# Patient Record
Sex: Female | Born: 1970 | Race: White | Hispanic: No | Marital: Single | Smoking: Never smoker
Health system: Southern US, Community
[De-identification: ages and names within clinical notes are randomized; demographics above are authoritative.]

## PROBLEM LIST (undated history)

## (undated) DIAGNOSIS — M722 Plantar fascial fibromatosis: Secondary | ICD-10-CM

## (undated) DIAGNOSIS — R739 Hyperglycemia, unspecified: Secondary | ICD-10-CM

## (undated) DIAGNOSIS — L408 Other psoriasis: Secondary | ICD-10-CM

## (undated) DIAGNOSIS — F329 Major depressive disorder, single episode, unspecified: Secondary | ICD-10-CM

## (undated) DIAGNOSIS — E139 Other specified diabetes mellitus without complications: Secondary | ICD-10-CM

## (undated) DIAGNOSIS — R519 Headache, unspecified: Secondary | ICD-10-CM

## (undated) DIAGNOSIS — F419 Anxiety disorder, unspecified: Secondary | ICD-10-CM

## (undated) DIAGNOSIS — R87613 High grade squamous intraepithelial lesion on cytologic smear of cervix (HGSIL): Secondary | ICD-10-CM

## (undated) DIAGNOSIS — F411 Generalized anxiety disorder: Secondary | ICD-10-CM

## (undated) DIAGNOSIS — K219 Gastro-esophageal reflux disease without esophagitis: Secondary | ICD-10-CM

## (undated) DIAGNOSIS — E785 Hyperlipidemia, unspecified: Secondary | ICD-10-CM

## (undated) DIAGNOSIS — I10 Essential (primary) hypertension: Secondary | ICD-10-CM

## (undated) DIAGNOSIS — G4730 Sleep apnea, unspecified: Secondary | ICD-10-CM

## (undated) DIAGNOSIS — C4359 Malignant melanoma of other part of trunk: Secondary | ICD-10-CM

## (undated) DIAGNOSIS — R51 Headache: Secondary | ICD-10-CM

## (undated) DIAGNOSIS — F32A Depression, unspecified: Secondary | ICD-10-CM

## (undated) DIAGNOSIS — D500 Iron deficiency anemia secondary to blood loss (chronic): Secondary | ICD-10-CM

## (undated) DIAGNOSIS — E669 Obesity, unspecified: Secondary | ICD-10-CM

## (undated) HISTORY — DX: Essential (primary) hypertension: I10

## (undated) HISTORY — DX: Hyperglycemia, unspecified: R73.9

## (undated) HISTORY — DX: Anxiety disorder, unspecified: F41.9

## (undated) HISTORY — DX: Other psoriasis: L40.8

## (undated) HISTORY — DX: Plantar fascial fibromatosis: M72.2

## (undated) HISTORY — DX: High grade squamous intraepithelial lesion on cytologic smear of cervix (HGSIL): R87.613

## (undated) HISTORY — DX: Depression, unspecified: F32.A

## (undated) HISTORY — DX: Gastro-esophageal reflux disease without esophagitis: K21.9

## (undated) HISTORY — DX: Obesity, unspecified: E66.9

## (undated) HISTORY — DX: Major depressive disorder, single episode, unspecified: F32.9

## (undated) HISTORY — DX: Sleep apnea, unspecified: G47.30

## (undated) HISTORY — DX: Headache: R51

## (undated) HISTORY — DX: Malignant melanoma of other part of trunk: C43.59

## (undated) HISTORY — DX: Generalized anxiety disorder: F41.1

## (undated) HISTORY — DX: Other specified diabetes mellitus without complications: E13.9

## (undated) HISTORY — DX: Hyperlipidemia, unspecified: E78.5

## (undated) HISTORY — DX: Headache, unspecified: R51.9

## (undated) HISTORY — DX: Iron deficiency anemia secondary to blood loss (chronic): D50.0

---

## 1998-10-03 ENCOUNTER — Emergency Department (HOSPITAL_COMMUNITY): Admission: EM | Admit: 1998-10-03 | Discharge: 1998-10-03 | Payer: Self-pay | Admitting: Emergency Medicine

## 1999-04-07 ENCOUNTER — Other Ambulatory Visit: Admission: RE | Admit: 1999-04-07 | Discharge: 1999-04-07 | Payer: Self-pay | Admitting: Gynecology

## 2002-02-21 ENCOUNTER — Emergency Department (HOSPITAL_COMMUNITY): Admission: EM | Admit: 2002-02-21 | Discharge: 2002-02-21 | Payer: Self-pay | Admitting: Emergency Medicine

## 2003-08-15 ENCOUNTER — Other Ambulatory Visit: Admission: RE | Admit: 2003-08-15 | Discharge: 2003-08-15 | Payer: Self-pay | Admitting: Gynecology

## 2004-12-17 ENCOUNTER — Emergency Department (HOSPITAL_COMMUNITY): Admission: EM | Admit: 2004-12-17 | Discharge: 2004-12-17 | Payer: Self-pay | Admitting: Family Medicine

## 2005-01-04 ENCOUNTER — Emergency Department (HOSPITAL_COMMUNITY): Admission: EM | Admit: 2005-01-04 | Discharge: 2005-01-04 | Payer: Self-pay | Admitting: Family Medicine

## 2005-05-06 ENCOUNTER — Ambulatory Visit: Payer: Self-pay | Admitting: Family Medicine

## 2005-05-07 ENCOUNTER — Ambulatory Visit: Payer: Self-pay | Admitting: *Deleted

## 2005-12-03 ENCOUNTER — Ambulatory Visit: Payer: Self-pay | Admitting: Family Medicine

## 2006-01-21 ENCOUNTER — Emergency Department (HOSPITAL_COMMUNITY): Admission: EM | Admit: 2006-01-21 | Discharge: 2006-01-21 | Payer: Self-pay | Admitting: Family Medicine

## 2006-07-21 ENCOUNTER — Ambulatory Visit: Payer: Self-pay | Admitting: Family Medicine

## 2006-07-21 ENCOUNTER — Encounter (INDEPENDENT_AMBULATORY_CARE_PROVIDER_SITE_OTHER): Payer: Self-pay | Admitting: *Deleted

## 2006-10-14 ENCOUNTER — Ambulatory Visit: Payer: Self-pay | Admitting: Family Medicine

## 2006-11-01 ENCOUNTER — Ambulatory Visit: Payer: Self-pay | Admitting: Family Medicine

## 2006-11-21 ENCOUNTER — Encounter (INDEPENDENT_AMBULATORY_CARE_PROVIDER_SITE_OTHER): Payer: Self-pay | Admitting: *Deleted

## 2006-11-21 ENCOUNTER — Ambulatory Visit: Payer: Self-pay | Admitting: Family Medicine

## 2006-12-19 ENCOUNTER — Ambulatory Visit: Payer: Self-pay | Admitting: Family Medicine

## 2007-01-05 ENCOUNTER — Ambulatory Visit (HOSPITAL_COMMUNITY): Admission: RE | Admit: 2007-01-05 | Discharge: 2007-01-05 | Payer: Self-pay | Admitting: Family Medicine

## 2007-02-23 ENCOUNTER — Ambulatory Visit: Payer: Self-pay | Admitting: Obstetrics & Gynecology

## 2007-02-23 ENCOUNTER — Other Ambulatory Visit: Admission: RE | Admit: 2007-02-23 | Discharge: 2007-02-23 | Payer: Self-pay | Admitting: Obstetrics and Gynecology

## 2007-03-09 ENCOUNTER — Ambulatory Visit: Payer: Self-pay | Admitting: *Deleted

## 2007-04-27 ENCOUNTER — Emergency Department (HOSPITAL_COMMUNITY): Admission: EM | Admit: 2007-04-27 | Discharge: 2007-04-27 | Payer: Self-pay | Admitting: Family Medicine

## 2007-06-15 ENCOUNTER — Ambulatory Visit: Payer: Self-pay | Admitting: Gynecology

## 2007-06-15 ENCOUNTER — Encounter (INDEPENDENT_AMBULATORY_CARE_PROVIDER_SITE_OTHER): Payer: Self-pay | Admitting: Gynecology

## 2007-06-29 ENCOUNTER — Ambulatory Visit: Payer: Self-pay | Admitting: Gynecology

## 2007-08-18 ENCOUNTER — Telehealth (INDEPENDENT_AMBULATORY_CARE_PROVIDER_SITE_OTHER): Payer: Self-pay | Admitting: *Deleted

## 2007-08-22 ENCOUNTER — Ambulatory Visit: Payer: Self-pay | Admitting: Family Medicine

## 2007-08-22 DIAGNOSIS — I10 Essential (primary) hypertension: Secondary | ICD-10-CM | POA: Insufficient documentation

## 2007-08-22 DIAGNOSIS — F411 Generalized anxiety disorder: Secondary | ICD-10-CM

## 2007-08-22 DIAGNOSIS — K219 Gastro-esophageal reflux disease without esophagitis: Secondary | ICD-10-CM | POA: Insufficient documentation

## 2007-08-22 DIAGNOSIS — L408 Other psoriasis: Secondary | ICD-10-CM

## 2007-08-22 DIAGNOSIS — R87613 High grade squamous intraepithelial lesion on cytologic smear of cervix (HGSIL): Secondary | ICD-10-CM | POA: Insufficient documentation

## 2007-08-22 HISTORY — DX: Other psoriasis: L40.8

## 2007-08-22 HISTORY — DX: Essential (primary) hypertension: I10

## 2007-08-22 HISTORY — DX: Generalized anxiety disorder: F41.1

## 2007-08-22 HISTORY — DX: Gastro-esophageal reflux disease without esophagitis: K21.9

## 2007-08-22 HISTORY — DX: High grade squamous intraepithelial lesion on cytologic smear of cervix (HGSIL): R87.613

## 2007-08-23 ENCOUNTER — Encounter (INDEPENDENT_AMBULATORY_CARE_PROVIDER_SITE_OTHER): Payer: Self-pay | Admitting: *Deleted

## 2007-09-13 ENCOUNTER — Ambulatory Visit: Payer: Self-pay | Admitting: Obstetrics & Gynecology

## 2007-10-05 ENCOUNTER — Ambulatory Visit: Payer: Self-pay | Admitting: Obstetrics & Gynecology

## 2007-10-05 ENCOUNTER — Other Ambulatory Visit: Admission: RE | Admit: 2007-10-05 | Discharge: 2007-10-05 | Payer: Self-pay | Admitting: Obstetrics and Gynecology

## 2007-10-19 ENCOUNTER — Ambulatory Visit: Payer: Self-pay | Admitting: Obstetrics and Gynecology

## 2007-12-07 HISTORY — PX: LEEP: SHX91

## 2008-01-24 ENCOUNTER — Ambulatory Visit: Payer: Self-pay | Admitting: Family Medicine

## 2008-01-24 DIAGNOSIS — C4359 Malignant melanoma of other part of trunk: Secondary | ICD-10-CM

## 2008-01-24 HISTORY — DX: Malignant melanoma of other part of trunk: C43.59

## 2008-01-24 LAB — CONVERTED CEMR LAB
ALT: 27 units/L (ref 0–35)
AST: 20 units/L (ref 0–37)
Albumin: 4.5 g/dL (ref 3.5–5.2)
Alkaline Phosphatase: 56 units/L (ref 39–117)
Basophils Absolute: 0 10*3/uL (ref 0.0–0.1)
Basophils Relative: 0 % (ref 0–1)
CO2: 25 meq/L (ref 19–32)
Calcium: 9.7 mg/dL (ref 8.4–10.5)
Chloride: 104 meq/L (ref 96–112)
Creatinine, Ser: 0.57 mg/dL (ref 0.40–1.20)
Eosinophils Absolute: 0.2 10*3/uL (ref 0.0–0.7)
Eosinophils Relative: 2 % (ref 0–5)
HCT: 42.9 % (ref 36.0–46.0)
HDL: 60 mg/dL (ref >39)
Hemoglobin: 13.8 g/dL (ref 12.0–15.0)
MCHC: 32.2 g/dL (ref 30.0–36.0)
MCV: 97.3 fL (ref 78.0–100.0)
Platelets: 295 10*3/uL (ref 150–400)
RBC: 4.41 M/uL (ref 3.87–5.11)
Sodium: 140 meq/L (ref 135–145)
TSH: 1.623 microintl units/mL (ref 0.350–5.50)
Total CHOL/HDL Ratio: 3.4
Triglycerides: 101 mg/dL (ref <150)
VLDL: 20 mg/dL (ref 0–40)
WBC: 6.4 10*3/uL (ref 4.0–10.5)

## 2008-02-06 ENCOUNTER — Emergency Department (HOSPITAL_COMMUNITY): Admission: EM | Admit: 2008-02-06 | Discharge: 2008-02-06 | Payer: Self-pay | Admitting: Family Medicine

## 2008-02-14 ENCOUNTER — Telehealth (INDEPENDENT_AMBULATORY_CARE_PROVIDER_SITE_OTHER): Payer: Self-pay | Admitting: Family Medicine

## 2008-03-12 ENCOUNTER — Ambulatory Visit: Payer: Self-pay | Admitting: Family Medicine

## 2008-03-18 ENCOUNTER — Telehealth (INDEPENDENT_AMBULATORY_CARE_PROVIDER_SITE_OTHER): Payer: Self-pay | Admitting: Family Medicine

## 2008-03-22 ENCOUNTER — Ambulatory Visit: Payer: Self-pay | Admitting: Nurse Practitioner

## 2008-03-22 DIAGNOSIS — J019 Acute sinusitis, unspecified: Secondary | ICD-10-CM

## 2008-04-04 ENCOUNTER — Ambulatory Visit: Payer: Self-pay | Admitting: Obstetrics and Gynecology

## 2008-04-04 ENCOUNTER — Encounter (INDEPENDENT_AMBULATORY_CARE_PROVIDER_SITE_OTHER): Payer: Self-pay | Admitting: Family Medicine

## 2008-04-17 ENCOUNTER — Telehealth (INDEPENDENT_AMBULATORY_CARE_PROVIDER_SITE_OTHER): Payer: Self-pay | Admitting: Nurse Practitioner

## 2008-05-21 ENCOUNTER — Encounter (INDEPENDENT_AMBULATORY_CARE_PROVIDER_SITE_OTHER): Payer: Self-pay | Admitting: Family Medicine

## 2008-05-30 ENCOUNTER — Telehealth (INDEPENDENT_AMBULATORY_CARE_PROVIDER_SITE_OTHER): Payer: Self-pay | Admitting: Family Medicine

## 2008-06-04 ENCOUNTER — Telehealth (INDEPENDENT_AMBULATORY_CARE_PROVIDER_SITE_OTHER): Payer: Self-pay | Admitting: Family Medicine

## 2008-06-12 ENCOUNTER — Encounter: Payer: Self-pay | Admitting: Obstetrics and Gynecology

## 2008-06-12 ENCOUNTER — Ambulatory Visit: Payer: Self-pay | Admitting: Obstetrics and Gynecology

## 2008-07-01 ENCOUNTER — Ambulatory Visit: Payer: Self-pay | Admitting: Family Medicine

## 2008-07-01 DIAGNOSIS — M722 Plantar fascial fibromatosis: Secondary | ICD-10-CM

## 2008-07-01 HISTORY — DX: Plantar fascial fibromatosis: M72.2

## 2008-07-02 ENCOUNTER — Encounter (INDEPENDENT_AMBULATORY_CARE_PROVIDER_SITE_OTHER): Payer: Self-pay | Admitting: Family Medicine

## 2008-07-09 ENCOUNTER — Ambulatory Visit: Payer: Self-pay | Admitting: Family Medicine

## 2008-07-16 ENCOUNTER — Encounter (INDEPENDENT_AMBULATORY_CARE_PROVIDER_SITE_OTHER): Payer: Self-pay | Admitting: Family Medicine

## 2008-09-02 ENCOUNTER — Ambulatory Visit: Payer: Self-pay | Admitting: Family Medicine

## 2008-10-08 ENCOUNTER — Ambulatory Visit: Payer: Self-pay | Admitting: Family Medicine

## 2008-11-21 ENCOUNTER — Telehealth (INDEPENDENT_AMBULATORY_CARE_PROVIDER_SITE_OTHER): Payer: Self-pay | Admitting: *Deleted

## 2008-12-06 HISTORY — PX: SKIN CANCER EXCISION: SHX779

## 2008-12-26 ENCOUNTER — Encounter: Payer: Self-pay | Admitting: Obstetrics and Gynecology

## 2008-12-26 ENCOUNTER — Ambulatory Visit: Payer: Self-pay | Admitting: Obstetrics and Gynecology

## 2008-12-26 LAB — CONVERTED CEMR LAB
Hemoglobin: 12.4 g/dL (ref 12.0–15.0)
MCV: 86.2 fL (ref 78.0–100.0)
Platelets: 345 10*3/uL (ref 150–400)
RBC: 4.28 M/uL (ref 3.87–5.11)
WBC: 7.6 10*3/uL (ref 4.0–10.5)

## 2008-12-30 ENCOUNTER — Ambulatory Visit: Payer: Self-pay | Admitting: Family Medicine

## 2008-12-30 DIAGNOSIS — K089 Disorder of teeth and supporting structures, unspecified: Secondary | ICD-10-CM | POA: Insufficient documentation

## 2008-12-31 ENCOUNTER — Ambulatory Visit (HOSPITAL_COMMUNITY): Admission: RE | Admit: 2008-12-31 | Discharge: 2008-12-31 | Payer: Self-pay | Admitting: Family Medicine

## 2009-01-09 ENCOUNTER — Ambulatory Visit: Payer: Self-pay | Admitting: Obstetrics and Gynecology

## 2009-06-10 ENCOUNTER — Encounter (INDEPENDENT_AMBULATORY_CARE_PROVIDER_SITE_OTHER): Payer: Self-pay | Admitting: Family Medicine

## 2009-06-10 ENCOUNTER — Ambulatory Visit: Payer: Self-pay | Admitting: Internal Medicine

## 2009-06-12 ENCOUNTER — Ambulatory Visit: Payer: Self-pay | Admitting: Obstetrics and Gynecology

## 2010-01-21 ENCOUNTER — Ambulatory Visit: Payer: Self-pay | Admitting: Obstetrics and Gynecology

## 2010-01-21 LAB — CONVERTED CEMR LAB: Pap Smear: NEGATIVE

## 2010-07-27 ENCOUNTER — Ambulatory Visit: Payer: Self-pay | Admitting: Obstetrics & Gynecology

## 2010-11-01 ENCOUNTER — Emergency Department (HOSPITAL_COMMUNITY)
Admission: EM | Admit: 2010-11-01 | Discharge: 2010-11-01 | Payer: Self-pay | Source: Home / Self Care | Admitting: Family Medicine

## 2010-12-27 ENCOUNTER — Encounter: Payer: Self-pay | Admitting: *Deleted

## 2011-03-11 ENCOUNTER — Ambulatory Visit: Payer: Self-pay | Admitting: Advanced Practice Midwife

## 2011-04-12 ENCOUNTER — Other Ambulatory Visit (HOSPITAL_COMMUNITY)
Admission: RE | Admit: 2011-04-12 | Discharge: 2011-04-12 | Disposition: A | Discharge status: Home / Self Care | Payer: Self-pay | Source: Ambulatory Visit | Attending: Obstetrics & Gynecology | Admitting: Obstetrics & Gynecology

## 2011-04-12 ENCOUNTER — Other Ambulatory Visit: Payer: Self-pay | Admitting: Advanced Practice Midwife

## 2011-04-12 ENCOUNTER — Ambulatory Visit (INDEPENDENT_AMBULATORY_CARE_PROVIDER_SITE_OTHER): Payer: Self-pay | Admitting: Advanced Practice Midwife

## 2011-04-12 DIAGNOSIS — N949 Unspecified condition associated with female genital organs and menstrual cycle: Secondary | ICD-10-CM

## 2011-04-12 DIAGNOSIS — Z01419 Encounter for gynecological examination (general) (routine) without abnormal findings: Secondary | ICD-10-CM | POA: Insufficient documentation

## 2011-04-13 NOTE — Group Therapy Note (Unsigned)
NAME:  Janet [name], Janet [name] NO.:  [id number]  MEDICAL RECORD NO.:  [id number]           PATIENT TYPE:  A  LOCATION:  [facility]                   FACILITY:  [facility]  PHYSICIAN:  Wynelle Bourgeois, CNM    DATE OF BIRTH:  1971-08-15  DATE OF SERVICE:  04/12/2011                                 CLINIC NOTE  HISTORY OF PRESENT ILLNESS:  This is a 40 year old gravida 1, para 1 female who is here today for her annual exam.  She has been seen previously for pelvic pain.  Her last visit for that was in August 2011 where Dr. Claudius Sis saw her and ordered her an ultrasound to evaluate her pelvis.  She had previously also had problems with menorrhagia and was placed on oral contraceptives by Dr. Okey Dupre and has had good results from that.  Her bleeding has lessened but she continues to have chronic pain every day throughout the month.  She did not go for her ultrasound in August, because she felt like she had just had one and did not need another one.  She reports normal bowel movements and no constipation or diarrhea.  She has no fever or other symptoms.  ALLERGIES:  PENICILLIN.  LMP April 05, 2011.  PHYSICAL EXAMINATION:  VITAL SIGNS:  Temperature is 98.4, pulse 96, blood pressure 153/101, and weight 259.6. CARDIOVASCULAR:  Regular rate and rhythm with no ectopy. LUNGS:  Clear to auscultation bilaterally. ABDOMEN:  Soft with mild tenderness in the lower abdomen bilaterally. There are no masses appreciated. GU:  EGBUS within normal limits.  Vagina clear without lesions.  Cervix is closed and located in the upper right area of her vagina.  It is somewhat difficult to access.  Uterus is small and firm with no masses or tenderness.  Bilateral adnexa are not palpable secondary to habitus.  IMPRESSION AND PLAN: 41. A 40 year old female with menorrhagia, improved on OCPs. 2. Chronic pelvic pain.  We will reorder a pelvic ultrasound for     further evaluation.  Discussed patient's request  for possible     surgery either a laparoscopy to evaluate for endometriosis or     hysterectomy.  The patient is not sure if she wants to proceed with     these and we will need to apply for scholarship for finding.  I     discussed with her that she will need to speak with the physician     about whether or not these options would be reasonable for her.  I     asked her to quantify how debilitating her pain is and she could     not really do that. 3. History of abnormal Pap with LEEP surgery in 2008 and normal Pap     since then.  So Pap was sent today. 4. The patient declines STD testing. 5. Return in 2-3 weeks after ultrasound for evaluation of results and     discussion of possible future treatment.         ______________________________ Wynelle Bourgeois, CNM   MW/MEDQ  D:  04/12/2011  T:  04/13/2011  Job:  [id number]

## 2011-04-20 NOTE — Group Therapy Note (Signed)
NAME:  Janet [name], Janet [name] NO.:  [id number]   MEDICAL RECORD NO.:  [id number]          PATIENT TYPE:  WOC   LOCATION[facility]                   FACILITY:  [facility]   PHYSICIAN:  Argentina Donovan, MD        DATE OF BIRTH:  01-Feb-1971   DATE OF SERVICE:  12/26/2008                                  CLINIC NOTE   The patient is a 40 year old Caucasian female gravida 1, para 1-0-0-1  with a daughter age 5.  She is now presently not sexually active and  has not been for some time.  She stated that if she were to meet someone  to become involved she would not want any further children.  She comes  in today because since her LEEP in October of 2008 she has had extremely  heavy periods where she passes clots the size of golf balls.  It is not  every month but it is very frequent.  The patient gained quite a bit of  weight, she weighs 271 pounds at 5 feet 6 inches tall.  Her blood  pressure is 130/79.  Her pulse is 88 per minute.  The patient had a LEEP  for cervical dysplasia and her last Pap smear in July was ASCUS but no  reflex viral search was done so we repeated her Pap smear today.  On  examination, it was nonrevealing as far as her bleeding goes.  We are  going to get an ultrasound, get a CBC, have her come back in 2 weeks,  and then talk to her about hormonal treatment, a Mirena IUD versus  endometrial ablation.  She is not a smoker and has never been a smoker.   IMPRESSION:  1. Previous cervical dysplasia, Pap smear repeated.  2. Long-term menorrhagia, rule out anemia.           ______________________________  Argentina Donovan, MD     PR/MEDQ  D:  12/26/2008  T:  12/26/2008  Job:  [id number]

## 2011-04-20 NOTE — Group Therapy Note (Signed)
NAME:  Janet [name], Janet [name] NO.:  [id number]   MEDICAL RECORD NO.:  [id number]          PATIENT TYPE:  WOC   [facility]                   [facility]ACILITY[facility]   PHYSICIAN:  Argentina Donovan, MD        DATE OF BIRTH:  Oct 18, 1971   DATE OF SERVICE:                                  CLINIC NOTE   HISTORY:  The patient is a 40 year old white female gravida 1, para 1-0-  0-1 who had a LEEP done in  2008 and Pap smears since that time have  been normal.  However for a long period of time, she has had chronic  menometrorrhagia.  She now spots almost every day if she does not bleed.  Ultrasound previously was normal with a thin endometrium.  We talked  about the Mirena IUD versus endometrial ablation.  She applied for the  scholarship for the Mirena IUD.  They told her income was too high.  So  we talked about the possibility of either trying through the health  department or going for an endometrial ablation.  She is going to try  and get the financial help through the hospital for the endometrial  ablation. In the interim, we are going to try her at least.  She is a  nonsmoker on Loestrin FE and see if we can control the bleeding using  that hormone treatment.  We have talked about the Depo-Provera.  I  hesitate to give her that because of her weight which is 274 pounds.  With the exception of her problem with bleeding, she is in pretty good  health.  She does take Nexium daily for reflux, but takes almost no  other medication.  She constantly has to wear a pad.  She sometimes  bleeds heavily.  When she is not bleeding, she almost always is spotting  and that has been going on for several years.   IMPRESSION:  Chronic menometrorrhagia.           ______________________________  Argentina Donovan, MD     PR/MEDQ  D:  06/12/2009  T:  06/12/2009  Job:  [id number]

## 2011-04-20 NOTE — Group Therapy Note (Signed)
NAME:  SAVANNAHA, STONEROCK NO.:  [id number]   MEDICAL RECORD NO.:  [id number]          PATIENT TYPE:  WOC   [facility]                   [facility]ILITY[facility]   PHYSICIAN:  Argentina Donovan, MD        DATE OF BIRTH:  01-08-1971   DATE OF SERVICE:                                  CLINIC NOTE   The patient is a 40 year old gravida 1, para 1-0-0-1, please refer to  previous note.  She wants no more children.  We have evaluated her.  Her  Pap smear and LEEP has been normal.  Her CBC is normal.  Her ultrasound  is normal and has never been a smoker.  We discussed the various methods  of evaluating her and controlling her bleeding and I have discussed  specifically the Mirena IUD versus the endometrial ablation.  We will  give her information on both.  We are going to refill out the papers for  the IUD and then at her home and make a decision and let us know what  she wants to do.   IMPRESSION:  Chronic menorrhagia.           ______________________________  Argentina Donovan, MD     PR/MEDQ  D:  01/09/2009  T:  01/10/2009  Job:  [id number]

## 2011-04-22 ENCOUNTER — Ambulatory Visit (HOSPITAL_COMMUNITY): Admission: RE | Admit: 2011-04-22 | Payer: Self-pay | Source: Ambulatory Visit

## 2011-04-22 ENCOUNTER — Other Ambulatory Visit (HOSPITAL_COMMUNITY): Payer: Self-pay

## 2011-04-29 ENCOUNTER — Ambulatory Visit (HOSPITAL_COMMUNITY)
Admission: RE | Admit: 2011-04-29 | Discharge: 2011-04-29 | Disposition: A | Discharge status: Home / Self Care | Payer: Self-pay | Source: Ambulatory Visit | Attending: Advanced Practice Midwife | Admitting: Advanced Practice Midwife

## 2011-04-29 DIAGNOSIS — N949 Unspecified condition associated with female genital organs and menstrual cycle: Secondary | ICD-10-CM | POA: Insufficient documentation

## 2011-04-29 DIAGNOSIS — N838 Other noninflammatory disorders of ovary, fallopian tube and broad ligament: Secondary | ICD-10-CM | POA: Insufficient documentation

## 2011-05-20 ENCOUNTER — Ambulatory Visit (INDEPENDENT_AMBULATORY_CARE_PROVIDER_SITE_OTHER): Payer: Self-pay | Admitting: Obstetrics & Gynecology

## 2011-05-20 DIAGNOSIS — N949 Unspecified condition associated with female genital organs and menstrual cycle: Secondary | ICD-10-CM

## 2011-05-21 NOTE — Group Therapy Note (Unsigned)
NAME:  Janet [name], MATUSIK NO.:  [id number]  MEDICAL RECORD NO.:  [id number]           PATIENT TYPE:  A  [facility]                   [facility]ILITY[facility]  PHYSICIAN:  Jaynie Collins, MD     DATE OF BIRTH:  [birth year]  DATE OF SERVICE:  05/21/2011                                 CLINIC NOTE  HISTORY OF PRESENT ILLNESS:  The patient is a 40 year old, gravida 1, para 1 female, here today for follow-up of complaint of pelvic pain. The patient was last seen on Apr 12, 2011, and was referred for an ultrasound.  According to her note, she asked about laparoscopy to evaluate for endometriosis based on the ultrasound findings.  The patient returns to clinic today with similar complaints of pain.  She states it is a 4/10 lower abdominal pain, worse in the left lower quadrant and right lower quadrant.  She states she feels this pain constantly, worse in the morning upon awakening, and she has had this constant pain for the last 2 years.  She describes it is like a cramping pain.  The patient was put on combination OCPs 2 years ago for dysmenorrhea.  She states that the pain could be related to these and wanted to try a different kind of OCPs.  She reports normal bowel movements.  No constipation or diarrhea, and does not report any dysuria.  She states she is sexually active with one partner.  Denies dyspareunia.  ALLERGIES:  PENICILLIN.  Last menstrual period, May 2012.  PHYSICAL EXAMINATION:  VITAL SIGNS:  Temperature 98.4, pulse 90, blood pressure 150/94, weight 258.2, height 6 feet 5.5 inches.  IMPRESSION AND PLAN: 72. A 40 year old female with chronic pelvic pain.  Discuss the patient     that laparoscopy surgery for evaluation of endometriosis.  The     patient would like to try searching birth control methods first.     The patient has had multiple elevated blood pressures in clinic,     today was 150/94, in Apr 12, 2011 it was 153/101.  The patient was  explained the risks to have any uncontrolled hypertension and     taking estrogen containing pills such as stroke and thromboembolic     events.  The patient was last given a prescription for Provera and     HCTZ and told to follow up with her primary care provider. 2. The patient told to return after trial of new birth control methods     to see if further testing for endometriosis is needed.    ______________________________ Emeline Gins, PA   ______________________________ Jaynie Collins, MD   NS/MEDQ  D:  05/20/2011  T:  05/21/2011  Job:  [id number]

## 2011-07-03 ENCOUNTER — Other Ambulatory Visit: Payer: Self-pay | Admitting: Obstetrics & Gynecology

## 2011-08-30 LAB — POCT RAPID STREP A: Streptococcus, Group A Screen (Direct): POSITIVE — AB

## 2011-09-15 LAB — POCT PREGNANCY, URINE: Operator id: 148111

## 2011-09-16 LAB — POCT PREGNANCY, URINE
Operator id: 297281
Preg Test, Ur: NEGATIVE

## 2013-09-27 ENCOUNTER — Ambulatory Visit: Payer: Self-pay | Attending: Internal Medicine

## 2013-10-02 ENCOUNTER — Emergency Department (HOSPITAL_COMMUNITY)
Admission: EM | Admit: 2013-10-02 | Discharge: 2013-10-02 | Disposition: A | Discharge status: Home / Self Care | Payer: Self-pay | Attending: Emergency Medicine | Admitting: Emergency Medicine

## 2013-10-02 ENCOUNTER — Encounter (HOSPITAL_COMMUNITY): Payer: Self-pay | Admitting: Emergency Medicine

## 2013-10-02 ENCOUNTER — Emergency Department (HOSPITAL_COMMUNITY): Payer: Self-pay

## 2013-10-02 DIAGNOSIS — Z79899 Other long term (current) drug therapy: Secondary | ICD-10-CM | POA: Insufficient documentation

## 2013-10-02 DIAGNOSIS — Z880 Allergy status to penicillin: Secondary | ICD-10-CM | POA: Insufficient documentation

## 2013-10-02 DIAGNOSIS — R42 Dizziness and giddiness: Secondary | ICD-10-CM | POA: Insufficient documentation

## 2013-10-02 DIAGNOSIS — R51 Headache: Secondary | ICD-10-CM | POA: Insufficient documentation

## 2013-10-02 DIAGNOSIS — Z3202 Encounter for pregnancy test, result negative: Secondary | ICD-10-CM | POA: Insufficient documentation

## 2013-10-02 LAB — URINALYSIS, ROUTINE W REFLEX MICROSCOPIC: Ketones, ur: 40 mg/dL — AB

## 2013-10-02 LAB — URINE MICROSCOPIC-ADD ON

## 2013-10-02 LAB — PREGNANCY, URINE: Preg Test, Ur: NEGATIVE

## 2013-10-02 MED ORDER — DEXAMETHASONE SODIUM PHOSPHATE 4 MG/ML IJ SOLN
4.0000 mg | Freq: Once | INTRAMUSCULAR | Status: AC
  Administered 2013-10-02: 4 mg via INTRAVENOUS
  Filled 2013-10-02: qty 1

## 2013-10-02 MED ORDER — SODIUM CHLORIDE 0.9 % IV BOLUS (SEPSIS)
1000.0000 mL | Freq: Once | INTRAVENOUS | Status: AC
  Administered 2013-10-02: 1000 mL via INTRAVENOUS

## 2013-10-02 MED ORDER — DIPHENHYDRAMINE HCL 50 MG/ML IJ SOLN
25.0000 mg | Freq: Once | INTRAMUSCULAR | Status: AC
  Administered 2013-10-02: 25 mg via INTRAVENOUS
  Filled 2013-10-02: qty 1

## 2013-10-02 MED ORDER — HALOPERIDOL LACTATE 5 MG/ML IJ SOLN
2.5000 mg | Freq: Once | INTRAMUSCULAR | Status: AC
  Administered 2013-10-02: 2.5 mg via INTRAVENOUS
  Filled 2013-10-02: qty 1

## 2013-10-02 MED ORDER — ONDANSETRON HCL 4 MG/2ML IJ SOLN
4.0000 mg | Freq: Once | INTRAMUSCULAR | Status: AC
  Administered 2013-10-02: 4 mg via INTRAVENOUS
  Filled 2013-10-02: qty 2

## 2013-10-02 NOTE — ED Notes (Addendum)
Pt states she woke up at 0400 this morning with a severe HA. Pt states she feels like she could pass out. Pt states she was nauseous earlier but is not right now. Pt is tearful. Pt denies hx of migraines. Pt is A&O X4. Pt states she feels weak all over. Pt c/o diaphoresis all over since she woke up. Pt states she has full sensation in her extremities. Pt denies any vision changes.

## 2013-10-02 NOTE — ED Provider Notes (Signed)
CSN: [id number]     Arrival date & time 10/02/13  0630 History   First MD Initiated Contact with Patient 10/02/13 [phone]     Chief Complaint  Patient presents with  . Headache   (Consider location/radiation/quality/duration/timing/severity/associated sxs/prior Treatment) Patient is a 42 y.o. female presenting with headaches. The history is provided by the patient. No language interpreter was used.  Headache Pain location:  Generalized Quality:  Sharp Radiates to:  Does not radiate Severity currently:  10/10 Onset quality:  Sudden Chronicity:  New Similar to prior headaches: no   Context comment:  Awakened from sleep Associated symptoms: dizziness   Associated symptoms: no blurred vision, no diarrhea, no pain, no facial pain, no fever, no photophobia, no seizures and no vomiting    Pt is a 42 year old female who was awakened this morning with a headache that she describes as the "worst headache of my life". She denies any history of migraines, fever, chills or recent illness. She denies photophobia, visual changes or difficulty walking. She denies any weakness or focal deficits. She describes this as a generalized headache and has had accompanied dizziness when she stands up and initially felt like she might pass out this morning. She reports that her headache continues to be 10/10.    History reviewed. No pertinent past medical history. History reviewed. No pertinent past surgical history. No family history on file. History  Substance Use Topics  . Smoking status: Never Smoker   . Smokeless tobacco: Not on file  . Alcohol Use: No   OB History   Grav Para Term Preterm Abortions TAB SAB Ect Mult Living                 Review of Systems  Constitutional: Negative for fever and chills.  Eyes: Negative for blurred vision, photophobia and pain.  Gastrointestinal: Negative for vomiting and diarrhea.  Musculoskeletal: Negative for gait problem.  Neurological: Positive for dizziness  and headaches. Negative for seizures, facial asymmetry and weakness.  All other systems reviewed and are negative.    Allergies  Penicillins  Home Medications   Current Outpatient Rx  Name  Route  Sig  Dispense  Refill  . hydrochlorothiazide 25 MG tablet      TAKE ONE TABLET BY MOUTH EVERY DAY   30 tablet   3   . medroxyPROGESTERone (PROVERA) 10 MG tablet      TAKE ONE TABLET BY MOUTH EVERY DAY   30 tablet   2    BP 145/87  Pulse 86  Temp(Src) 97.8 F (36.6 C) (Oral)  Resp 20  Ht 5\' 7"  (1.702 m)  Wt 277 lb (125.646 kg)  BMI 43.37 kg/m2  SpO2 100%  LMP 09/25/2013 Physical Exam  Nursing note and vitals reviewed. Constitutional: She appears well-developed and well-nourished.  HENT:  Head: Normocephalic and atraumatic.  Mouth/Throat: Oropharynx is clear and moist.  Eyes: Conjunctivae and EOM are normal. Pupils are equal, round, and reactive to light.  Neck: Normal range of motion. Neck supple. No thyromegaly present.  Cardiovascular: Normal rate, regular rhythm, normal heart sounds and intact distal pulses.   Pulmonary/Chest: Effort normal and breath sounds normal. No respiratory distress. She has no wheezes.  Abdominal: Soft. Bowel sounds are normal. She exhibits no distension. There is no tenderness.  Musculoskeletal: Normal range of motion.  Lymphadenopathy:    She has no cervical adenopathy.  Neurological: She is alert. She has normal strength. No cranial nerve deficit or sensory deficit. Coordination normal. GCS  eye subscore is 4. GCS verbal subscore is 5. GCS motor subscore is 6.  Skin: Skin is warm and dry.  Psychiatric: She has a normal mood and affect. Her behavior is normal. Judgment and thought content normal.    ED Course  Procedures (including critical care time) Labs Review Labs Reviewed - No data to display Imaging Review No results found.  EKG Interpretation   None     7:53 AM Re-evaluated, headache 8/10. Will continue to monitor and give  IV fluid bolus.  9:31 AM Headache 1/10. Sitting up in bed, alert and conversational. VS stable.   MDM   1. Headache    Resolution of headache after meds and fluids. Afebrile, normal ROM of neck, no nuchal rigidity or signs of meningitis. No visual changes, photophobia or focal deficits. Good motor strength and coordination. Ambulatory in room before discharge. Discussed headache care with pt and reasons to return to ER. Pt is feeling much better and agrees to go home, rest and stay well hydrated today.      Irish Elders, NP 10/02/13 [phone]

## 2013-10-02 NOTE — ED Provider Notes (Signed)
Medical screening examination/treatment/procedure(s) were performed by non-physician practitioner and as supervising physician I was immediately available for consultation/collaboration.    Junius Argyle, MD 10/02/13 1531

## 2013-10-02 NOTE — ED Notes (Signed)
MD at bedside. 

## 2013-10-02 NOTE — ED Notes (Signed)
PA at bedside.

## 2013-11-02 ENCOUNTER — Ambulatory Visit: Payer: Self-pay

## 2013-11-19 ENCOUNTER — Ambulatory Visit: Payer: Self-pay | Admitting: Obstetrics & Gynecology

## 2014-05-30 ENCOUNTER — Encounter: Payer: Self-pay | Admitting: Family Medicine

## 2014-05-30 ENCOUNTER — Ambulatory Visit (INDEPENDENT_AMBULATORY_CARE_PROVIDER_SITE_OTHER): Payer: No Typology Code available for payment source | Admitting: Family Medicine

## 2014-05-30 VITALS — BP 144/98 | HR 83 | Temp 98.4°F | Ht 66.5 in | Wt 277.0 lb

## 2014-05-30 DIAGNOSIS — Z85820 Personal history of malignant melanoma of skin: Secondary | ICD-10-CM

## 2014-05-30 DIAGNOSIS — L408 Other psoriasis: Secondary | ICD-10-CM

## 2014-05-30 DIAGNOSIS — R87613 High grade squamous intraepithelial lesion on cytologic smear of cervix (HGSIL): Secondary | ICD-10-CM

## 2014-05-30 DIAGNOSIS — L409 Psoriasis, unspecified: Secondary | ICD-10-CM

## 2014-05-30 DIAGNOSIS — I10 Essential (primary) hypertension: Secondary | ICD-10-CM

## 2014-05-30 DIAGNOSIS — N946 Dysmenorrhea, unspecified: Secondary | ICD-10-CM

## 2014-05-30 DIAGNOSIS — D500 Iron deficiency anemia secondary to blood loss (chronic): Secondary | ICD-10-CM | POA: Insufficient documentation

## 2014-05-30 DIAGNOSIS — E669 Obesity, unspecified: Secondary | ICD-10-CM

## 2014-05-30 HISTORY — DX: Iron deficiency anemia secondary to blood loss (chronic): D50.0

## 2014-05-30 LAB — LIPID PANEL
CHOL/HDL RATIO: 3
CHOLESTEROL: 198 mg/dL (ref 0–200)
HDL: 60.8 mg/dL (ref >39.00)
LDL Cholesterol: 113 mg/dL — ABNORMAL HIGH (ref 0–99)
NonHDL: 137.2
TRIGLYCERIDES: 120 mg/dL (ref 0.0–149.0)
VLDL: 24 mg/dL (ref 0.0–40.0)

## 2014-05-30 LAB — BASIC METABOLIC PANEL
BUN: 12 mg/dL (ref 6–23)
CO2: 28 mEq/L (ref 19–32)
Calcium: 9.5 mg/dL (ref 8.4–10.5)
Chloride: 103 mEq/L (ref 96–112)
Creatinine, Ser: 0.6 mg/dL (ref 0.4–1.2)
GFR: 112.42 mL/min (ref >60.00)
GLUCOSE: 90 mg/dL (ref 70–99)
POTASSIUM: 4.1 meq/L (ref 3.5–5.1)
SODIUM: 139 meq/L (ref 135–145)

## 2014-05-30 LAB — CBC WITH DIFFERENTIAL/PLATELET
BASOS PCT: 0.4 % (ref 0.0–3.0)
Basophils Absolute: 0 10*3/uL (ref 0.0–0.1)
EOS PCT: 1.3 % (ref 0.0–5.0)
Eosinophils Absolute: 0.1 10*3/uL (ref 0.0–0.7)
HCT: 37 % (ref 36.0–46.0)
HEMOGLOBIN: 12.2 g/dL (ref 12.0–15.0)
LYMPHS PCT: 27.8 % (ref 12.0–46.0)
Lymphs Abs: 2.5 10*3/uL (ref 0.7–4.0)
MCHC: 32.9 g/dL (ref 30.0–36.0)
MCV: 86.3 fl (ref 78.0–100.0)
Monocytes Absolute: 0.6 10*3/uL (ref 0.1–1.0)
Monocytes Relative: 6.4 % (ref 3.0–12.0)
NEUTROS ABS: 5.8 10*3/uL (ref 1.4–7.7)
Neutrophils Relative %: 64.1 % (ref 43.0–77.0)
Platelets: 349 10*3/uL (ref 150.0–400.0)
RBC: 4.29 Mil/uL (ref 3.87–5.11)
RDW: 16.2 % — ABNORMAL HIGH (ref 11.5–15.5)
WBC: 9.1 10*3/uL (ref 4.0–10.5)

## 2014-05-30 LAB — HEMOGLOBIN A1C: Hgb A1c MFr Bld: 6 % (ref 4.6–6.5)

## 2014-05-30 LAB — TSH: TSH: 1.55 u[IU]/mL (ref 0.35–4.50)

## 2014-05-30 NOTE — Progress Notes (Signed)
Pre visit review using our clinic review tool, if applicable. No additional management support is needed unless otherwise documented below in the visit note. 

## 2014-05-30 NOTE — Progress Notes (Addendum)
No chief complaint on file.   HPI:  Janet [name] is here to establish care. Never had family doctor. She wants to check for diabetes as multiple family members and she feels tired frequently. Also has heavy menstrual bleeding and dysmenorrhea with pelvic pain for > 10 years with extensive workup per her report. Hx of LEEP. Last PCP and physical: has been doing free female g[facility] exams [facility] at [facility] - have been normal  Has the following chronic problems and concerns today:  Patient Active Problem List   Diagnosis Date Noted  . Anemia due to chronic blood loss - reports history of evaluation and told due to menorrhagia 05/30/2014  . 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 ANXIETY 08/22/2007  . HYPERTENSION, BENIGN ESSENTIAL 08/22/2007  . GERD 08/22/2007  . MICROSCOPIC HEMATURIA 08/22/2007  . PSORIASIS 08/22/2007  . ABFND PAP SMEAR HGSIL 08/22/2007   HTN: -reports prescribed medication in the past -does not takes this anymore -no cv exercise, diet is poor  Dysmenorrhea/hx abnormal pap/pelvic pain: -she wants to see private gyn practice for this -period monthly, heavy bleeding with large clots, fatigue  Anemia: -hx of determined to be heavy menstrual bleeding per her report, takes iron  GERD: -frequent, not taking any medication currently -denies weight loss, trouble swallowing, abd pain  Hx of Melanoma and psoriasis: -did used to go every year for evaluation, but has not been since last summer -she reports no changing or new moles   Health Maintenance:  ROS: See pertinent positives and negatives per HPI.  Past Medical History  Diagnosis Date  . Frequent headaches   . Skin cancer of anterior chest   . History of abnormal cervical Pap smear   . HYPERTENSION, BENIGN ESSENTIAL 08/22/2007    Qualifier: Diagnosis of  By: Radene Ou MD, [place]    . GERD 08/22/2007    Qualifier: Diagnosis of  By: Radene Ou MD, [place]    . MELANOMA, TRUNK 01/24/2008    Qualifier: Diagnosis of  By:  Radene Ou MD, [place]    . PLANTAR FASCIITIS, RIGHT 07/01/2008    Qualifier: Diagnosis of  By: Radene Ou MD, [place]    . PSORIASIS 08/22/2007    Qualifier: Diagnosis of  By: Radene Ou MD, [place]    . ABFND PAP SMEAR HGSIL 08/22/2007    Qualifier: Diagnosis of  By: Radene Ou MD, [place]    . ANXIETY 08/22/2007    Qualifier: Diagnosis of  By: Radene Ou MD, [place]    . Anemia due to chronic blood loss - reports history of evaluation and told due to menorrhagia 05/30/2014    Family History  Problem Relation Age of Onset  . Diabetes Mother   . Hypertension Mother   . Heart disease Father   . Hypertension Father   . Diabetes Brother   . Hypertension Brother     History   Social History  . Marital Status: Single    Spouse Name: N/A    Number of Children: N/A  . Years of Education: N/A   Social History Main Topics  . Smoking status: Never Smoker   . Smokeless tobacco: None  . Alcohol Use: No  . Drug Use: No  . Sexual Activity: No   Other Topics Concern  . None   Social History Narrative   Work or School: daycare in infant room      Home Situation: lives alone      Spiritual Beliefs: none      Lifestyle: no regular exercise; diet is poor  Current outpatient prescriptions:Ferrous Sulfate 27 MG TABS, Take by mouth daily., Disp: , Rfl:   EXAM:  Filed Vitals:   05/30/14 0934  BP: 144/98  Pulse: 83  Temp: 98.4 F (36.9 C)    Body mass index is 44.04 kg/(m^2).  GENERAL: vitals reviewed and listed above, alert, oriented, appears well hydrated and in no acute distress  HEENT: atraumatic, conjunttiva clear, no obvious abnormalities on inspection of external nose and ears  NECK: no obvious masses on inspection  LUNGS: clear to auscultation bilaterally, no wheezes, rales or rhonchi, good air movement  CV: HRRR, no peripheral edema  MS: moves all extremities without noticeable abnormality  PSYCH: pleasant and cooperative, no obvious depression or  anxiety  ASSESSMENT AND PLAN:  Discussed the following assessment and plan:  HYPERTENSION, BENIGN ESSENTIAL - Plan: Basic metabolic panel  ABFND PAP SMEAR HGSIL - Plan: Ambulatory referral to Gynecology  Dysmenorrhea - Plan: Ambulatory referral to Gynecology, TSH  Anemia due to chronic blood loss - reports history of evaluation and told due to menorrhagia - Plan: Ambulatory referral to Gynecology, CBC with Differential  Obesity - Plan: Lipid Panel, Hemoglobin A1c  History of melanoma - Plan: Ambulatory referral to Dermatology  Psoriasis - Plan: Ambulatory referral to Dermatology  -We reviewed the PMH, PSH, FH, SH, Meds and Allergies. -We provided refills for any medications we will prescribe as needed. -We addressed current concerns per orders and patient instructions. -We have asked for records for pertinent exams, studies, vaccines and notes from previous providers. -We have advised patient to follow up per instructions below. -FASTING labs   -Patient advised to return or notify a doctor immediately if symptoms worsen or persist or new concerns arise.  Patient Instructions  -We have ordered labs or studies at this visit. It can take up to 1-2 weeks for results and processing. We will contact you with instructions IF your results are abnormal. Normal results will be released to your [facility] If you have not heard from [place] or can not find your results in [facility] in 2 weeks please contact our office.  -We placed a referral for you as discussed to the dermatologist and the gynecologist. It usually takes about 1-2 weeks to process and schedule this referral. If you have not heard from [place] regarding this appointment in 2 weeks please contact our office.  -We have ordered labs or studies at this visit. It can take up to 1-2 weeks for results and processing. We will contact you with instructions IF your results are abnormal. Normal results will be released to your [facility]. If you have not  heard from [place] or can not find your results in [facility] in 2 weeks please contact our office.  -PLEASE SIGN UP FOR MYCHART TODAY   We recommend the following healthy lifestyle measures: - eat a healthy diet consisting of lots of vegetables, fruits, beans, nuts, seeds, healthy meats such as white chicken and fish and whole grains.  - avoid fried foods, fast food, processed foods, sodas, red meet and other fattening foods.  - get a least 150 minutes of aerobic exercise per week.   Follow up in: 1 month to recheck blood pressure and follow up on labs           [place], [place]

## 2014-05-30 NOTE — Patient Instructions (Signed)
-  We have ordered labs or studies at this visit. It can take up to 1-2 weeks for results and processing. We will contact you with instructions IF your results are abnormal. Normal results will be released to your [facility]. If you have not heard from [place] or can not find your results in [facility] in 2 weeks please contact our office.  -We placed a referral for you as discussed to the dermatologist and the gynecologist. It usually takes about 1-2 weeks to process and schedule this referral. If you have not heard from [place] regarding this appointment in 2 weeks please contact our office.  -We have ordered labs or studies at this visit. It can take up to 1-2 weeks for results and processing. We will contact you with instructions IF your results are abnormal. Normal results will be released to your [facility]. If you have not heard from [place] or can not find your results in [facility] in 2 weeks please contact our office.  -PLEASE SIGN UP FOR MYCHART TODAY   We recommend the following healthy lifestyle measures: - eat a healthy diet consisting of lots of vegetables, fruits, beans, nuts, seeds, healthy meats such as white chicken and fish and whole grains.  - avoid fried foods, fast food, processed foods, sodas, red meet and other fattening foods.  - get a least 150 minutes of aerobic exercise per week.   Follow up in: 1 month to recheck blood pressure and follow up on labs

## 2014-05-31 ENCOUNTER — Telehealth: Payer: Self-pay | Admitting: Family Medicine

## 2014-05-31 ENCOUNTER — Telehealth: Payer: Self-pay | Admitting: *Deleted

## 2014-05-31 MED ORDER — OMEPRAZOLE 20 MG PO CPDR: 20.0000 mg | DELAYED_RELEASE_CAPSULE | Freq: Every day | ORAL | Status: DC

## 2014-05-31 NOTE — Telephone Encounter (Signed)
Rx was sent to the pts pharmacy and I left a detailed message at the pts cell number with this information.

## 2014-05-31 NOTE — Telephone Encounter (Signed)
Patient complains of heartburn every day and states she forgot to ask Dr Maudie Mercury what she could take for this at her appt yesterday.  Please call pt at her cell number and she stated a detailed message can be left at this number.

## 2014-05-31 NOTE — Telephone Encounter (Signed)
prilosec 20mg  daily and follow up in 1 month

## 2014-05-31 NOTE — Telephone Encounter (Signed)
Relevant patient education assigned to patient using Emmi. ° °

## 2014-06-06 ENCOUNTER — Telehealth: Payer: Self-pay | Admitting: Gynecology

## 2014-06-06 NOTE — Telephone Encounter (Signed)
lmtcb to schedule new patient doctor referral.

## 2014-06-11 NOTE — Telephone Encounter (Signed)
Scheduled

## 2014-06-21 ENCOUNTER — Encounter: Payer: Self-pay | Admitting: *Deleted

## 2014-06-25 ENCOUNTER — Telehealth: Payer: Self-pay | Admitting: Gynecology

## 2014-06-25 NOTE — Telephone Encounter (Signed)
Patient called and left a message on the lunch answering machine cancelling her new patient doctor referral appointment for 8:30 AM tomorrow. She did not leave a message as to why she is cancelling. I called her to try and get more information but had to leave a message. Patient was made aware upon scheduling we require 24 notice for cancellations.

## 2014-06-26 ENCOUNTER — Ambulatory Visit: Payer: No Typology Code available for payment source | Admitting: Gynecology

## 2014-06-26 NOTE — Telephone Encounter (Signed)
lmtcb/Jess[place] °

## 2014-06-26 NOTE — Telephone Encounter (Signed)
Spoke with patient. She cancelled due to being sent home from work sick with vomiting.

## 2014-06-28 ENCOUNTER — Ambulatory Visit: Payer: No Typology Code available for payment source | Admitting: Family Medicine

## 2014-07-26 ENCOUNTER — Encounter: Payer: Self-pay | Admitting: Family Medicine

## 2014-07-26 ENCOUNTER — Ambulatory Visit (INDEPENDENT_AMBULATORY_CARE_PROVIDER_SITE_OTHER): Payer: No Typology Code available for payment source | Admitting: Family Medicine

## 2014-07-26 VITALS — BP 140/90 | HR 89 | Temp 98.4°F | Ht 66.5 in | Wt 277.5 lb

## 2014-07-26 DIAGNOSIS — Z85820 Personal history of malignant melanoma of skin: Secondary | ICD-10-CM

## 2014-07-26 DIAGNOSIS — R7303 Prediabetes: Secondary | ICD-10-CM

## 2014-07-26 DIAGNOSIS — I10 Essential (primary) hypertension: Secondary | ICD-10-CM

## 2014-07-26 DIAGNOSIS — E785 Hyperlipidemia, unspecified: Secondary | ICD-10-CM

## 2014-07-26 DIAGNOSIS — R7309 Other abnormal glucose: Secondary | ICD-10-CM

## 2014-07-26 MED ORDER — HYDROCHLOROTHIAZIDE 12.5 MG PO TABS: 12.5000 mg | ORAL_TABLET | Freq: Every day | ORAL | Status: DC

## 2014-07-26 NOTE — Progress Notes (Signed)
No chief complaint on file.   HPI:  Follow up:  HTN: -told to follow up in 1 month last visit -on BP med in the past -reports: under a lot of stress -denies: CP, SOB, swelling, palpitations  HLD/Prediabetes: -advised healthy diet and regular exercise -reports: has cut back from 4 to 1 can of soda per day; no CV exercise, trying to be more active  -denies: polyuria, polydipsia, vision changes  Hx melanoma: -dermatologist requires referral  ROS: See pertinent positives and negatives per HPI.  Past Medical History  Diagnosis Date  . Frequent headaches   . Skin cancer of anterior chest   . History of abnormal cervical Pap smear   . HYPERTENSION, BENIGN ESSENTIAL 08/22/2007    Qualifier: Diagnosis of  By: Radene Ou MD, [place]    . GERD 08/22/2007    Qualifier: Diagnosis of  By: Radene Ou MD, [place]    . MELANOMA, TRUNK 01/24/2008    Qualifier: Diagnosis of  By: Radene Ou MD, [place]    . PLANTAR FASCIITIS, RIGHT 07/01/2008    Qualifier: Diagnosis of  By: Radene Ou MD, [place]    . PSORIASIS 08/22/2007    Qualifier: Diagnosis of  By: Radene Ou MD, [place]    . ABFND PAP SMEAR HGSIL 08/22/2007    Qualifier: Diagnosis of  By: Radene Ou MD, [place]    . ANXIETY 08/22/2007    Qualifier: Diagnosis of  By: Radene Ou MD, [place]    . Anemia due to chronic blood loss - reports history of evaluation and told due to menorrhagia 05/30/2014    Past Surgical History  Procedure Laterality Date  . Leep    . Skin cancer excision      chest area    Family History  Problem Relation Age of Onset  . Diabetes Mother   . Hypertension Mother   . Heart disease Father   . Hypertension Father   . Diabetes Brother   . Hypertension Brother     History   Social History  . Marital Status: Single    Spouse Name: N/A    Number of Children: N/A  . Years of Education: N/A   Social History Main Topics  . Smoking status: Never Smoker   . Smokeless tobacco: None  . Alcohol Use: No  . Drug Use: No   . Sexual Activity: No   Other Topics Concern  . None   Social History Narrative   Work or School: daycare in infant room      Home Situation: lives alone      Spiritual Beliefs: none      Lifestyle: no regular exercise; diet is poor             Current outpatient prescriptions:Ferrous Sulfate 27 MG TABS, Take by mouth daily., Disp: , Rfl: ;  hydrochlorothiazide (HYDRODIURIL) 12.5 MG tablet, Take 1 tablet (12.5 mg total) by mouth daily., Disp: 90 tablet, Rfl: 3  EXAM:  Filed Vitals:   07/26/14 1337  BP: 140/90  Pulse: 89  Temp: 98.4 F (36.9 C)    Body mass index is 44.12 kg/(m^2).  GENERAL: vitals reviewed and listed above, alert, oriented, appears well hydrated and in no acute distress  HEENT: atraumatic, conjunttiva clear, no obvious abnormalities on inspection of external nose and ears  NECK: no obvious masses on inspection  LUNGS: clear to auscultation bilaterally, no wheezes, rales or rhonchi, good air movement  CV: HRRR, no peripheral edema  MS: moves all extremities without noticeable abnormality  PSYCH: pleasant and cooperative, no  obvious depression or anxiety  ASSESSMENT AND PLAN:  Discussed the following assessment and plan:  HYPERTENSION, BENIGN ESSENTIAL - Plan: hydrochlorothiazide (HYDRODIURIL) 12.5 MG tablet  Other and unspecified hyperlipidemia  Prediabetes  Hx of melanoma of skin - Plan: Ambulatory referral to Dermatology  -follow up in 3 months, fasting labs then -discussed lifestyle at length -she wants to start BP medication, on diuretic in the past and tolerated well, risks discussed -Patient advised to return or notify a doctor immediately if symptoms worsen or persist or new concerns arise.  Patient Instructions  We recommend the following healthy lifestyle measures: - eat a healthy diet consisting of lots of vegetables, fruits, beans, nuts, seeds, healthy meats such as white chicken and fish and whole grains.  - avoid fried  foods, fast food, processed foods, sodas, red meet and other fattening foods.  - get a least 150 minutes of aerobic exercise per week.   Follow up in 3 months     KIM, [facility] R.

## 2014-07-26 NOTE — Patient Instructions (Signed)
   We recommend the following healthy lifestyle measures: - eat a healthy diet consisting of lots of vegetables, fruits, beans, nuts, seeds, healthy meats such as white chicken and fish and whole grains.  - avoid fried foods, fast food, processed foods, sodas, red meet and other fattening foods.  - get a least 150 minutes of aerobic exercise per week.   Follow up in: 3 months  

## 2014-07-26 NOTE — Progress Notes (Signed)
Pre visit review using our clinic review tool, if applicable. No additional management support is needed unless otherwise documented below in the visit note. 

## 2014-07-29 ENCOUNTER — Encounter: Payer: Self-pay | Admitting: Gynecology

## 2014-07-29 ENCOUNTER — Telehealth: Payer: Self-pay | Admitting: Family Medicine

## 2014-07-29 ENCOUNTER — Ambulatory Visit (INDEPENDENT_AMBULATORY_CARE_PROVIDER_SITE_OTHER): Payer: No Typology Code available for payment source | Admitting: Gynecology

## 2014-07-29 ENCOUNTER — Ambulatory Visit: Payer: No Typology Code available for payment source | Admitting: Family Medicine

## 2014-07-29 VITALS — BP 130/76 | HR 70 | Resp 14 | Ht 67.0 in | Wt 278.0 lb

## 2014-07-29 DIAGNOSIS — Z01419 Encounter for gynecological examination (general) (routine) without abnormal findings: Secondary | ICD-10-CM

## 2014-07-29 DIAGNOSIS — Z124 Encounter for screening for malignant neoplasm of cervix: Secondary | ICD-10-CM

## 2014-07-29 DIAGNOSIS — N938 Other specified abnormal uterine and vaginal bleeding: Secondary | ICD-10-CM

## 2014-07-29 DIAGNOSIS — N949 Unspecified condition associated with female genital organs and menstrual cycle: Secondary | ICD-10-CM

## 2014-07-29 DIAGNOSIS — Z0000 Encounter for general adult medical examination without abnormal findings: Secondary | ICD-10-CM

## 2014-07-29 DIAGNOSIS — Z9889 Other specified postprocedural states: Secondary | ICD-10-CM

## 2014-07-29 LAB — POCT URINALYSIS DIPSTICK
Blood, UA: 2
UROBILINOGEN UA: NEGATIVE
pH, UA: 5

## 2014-07-29 MED ORDER — NORETHINDRONE 0.35 MG PO TABS: 1.0000 | ORAL_TABLET | Freq: Every day | ORAL | Status: DC

## 2014-07-29 NOTE — Progress Notes (Signed)
43 y.o. Single Caucasian female   G1P1001 here for annual exam. Pt is not currently sexually active.  Pt reports cycles are irregular.  LMP 7/28-8/6, rebled a few days later and now started 07/26/14.  Pt states cycle every month, usually flow 3-4d, stop then restart again.  November 2013, bled entire month with clots, usually no clots.  Was not evaluated due to lack of insurance.  Last sexual activity 2y ago.  Pt had normal cbc, tsh and hba1c.  Seen at [facility] 2010-PUS normal.  Pt reports bilateral cramping regardless of cycle.  Normal bowels, no diarrhea or constipation, no nausea or vomiting.  No vaginal discharge but wears liner daily.    Patient's last menstrual period was 07/26/2014.          Sexually active: No.  The current method of family planning is none.    Exercising: No.  The patient does not participate in regular exercise at present. Last pap: 2013/2014-[facility] Abnormal Pap: 10+ years  Alcohol:  no Tobacco: no BSE: no Mammogram: never  Urine: Blood 2 (menses), Leuks Trace, Trace Protein   Health Maintenance  Topic Date Due  . Tetanus/tdap  10/31/1991  . Pap Smear  04/11/2014  . Influenza Vaccine  07/06/2014    Family History  Problem Relation Age of Onset  . Diabetes Mother   . Hypertension Mother   . Heart disease Father   . Hypertension Father   . Diabetes Brother   . Hypertension Brother   . Diabetes Brother   . Fibroids Mother   . Heart attack Father     Patient Active Problem List   Diagnosis Date Noted  . Anemia due to chronic blood loss - reports history of evaluation and told due to menorrhagia 05/30/2014  . 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 ANXIETY 08/22/2007  . HYPERTENSION, BENIGN ESSENTIAL 08/22/2007  . GERD 08/22/2007  . MICROSCOPIC HEMATURIA 08/22/2007  . PSORIASIS 08/22/2007  . ABFND PAP SMEAR HGSIL 08/22/2007    Past Medical History  Diagnosis Date  . Frequent headaches   . Skin cancer of anterior chest   . History of abnormal cervical Pap  smear   . HYPERTENSION, BENIGN ESSENTIAL 08/22/2007    Qualifier: Diagnosis of  By: Radene Ou MD, [place]    . GERD 08/22/2007    Qualifier: Diagnosis of  By: Radene Ou MD, [place]    . MELANOMA, TRUNK 01/24/2008    Qualifier: Diagnosis of  By: Radene Ou MD, [place]    . PLANTAR FASCIITIS, RIGHT 07/01/2008    Qualifier: Diagnosis of  By: Radene Ou MD, [place]    . PSORIASIS 08/22/2007    Qualifier: Diagnosis of  By: Radene Ou MD, [place]    . ABFND PAP SMEAR HGSIL 08/22/2007    Qualifier: Diagnosis of  By: Radene Ou MD, [place]    . ANXIETY 08/22/2007    Qualifier: Diagnosis of  By: Radene Ou MD, [place]    . Anemia due to chronic blood loss - reports history of evaluation and told due to menorrhagia 05/30/2014    Past Surgical History  Procedure Laterality Date  . Leep  2009    CIN I negative margin  . Skin cancer excision  2010    chest area    Allergies: Lidocaine and Penicillins  Current Outpatient Prescriptions  Medication Sig Dispense Refill  . Ferrous Sulfate 27 MG TABS Take by mouth daily.      . hydrochlorothiazide (HYDRODIURIL) 12.5 MG tablet Take 1 tablet (12.5 mg total) by mouth daily.  90 tablet  3   No current facility-administered medications for this visit.    ROS: Pertinent items are noted in HPI.  Exam:    BP 130/76  Pulse 70  Resp 14  Ht 5\' 7"  (1.702 m)  Wt 278 lb (126.1 kg)  BMI 43.53 kg/m2  LMP 07/26/2014 Weight change: @WEIGHTCHANGE @ Last 3 height recordings:  Ht Readings from Last 3 Encounters:  07/29/14 5\' 7"  (1.702 m)  07/26/14 5' 6.5" (1.689 m)  05/30/14 5' 6.5" (1.689 m)   General appearance: alert, cooperative and appears stated age Head: Normocephalic, without obvious abnormality, atraumatic Neck: no adenopathy, no carotid bruit, no JVD, supple, symmetrical, trachea midline and thyroid not enlarged, symmetric, no tenderness/mass/nodules Lungs: clear to auscultation bilaterally Breasts: normal appearance, no masses or tenderness Heart: regular  rate and rhythm, S1, S2 normal, no murmur, click, rub or gallop Abdomen: soft, non-tender; bowel sounds normal; no masses,  no organomegaly Extremities: extremities normal, atraumatic, no cyanosis or edema Skin: Skin color, texture, turgor normal. No rashes or lesions Lymph nodes: Cervical, supraclavicular, and axillary nodes normal. no inguinal nodes palpated Neurologic: Grossly normal   Pelvic: External genitalia:  no lesions              Urethra: normal appearing urethra with no masses, tenderness or lesions              Bartholins and Skenes: Bartholin's, Urethra, Skene's normal                 Vagina: normal appearing vagina with normal color and discharge, no lesions, MENSES              Cervix: normal appearance              Pap taken: Yes.          Bimanual Exam:  Uterus:  uterus is normal size, shape, consistency and nontender                                      Adnexa:    normal adnexa in size, nontender and no masses and limited by habitus                                      Rectovaginal: Confirms                                      Anus:  normal sphincter tone, no lesions        1. Laboratory examination ordered as part of a routine general medical examination Labs from PCP - POCT Urinalysis Dipstick  2. Encounter for routine gynecological examination  counseled on breast self exam, mammography screening-overdue adequate intake of calcium and vitamin D, diet and exercise return annually or prn  3. DUB (dysfunctional uterine bleeding) Long discussion regarding her options, reviewed PUS from [facility] clinic, normal uterus no fibroids.  Offered either progestin only ocp due to her recently diagnosed HTN and risk of exacerbation or progestin IUD.  Risks and benefits of each reviewed.  Pt prefers to try ocp, she started her cycle today, she can start ocp today as well F/u 89m - US Transvaginal Non-OB; Future - norethindrone (MICRONOR,CAMILA,ERRIN) 0.35 MG tablet; Take 1 tablet  (0.35 mg total) by mouth  daily.  Dispense: 3 Package; Refill: 3  4. H/O LEEP Reviewed all PAP's from 2007-LGSIL and ASCUS noted, LEEP specimen CIN I with negative margins, reviewed guidelines with pt - Pap Test with HP (IPS)  5. Screening for cervical cancer  - Pap Test with HP (IPS)  An After Visit Summary was printed and given to the patient.

## 2014-07-29 NOTE — Telephone Encounter (Signed)
Pt said her insurance did not cover the following rx so she did not pick up the rx hydrochlorothiazide (HYDRODIURIL) 12.5 MG tablet    Wanted to know if there was something else she can try

## 2014-07-30 NOTE — Telephone Encounter (Signed)
This is a very cheap medication even without insurance and covered by most that I am aware of - make sure she has rx for generic and advise she check at several pharmacies. Likely on [facility] $4 list.

## 2014-07-30 NOTE — Telephone Encounter (Signed)
I left a message at the pts cell number to return my call. 

## 2014-07-31 ENCOUNTER — Telehealth: Payer: Self-pay | Admitting: Family Medicine

## 2014-07-31 DIAGNOSIS — I10 Essential (primary) hypertension: Secondary | ICD-10-CM

## 2014-07-31 NOTE — Telephone Encounter (Signed)
I left a message at the pts cell number stating per Dr Maudie Mercury she should be able to get the Rx from [facility] for $4 and to leave a message with the [place] she would prefer this to be sent to.

## 2014-07-31 NOTE — Telephone Encounter (Signed)
I left a message for the pt to return my call. 

## 2014-07-31 NOTE — Telephone Encounter (Signed)
Janet [name] said the pharmacy is    [facility]

## 2014-08-01 LAB — IPS PAP TEST WITH HPV

## 2014-08-01 MED ORDER — HYDROCHLOROTHIAZIDE 12.5 MG PO TABS: 12.5000 mg | ORAL_TABLET | Freq: Every day | ORAL | Status: DC

## 2014-08-01 NOTE — Telephone Encounter (Signed)
Rx sent to [facility]

## 2014-08-01 NOTE — Telephone Encounter (Signed)
Rx sent to [facility].

## 2014-08-02 NOTE — Telephone Encounter (Signed)
Done

## 2014-08-06 ENCOUNTER — Telehealth: Payer: Self-pay | Admitting: Gynecology

## 2014-08-06 NOTE — Telephone Encounter (Signed)
Pt calling sabrina back

## 2014-08-06 NOTE — Telephone Encounter (Signed)
Left message for patient to call back. Need to go over benefits and schedule PUS °

## 2014-08-07 ENCOUNTER — Telehealth: Payer: Self-pay | Admitting: Family Medicine

## 2014-08-07 NOTE — Telephone Encounter (Signed)
Spoke with patient. Advised that per benefit quote received, she will be responsible for $355.27 when she comes in for PUS. Patient states that "I will have to put that on the back burner..I don't have the money to pay for that".

## 2014-08-07 NOTE — Telephone Encounter (Signed)
Returning a call to [place]. Patient is at work and says you Youlanda Roys leave a detailed message on her voicemail.

## 2014-08-07 NOTE — Telephone Encounter (Signed)
Pt states she needs the capsule called in and not the tablet.  hydrochlorothiazide (HYDRODIURIL) 12.5 MG tablet Tablet is $74.00 and pharm states the capsule is the $4.00 med.  Can you resend this [facility] to [facility] [place]

## 2014-08-08 NOTE — Telephone Encounter (Signed)
Spoke with patient. Patient states that she started her cycle on 8/24 at which time she started taking the micronor. Patient is taking the micronor at the same time daily. Patient states that bleeding has increased and she has now passed a blood clot "through my super tampon." States she is bleeding through her super tampon and panti liner every 2-21/2 hours. Has mild cramping. Patient is unable to schedule ultrasound due to [facility] cost. "I have had these ultrasounds before and they see nothing." Advised patient that Dr.Lathrop is out of the office today so I will check with the covering provider and give her a call back with further recommendations and instructions. Patient agreeable.

## 2014-08-08 NOTE — Telephone Encounter (Signed)
Left message to call Dashayla Theissen at [phone]. 

## 2014-08-08 NOTE — Telephone Encounter (Signed)
Rx was re-called to the pharmacy for the capsule and I left a message at the pts cell regarding this and I faxed this a few days ago.

## 2014-08-08 NOTE — Telephone Encounter (Signed)
Spoke with patient. Patient states that she would like to wait and see how her bleeding does throughout the day. Advised patient needs to take micronor at the same time daily. Patient is agreeable. Patient will monitor bleeding and will call if bleeding increases to having to change pad/tampon every hour. Advised if she is continuing to bleed heavily and becomes weak, light headed, or dizzy will need to be seen. Patient is agreeable and will monitor today and call back tomorrow to schedule appointment if bleeding has not decreased. Patient will seek care if bleeding increases over night.  Routing to Dr.Silva as covering Cc: Dr.Lathrop   Routing to provider for final review. Patient agreeable to disposition. Will close encounter

## 2014-08-08 NOTE — Telephone Encounter (Signed)
Pt is calling stating that she is still on her cycle  And it started 07/29/14 it is heavy and she is passing blood clots through a super tampon. Having to change super tampon every two hours when she sits and then stands she can feel it coming out.

## 2014-08-08 NOTE — Telephone Encounter (Addendum)
Left message to call Lindsay at [phone].  Per Gay Filler patient can wait to see if bleeding decreases as her body is still adjusting to the micronor, [facility] of [facility], or she can come in today to see Dr.Silva at 2:45pm.

## 2014-08-09 NOTE — Telephone Encounter (Signed)
LM on cell asking pt to call office with status of bleeding today before Labor Day weekend

## 2014-08-13 ENCOUNTER — Encounter: Payer: Self-pay | Admitting: Gynecology

## 2014-08-13 ENCOUNTER — Ambulatory Visit (INDEPENDENT_AMBULATORY_CARE_PROVIDER_SITE_OTHER): Payer: No Typology Code available for payment source | Admitting: Gynecology

## 2014-08-13 VITALS — BP 122/82 | HR 78 | Resp 16 | Ht 67.0 in

## 2014-08-13 DIAGNOSIS — N938 Other specified abnormal uterine and vaginal bleeding: Secondary | ICD-10-CM

## 2014-08-13 DIAGNOSIS — N949 Unspecified condition associated with female genital organs and menstrual cycle: Secondary | ICD-10-CM

## 2014-08-13 NOTE — Progress Notes (Signed)
Subjective:     Patient ID: Janet [name], female   DOB: 09-Oct-1971, 43 y.o.   MRN: [id number]  HPI Comments: Pt is day 12 of first pack of micronor for menorrhagia with irregular cycle.  Pt called shortly after starting for heavy bleeding but declined office visit.  Pt states that she is still bleeding but that it is getting lighter.  Pt states that she passed a clot through her tampon this am.  She has been passing clots on this ocp.  Pt had a cycle for the entire month of November 2014, and bled erratically July and august.    Review of Systems  Constitutional: Positive for fatigue.  Respiratory: Negative for shortness of breath.   Genitourinary: Positive for vaginal bleeding. Negative for vaginal pain and pelvic pain.  Neurological: Negative for dizziness, weakness and light-headedness.       Objective:   Physical Exam  Nursing note and vitals reviewed. Constitutional: She is oriented to person, place, and time. She appears well-developed and well-nourished.  Genitourinary:   Pelvic: External genitalia:  no lesions              Urethra:  normal appearing urethra with no masses, tenderness or lesions              Bartholins and Skenes: normal                 Vagina: normal appearing vagina with normal color and discharge, no lesions              Cervix: normal appearance, small clot at os, no active bleeding, no change with valsalva                      Bimanual Exam:  Uterus:  uterus is normal size, shape, consistency and nontender                                       Adnexa: normal adnexa in size, nontender and no masses                                        Neurological: She is alert and oriented to person, place, and time.  Skin: Skin is warm and dry.       Assessment:     DUB     Plan:     Per pt, bleeding is getting better, explained build-up of lining with anovulatory bleeding and need to shed.  Seems to be doing better today, BTB after full night of sleep can  occur.  Asked to watch, contact office if bleeding gets heavier again on current pack of pills, will treat with higher dose progestins if needed. Pt agreeable Questions addressed

## 2014-08-13 NOTE — Telephone Encounter (Signed)
Spoke with patient. Patient states "I am in the same shape that I was in before. I was trying to wait it out but it hasn't changed." Advised patient will need to be seen with Dr.Lathrop for evaluation. Patient agreeable. Appointment scheduled for today at 2:30pm. Agreeable to date and time.  Routing to provider for final review. Patient agreeable to disposition. Will close encounter

## 2014-08-20 ENCOUNTER — Telehealth: Payer: Self-pay | Admitting: Gynecology

## 2014-08-20 NOTE — Telephone Encounter (Signed)
Pt said that she wants to get the higher dosage of her medication. doesn't remember the name.

## 2014-08-20 NOTE — Telephone Encounter (Signed)
Message left to return call to [place] at [phone].   Triage bleeding/DUB.  Spoke with Dr. Charlies Constable, if bleeding  Has slowed decreased, needs reassurance.  If bleeding has increased, advise to dc micronor, then start Aygestin 5 mg bid until bleeding stops, then 5 mg daily for ten days total, then return to taking Micronor daily.

## 2014-08-21 MED ORDER — NORETHINDRONE ACETATE 5 MG PO TABS: ORAL_TABLET | ORAL | Status: DC

## 2014-08-21 NOTE — Telephone Encounter (Addendum)
Called patient. She states that bleeding decreased for two days, then bleeding restarted as it has been.  She has not missed any pills. Takes Micronor at night.  Patient changing super tampon q 2.5 hours.  Denies symptoms, no dizziness, sob, weakness, or chest pain. Patient is at work right now so unable to discuss in great detail. She does not request to call me back. She would like me to return call with instructions regarding Aygestin and leave detailed message. Aygestin sent to [facility]. Aygestin 5 mg po bid until bleeding stops, then 5 mg daily for total of ten days. #30 with 0 refills.    Called patient on mobile phone and left detailed message with instructions.  DC Micronor, start taking Aygestin. Can start tonight. Take one tablet two times per day until bleeding stops, then 1 tablet per day for ten total days. Gave example, if takes 3 days for bleeding to stop, then take 1 tablet per day for 7 days, then restart micronor. To return call with any continued bleeding. Advised patient to call back or seek immediate medical care if bleeding worsens or soaking through 1 pad/tampon per hour for two hours or if becomes symptomatic with sob, chest pain, fatigued, lightheaded, or weakness.  Advised patient to call back with any questions or concerns.  Dr. Charlies Constable can you review  And advise if you agree with instructions.

## 2014-08-26 ENCOUNTER — Telehealth: Payer: Self-pay | Admitting: Gynecology

## 2014-08-26 DIAGNOSIS — N938 Other specified abnormal uterine and vaginal bleeding: Secondary | ICD-10-CM

## 2014-08-26 NOTE — Telephone Encounter (Signed)
Pt calling sabrina back said that she would be available at this number until 1

## 2014-08-26 NOTE — Telephone Encounter (Signed)
Spoke with patient. Patient cannot afford out of pocket cost for PUS $355.27. Would like to be scheduled at [facility]. Please call and advise. Thanks.

## 2014-08-26 NOTE — Telephone Encounter (Signed)
Left message for patient to call back. Need to go over benefits and schedule PUS °

## 2014-08-26 NOTE — Telephone Encounter (Signed)
Dr. Charlies Constable,  Okay to schedule for patient at [facility] Radiology?  Order will need to be changed to ultrasound pelvis complete and ultrasound pelvis transvaginal non ob? Okay to change orders and have patient have ultrasound done at [facility] radiology?

## 2014-08-27 NOTE — Telephone Encounter (Signed)
Message left to return call to [place] at [phone].    [facility] Appointment scheduled for 09/02/14 at 1:45 for appointment at 2:00. Can call and rescheduled at [phone].

## 2014-08-29 NOTE — Telephone Encounter (Signed)
Called patient again. Left detailed message with information.  Advised of appointment and need to follow up. Please call back to update if will keep this appointment for Pelvic ultrasound. Detailed message left okay per designated party release form.

## 2014-09-02 ENCOUNTER — Ambulatory Visit (HOSPITAL_COMMUNITY): Payer: No Typology Code available for payment source

## 2014-09-02 NOTE — Telephone Encounter (Signed)
Dr. Charlies Constable,  Patient r/s her ultrasound at [facility] hospital to 09/26/14 at 1245.  This was for evaluation of DUB.   Okay to close encounter?

## 2014-09-02 NOTE — Telephone Encounter (Signed)
ok 

## 2014-09-06 ENCOUNTER — Telehealth: Payer: Self-pay | Admitting: Gynecology

## 2014-09-06 NOTE — Telephone Encounter (Signed)
Spoke with patient at time of incoming call. Patient reports that she took Aygestin 5 mg bid x 1 week and bleeding decreased significantly, was only having brown spotting. Then started on Aygestin 5 mg daily. Patient reports when she started on Aygestin 5 mg daily, that she started bleeding again and now reports that her bleeding is back on "full force".  Reports changing regular tampon 5 times since 0700 this morning. Reports small clots as well with menses.   Patient has scheduled her ultrasound at [facility] hospital for 09/26/14. She states that she has it scheduled for then because she is going on vacation from 10/7-10/12/15.   Discussed with Dr. Charlies Constable at time of call. Patient to take Aygestin 5 mg bid and be seen for office visit Monday for possible endometrial biopsy. Patient declines office visit for Monday 09/09/14. Requests afternoon 09/10/14 Tuesday, due to work schedule. Scheduled patient for 09/10/14 but advised would need to ensure okay with Dr. Charlies Constable.   Patient states she has ten tablets of Aygestin 5 mg, enough to last until office visit for evaluation with Dr. Charlies Constable on Tuesday.  Advised patient to call back or seek immediate medical care if bleeding worsens or soaking through 1 pad/tampon per hour for two hours or if becomes symptomatic with sob, chest pain, fatigued, lightheaded, or weakness.  Patient verbalized understanding.   Dr. Charlies Constable, okay for patient 09/10/14 at 1600?

## 2014-09-06 NOTE — Telephone Encounter (Signed)
Patient calling to report she has not stopped bleeding while taking: norethindrone (AYGESTIN) 5 MG tablet  Take 1 tablet po bid until bleeding stops, then 1 tablet po daily for ten days., Normal, Last Dose: Not Recorded   Please advise.

## 2014-09-10 ENCOUNTER — Ambulatory Visit (INDEPENDENT_AMBULATORY_CARE_PROVIDER_SITE_OTHER): Payer: No Typology Code available for payment source | Admitting: Gynecology

## 2014-09-10 ENCOUNTER — Encounter: Payer: Self-pay | Admitting: Gynecology

## 2014-09-10 VITALS — BP 140/90 | HR 72 | Resp 12 | Ht 67.0 in | Wt 276.0 lb

## 2014-09-10 DIAGNOSIS — N938 Other specified abnormal uterine and vaginal bleeding: Secondary | ICD-10-CM

## 2014-09-10 MED ORDER — MEGESTROL ACETATE 40 MG PO TABS: 40.0000 mg | ORAL_TABLET | Freq: Two times a day (BID) | ORAL | Status: DC

## 2014-09-10 NOTE — Progress Notes (Signed)
Pt here for EMB due to DUB.  Pt was started on micronor in 07/2014 and continued to bleed, changed to aygestin 10mg /d but could not tolerate taper to 5mg  and would rebleed.  Pt has a PUS at [facility] 10/22 due to insurance.   Pt is not sexually active. Pt had a normal PUS at [facility] in 2010. Consent obtained  Speculum placed, cervix cleaned with betadine and xylocaine jelly 2% placed, stabilized with single tooth tenaculum.  pipelle advanced with ease to 8cm, single pass for sufficient tissue was obtained Pt tolerated well Tissue to pathology Change progestin to megace 40mg  BID Will contact with results

## 2014-09-12 NOTE — Telephone Encounter (Signed)
Patient was seen for office visit on 09/10/14. Will close encounter.  Routing to provider for final review. Patient agreeable to disposition. Will close encounter

## 2014-09-13 LAB — IPS OTHER TISSUE BIOPSY

## 2014-09-13 NOTE — Telephone Encounter (Signed)
Message copied by Jasmine Awe on Fri Sep 13, 2014  5:08 PM ------      Message from: Elveria Rising      Created: Fri Sep 13, 2014  3:35 PM       INFORM BX IS BENIGN, C/W PROGESTIN GIVEN, ASK IF DOING BETTER ON MEGACE ------

## 2014-09-13 NOTE — Telephone Encounter (Signed)
Spoke with patient. Advised of results as seen below from Dr.Lathrop. Patient is agreeable and verbalizes understanding. Patient states that she started progestin on Wednesday and states that her bleeding is around the same. Would like to keep taking over the weekend and call on Monday if things have not changed. "I just started taking it so it may need some time." Advised patient to return call if bleeding has not changed. Patient is agreeable.   Dr.Lathrop, anything further for patient?

## 2014-09-14 NOTE — Telephone Encounter (Signed)
Will reach out to pt after the weekend, she does need that u/s as we suggested

## 2014-09-17 NOTE — Telephone Encounter (Signed)
Spoke with patient. Patient states that her bleeding has decreased to "light and brown in color. I am wearing a pad now. I have to change it every 3-4 hours." Patient would like to know if she needs to refill megace and continue to take before starting back on OCP. Placed patient on hold to speak with Dr.Lathrop. Dr.Lathrop picked up call and advised patient to increase megace to three times a day, get rx refilled and continue to take three times a day, and have ultrasound on 10/22. Patient is agreeable.  Routing to provider for final review. Patient agreeable to disposition. Will close encounter

## 2014-09-17 NOTE — Telephone Encounter (Signed)
Left message to call Kaitlyn at [phone]. 

## 2014-09-20 ENCOUNTER — Other Ambulatory Visit: Payer: Self-pay

## 2014-09-26 ENCOUNTER — Ambulatory Visit (HOSPITAL_COMMUNITY): Payer: No Typology Code available for payment source

## 2014-09-26 ENCOUNTER — Ambulatory Visit (HOSPITAL_COMMUNITY)
Admission: RE | Admit: 2014-09-26 | Discharge: 2014-09-26 | Disposition: A | Discharge status: Home / Self Care | Payer: No Typology Code available for payment source | Source: Ambulatory Visit | Attending: Gynecology | Admitting: Gynecology

## 2014-09-26 DIAGNOSIS — N938 Other specified abnormal uterine and vaginal bleeding: Secondary | ICD-10-CM | POA: Insufficient documentation

## 2014-09-26 DIAGNOSIS — D252 Subserosal leiomyoma of uterus: Secondary | ICD-10-CM | POA: Insufficient documentation

## 2014-09-26 DIAGNOSIS — N949 Unspecified condition associated with female genital organs and menstrual cycle: Secondary | ICD-10-CM | POA: Insufficient documentation

## 2014-09-30 ENCOUNTER — Telehealth: Payer: Self-pay | Admitting: *Deleted

## 2014-09-30 NOTE — Telephone Encounter (Signed)
Left Message To Call Back  

## 2014-09-30 NOTE — Telephone Encounter (Signed)
Message copied by Alfonzo Feller on Mon Sep 30, 2014 10:32 AM ------      Message from: Elveria Rising      Created: Fri Sep 27, 2014  3:45 PM       U/s is normal, she should f/u sooner that 11/13 appt if she is still bleeding ------

## 2014-09-30 NOTE — Telephone Encounter (Signed)
Patient notified see result note 

## 2014-10-03 ENCOUNTER — Telehealth: Payer: Self-pay | Admitting: Gynecology

## 2014-10-03 NOTE — Telephone Encounter (Signed)
Call to pt to let her know that dr lathrop is no longer with the practice and we need to rs her appt. lmtcb  Pt was scheduled for a 3 month reck.

## 2014-10-07 ENCOUNTER — Encounter: Payer: Self-pay | Admitting: Gynecology

## 2014-10-11 ENCOUNTER — Telehealth: Payer: Self-pay | Admitting: Nurse Practitioner

## 2014-10-11 NOTE — Telephone Encounter (Signed)
Patient wants to know if she should restart her birth control this Sunday. She had a "full period" this week and just wants to be sure when to restart birth control pills.

## 2014-10-11 NOTE — Telephone Encounter (Signed)
Spoke with patient. Patient states that she is currently on cycle and would like to know when to start OCP. Advised patient can start new pack of OCP on Sunday. Will need to use BUM through this whole pack of pills, have cycle and restart new pack. Advised when she starts second pack of OCP she will be covered for contraception as long as pills are not missed and taken at the same time daily.  Routing to provider for final review. Patient agreeable to disposition. Will close encounter

## 2014-10-17 ENCOUNTER — Telehealth: Payer: Self-pay | Admitting: Nurse Practitioner

## 2014-10-17 NOTE — Telephone Encounter (Signed)
Patient cancelled 3 month recheck for 10/21/14 with P. Raquel Sarna as she will be out of town. She will call back to reschedule.

## 2014-10-18 ENCOUNTER — Ambulatory Visit: Payer: No Typology Code available for payment source | Admitting: Gynecology

## 2014-10-21 ENCOUNTER — Ambulatory Visit: Payer: No Typology Code available for payment source | Admitting: Nurse Practitioner

## 2014-10-22 NOTE — Telephone Encounter (Signed)
Routing to [employer], FNP as Janet [name]. This is a follow up for DUB. Prior patient of Dr. Charlies Constable. As per note below, patient will call back to reschedule.  Okay to close?

## 2014-10-22 NOTE — Telephone Encounter (Signed)
Yes OK to close 

## 2014-10-23 ENCOUNTER — Encounter: Payer: Self-pay | Admitting: Family Medicine

## 2014-10-23 ENCOUNTER — Ambulatory Visit (INDEPENDENT_AMBULATORY_CARE_PROVIDER_SITE_OTHER): Payer: No Typology Code available for payment source | Admitting: Family Medicine

## 2014-10-23 VITALS — BP 118/86 | HR 77 | Temp 97.9°F | Ht 67.0 in | Wt 274.6 lb

## 2014-10-23 DIAGNOSIS — I10 Essential (primary) hypertension: Secondary | ICD-10-CM

## 2014-10-23 DIAGNOSIS — F32A Depression, unspecified: Secondary | ICD-10-CM

## 2014-10-23 DIAGNOSIS — R7309 Other abnormal glucose: Secondary | ICD-10-CM

## 2014-10-23 DIAGNOSIS — E785 Hyperlipidemia, unspecified: Secondary | ICD-10-CM

## 2014-10-23 DIAGNOSIS — R7303 Prediabetes: Secondary | ICD-10-CM

## 2014-10-23 DIAGNOSIS — F329 Major depressive disorder, single episode, unspecified: Secondary | ICD-10-CM

## 2014-10-23 DIAGNOSIS — R300 Dysuria: Secondary | ICD-10-CM

## 2014-10-23 LAB — POCT URINALYSIS DIPSTICK
Bilirubin, UA: NEGATIVE
Glucose, UA: NEGATIVE
Ketones, UA: NEGATIVE
Nitrite, UA: NEGATIVE
Spec Grav, UA: 1.02
Urobilinogen, UA: 0.2
pH, UA: 6

## 2014-10-23 LAB — BASIC METABOLIC PANEL WITH GFR
BUN: 8 mg/dL (ref 6–23)
CO2: 24 meq/L (ref 19–32)
Calcium: 9.4 mg/dL (ref 8.4–10.5)
Chloride: 107 meq/L (ref 96–112)
Creatinine, Ser: 0.7 mg/dL (ref 0.4–1.2)
GFR: 95.96 mL/min
Glucose, Bld: 84 mg/dL (ref 70–99)
Potassium: 3.9 meq/L (ref 3.5–5.1)
Sodium: 143 meq/L (ref 135–145)

## 2014-10-23 LAB — HEMOGLOBIN A1C: Hgb A1c MFr Bld: 6.3 % (ref 4.6–6.5)

## 2014-10-23 MED ORDER — CIPROFLOXACIN HCL 250 MG PO TABS
250.0000 mg | ORAL_TABLET | Freq: Two times a day (BID) | ORAL | Status: DC
Start: 2014-10-23 — End: 2015-02-03

## 2014-10-23 MED ORDER — SERTRALINE HCL 50 MG PO TABS
50.0000 mg | ORAL_TABLET | Freq: Every day | ORAL | Status: DC
Start: 2014-10-23 — End: 2015-02-03
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
## 2014-10-23 NOTE — Addendum Note (Signed)
Addended by: Agnes Lawrence on: 10/23/2014 09:14 AM   Modules accepted: Orders

## 2014-10-23 NOTE — Patient Instructions (Addendum)
BEFORE YOU LEAVE: -schedule follow up in 1 month -labs  Take the cipro for the urinary symptoms  Start the zoloft - take every day and do not stop this medication suddenly  Call for counseling  We recommend the following healthy lifestyle measures: - eat a healthy diet consisting of lots of vegetables, fruits, beans, nuts, seeds, healthy meats such as white chicken and fish and whole grains.  - avoid fried foods, fast food, processed foods, sodas, red meet and other fattening foods.  - get a least 150 minutes of aerobic exercise per week.

## 2014-10-23 NOTE — Progress Notes (Signed)
Pre visit review using our clinic review tool, if applicable. No additional management support is needed unless otherwise documented below in the visit note. 

## 2014-10-24 LAB — URINE CULTURE: Colony Count: 50000

## 2014-10-25 ENCOUNTER — Ambulatory Visit: Payer: No Typology Code available for payment source | Admitting: Family Medicine

## 2014-10-28 ENCOUNTER — Other Ambulatory Visit: Payer: Self-pay | Admitting: *Deleted

## 2014-10-28 ENCOUNTER — Telehealth: Payer: Self-pay | Admitting: *Deleted

## 2014-10-28 DIAGNOSIS — R350 Frequency of micturition: Secondary | ICD-10-CM

## 2014-10-28 DIAGNOSIS — R300 Dysuria: Secondary | ICD-10-CM

## 2014-10-28 NOTE — Telephone Encounter (Signed)
Patient called stating she still has pain with urination, frequency and she is not better since she finished Cipro and questioned what to do.  Per Dr Sherren Mocha the pt was informed the urine culture was negative and she should be better after taking the Cipro and since she is not, there could be some other cause for her symptoms and she should see the urologist.  The order was placed for this referral and the pt is aware to expect a call with an appt.

## 2014-11-13 ENCOUNTER — Telehealth: Payer: Self-pay | Admitting: Emergency Medicine

## 2014-11-13 NOTE — Telephone Encounter (Signed)
-----   Message from Lyman Speller, MD sent at 11/12/2014 10:08 AM EST ----- Regarding: RE: Imaging hold  Yes.  Pt has also had a negative endometrial biopsy.  THanks.  MSM ----- Message -----    From: Karen Chafe, RN    Sent: 11/12/2014   9:46 AM      To: Lyman Speller, MD Subject: Imaging hold                                   Dr. Sabra Heck,  Can this patient be removed from imaging hold? She was under the care of Dr. Charlies Constable for DUB [facility] then scheduled her for follow up with Patty and patient cancelled follow up.

## 2014-11-25 ENCOUNTER — Ambulatory Visit: Payer: No Typology Code available for payment source | Admitting: Family Medicine

## 2014-12-11 ENCOUNTER — Encounter: Payer: Self-pay | Admitting: Gynecology

## 2014-12-16 ENCOUNTER — Ambulatory Visit: Payer: No Typology Code available for payment source | Admitting: Family Medicine

## 2015-01-30 ENCOUNTER — Encounter: Payer: Self-pay | Admitting: Obstetrics & Gynecology

## 2015-02-03 ENCOUNTER — Encounter: Payer: Self-pay | Admitting: Family Medicine

## 2015-02-03 ENCOUNTER — Ambulatory Visit (INDEPENDENT_AMBULATORY_CARE_PROVIDER_SITE_OTHER): Payer: No Typology Code available for payment source | Admitting: Family Medicine

## 2015-02-03 VITALS — BP 122/84 | HR 88 | Temp 98.3°F | Ht 67.0 in | Wt 277.9 lb

## 2015-02-03 DIAGNOSIS — R739 Hyperglycemia, unspecified: Secondary | ICD-10-CM

## 2015-02-03 DIAGNOSIS — F418 Other specified anxiety disorders: Secondary | ICD-10-CM

## 2015-02-03 DIAGNOSIS — E669 Obesity, unspecified: Secondary | ICD-10-CM

## 2015-02-03 DIAGNOSIS — F32A Depression, unspecified: Secondary | ICD-10-CM

## 2015-02-03 DIAGNOSIS — E785 Hyperlipidemia, unspecified: Secondary | ICD-10-CM

## 2015-02-03 DIAGNOSIS — F329 Major depressive disorder, single episode, unspecified: Secondary | ICD-10-CM | POA: Insufficient documentation

## 2015-02-03 DIAGNOSIS — F419 Anxiety disorder, unspecified: Principal | ICD-10-CM

## 2015-02-03 HISTORY — DX: Obesity, unspecified: E66.9

## 2015-02-03 MED ORDER — PAROXETINE HCL 20 MG PO TABS: 20.0000 mg | ORAL_TABLET | Freq: Every day | ORAL | Status: DC

## 2015-02-03 NOTE — Patient Instructions (Addendum)
BEFORE YOU LEAVE: -follow up in 1 month  Start the paroxetine once daily   Please consider counseling  We recommend the following healthy lifestyle measures: - eat a healthy diet consisting of lots of vegetables, fruits, beans, nuts, seeds, healthy meats such as white chicken and fish and whole grains.  - avoid fried foods, fast food, processed foods, sodas, red meet and other fattening foods.  - get a least 150 minutes of aerobic exercise per week.

## 2015-02-03 NOTE — Progress Notes (Signed)
Pre visit review using our clinic review tool, if applicable. No additional management support is needed unless otherwise documented below in the visit note. 
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
## 2015-03-10 ENCOUNTER — Ambulatory Visit: Payer: No Typology Code available for payment source | Admitting: Family Medicine

## 2015-04-18 ENCOUNTER — Ambulatory Visit: Payer: No Typology Code available for payment source | Admitting: Family Medicine

## 2016-01-01 ENCOUNTER — Encounter (HOSPITAL_COMMUNITY): Payer: Self-pay | Admitting: *Deleted

## 2016-01-01 ENCOUNTER — Emergency Department (HOSPITAL_COMMUNITY)
Admission: EM | Admit: 2016-01-01 | Discharge: 2016-01-01 | Disposition: A | Discharge status: Home / Self Care | Payer: No Typology Code available for payment source | Attending: Emergency Medicine | Admitting: Emergency Medicine

## 2016-01-01 DIAGNOSIS — Z8739 Personal history of other diseases of the musculoskeletal system and connective tissue: Secondary | ICD-10-CM | POA: Insufficient documentation

## 2016-01-01 DIAGNOSIS — I10 Essential (primary) hypertension: Secondary | ICD-10-CM | POA: Insufficient documentation

## 2016-01-01 DIAGNOSIS — Z85820 Personal history of malignant melanoma of skin: Secondary | ICD-10-CM | POA: Insufficient documentation

## 2016-01-01 DIAGNOSIS — E669 Obesity, unspecified: Secondary | ICD-10-CM | POA: Insufficient documentation

## 2016-01-01 DIAGNOSIS — R110 Nausea: Secondary | ICD-10-CM | POA: Insufficient documentation

## 2016-01-01 DIAGNOSIS — Z872 Personal history of diseases of the skin and subcutaneous tissue: Secondary | ICD-10-CM | POA: Insufficient documentation

## 2016-01-01 DIAGNOSIS — Z862 Personal history of diseases of the blood and blood-forming organs and certain disorders involving the immune mechanism: Secondary | ICD-10-CM | POA: Insufficient documentation

## 2016-01-01 DIAGNOSIS — Z8659 Personal history of other mental and behavioral disorders: Secondary | ICD-10-CM | POA: Insufficient documentation

## 2016-01-01 DIAGNOSIS — Z7982 Long term (current) use of aspirin: Secondary | ICD-10-CM | POA: Insufficient documentation

## 2016-01-01 DIAGNOSIS — Z8719 Personal history of other diseases of the digestive system: Secondary | ICD-10-CM | POA: Insufficient documentation

## 2016-01-01 DIAGNOSIS — Z3202 Encounter for pregnancy test, result negative: Secondary | ICD-10-CM | POA: Insufficient documentation

## 2016-01-01 DIAGNOSIS — R61 Generalized hyperhidrosis: Secondary | ICD-10-CM | POA: Insufficient documentation

## 2016-01-01 DIAGNOSIS — Z880 Allergy status to penicillin: Secondary | ICD-10-CM | POA: Insufficient documentation

## 2016-01-01 DIAGNOSIS — N390 Urinary tract infection, site not specified: Secondary | ICD-10-CM | POA: Insufficient documentation

## 2016-01-01 LAB — BASIC METABOLIC PANEL
ANION GAP: 11 (ref 5–15)
BUN: 10 mg/dL (ref 6–20)
CHLORIDE: 103 mmol/L (ref 101–111)
CO2: 24 mmol/L (ref 22–32)
Calcium: 9.4 mg/dL (ref 8.9–10.3)
Creatinine, Ser: 0.66 mg/dL (ref 0.44–1.00)
GFR calc Af Amer: >60 mL/min (ref >60)
Glucose, Bld: 107 mg/dL — ABNORMAL HIGH (ref 65–99)
POTASSIUM: 4.1 mmol/L (ref 3.5–5.1)
SODIUM: 138 mmol/L (ref 135–145)

## 2016-01-01 LAB — URINALYSIS, ROUTINE W REFLEX MICROSCOPIC
BILIRUBIN URINE: NEGATIVE
GLUCOSE, UA: NEGATIVE mg/dL
KETONES UR: NEGATIVE mg/dL
Nitrite: NEGATIVE
PROTEIN: 100 mg/dL — AB
Specific Gravity, Urine: 1.021 (ref 1.005–1.030)
pH: 6.5 (ref 5.0–8.0)

## 2016-01-01 LAB — CBC WITH DIFFERENTIAL/PLATELET
Basophils Absolute: 0 10*3/uL (ref 0.0–0.1)
Basophils Relative: 0 %
EOS ABS: 0.2 10*3/uL (ref 0.0–0.7)
Eosinophils Relative: 2 %
HCT: 43.8 % (ref 36.0–46.0)
HEMOGLOBIN: 14.9 g/dL (ref 12.0–15.0)
LYMPHS ABS: 1.2 10*3/uL (ref 0.7–4.0)
LYMPHS PCT: 13 %
MCH: 31.6 pg (ref 26.0–34.0)
MCHC: 34 g/dL (ref 30.0–36.0)
MCV: 93 fL (ref 78.0–100.0)
Monocytes Absolute: 0.7 10*3/uL (ref 0.1–1.0)
Monocytes Relative: 7 %
NEUTROS PCT: 78 %
Neutro Abs: 7.8 10*3/uL — ABNORMAL HIGH (ref 1.7–7.7)
PLATELETS: 277 10*3/uL (ref 150–400)
RBC: 4.71 MIL/uL (ref 3.87–5.11)
RDW: 12.2 % (ref 11.5–15.5)
WBC: 10 10*3/uL (ref 4.0–10.5)

## 2016-01-01 LAB — URINE MICROSCOPIC-ADD ON

## 2016-01-01 LAB — TROPONIN I: Troponin I: <0.03 ng/mL (ref <0.031)

## 2016-01-01 LAB — POC URINE PREG, ED: Preg Test, Ur: NEGATIVE

## 2016-01-01 LAB — TSH: TSH: 1.867 u[IU]/mL (ref 0.350–4.500)

## 2016-01-01 LAB — T4, FREE: Free T4: 0.78 ng/dL (ref 0.61–1.12)

## 2016-01-01 MED ORDER — NITROFURANTOIN MONOHYD MACRO 100 MG PO CAPS: 100.0000 mg | ORAL_CAPSULE | Freq: Two times a day (BID) | ORAL | Status: DC

## 2016-01-01 MED ORDER — ONDANSETRON 4 MG PO TBDP
4.0000 mg | ORAL_TABLET | Freq: Once | ORAL | Status: AC | PRN
  Administered 2016-01-01: 4 mg via ORAL
  Filled 2016-01-01: qty 1

## 2016-01-01 MED ORDER — ONDANSETRON 4 MG PO TBDP: 4.0000 mg | ORAL_TABLET | Freq: Three times a day (TID) | ORAL | Status: DC | PRN

## 2016-01-01 NOTE — ED Notes (Signed)
The pt reports that he has been feeling weak for over a month  Worse today   lmp now  Alert oriented skin warm and dry

## 2016-01-01 NOTE — Discharge Instructions (Signed)
Take your medications as prescribed. Continue drinking fluids at home to remain hydrated.  Follow up with your primary care provider in 3 days for follow up.  Please return to the Emergency Department if symptoms worsen or new onset of fever, shortness of breath, chest pain, abdominal pain, vomiting, blood in urine or stool, numbness, tingling, weakness.

## 2016-01-01 NOTE — ED Provider Notes (Signed)
CSN: [id number]     Arrival date & time 01/01/16  [id number] History   First MD Initiated Contact with Patient 01/01/16 0815     Chief Complaint  Patient presents with  . Weakness     (Consider location/radiation/quality/duration/timing/severity/associated sxs/prior Treatment) HPI   Shows a 45 year old female past medical history of anxiety, depression, hypertension and GERD who presents to the ED with complaint of generalized fatigue. Patient reports she has been feeling fatigued for the past few months. She notes it worsened today and reports when she woke up this morning she was sweating and felt nauseous. She notes he nausea has improved since arrival to the ED. Denies fever, chills, HA, visual changes, lightheadedness, dizziness, congestion, sore throat, cough, SOB, wheezing, CP, palpitations, abdominal pain, vomiting, diarrhea, urinary sxs, vaginal d/c, numbness, tingling, weakness. She notes she is currently on her menstrual cycle which started on 12/29/15. Pt denies taking any medications at home for any medical problems.   Past Medical History  Diagnosis Date  . Frequent headaches   . Anxiety and depression   . HYPERTENSION, BENIGN ESSENTIAL 08/22/2007  . GERD 08/22/2007  . 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 PLANTAR FASCIITIS, RIGHT 07/01/2008  . PSORIASIS 08/22/2007  . ABFND PAP SMEAR HGSIL 08/22/2007  . ANXIETY 08/22/2007  . Anemia due to chronic blood loss - reports history of evaluation and told due to menorrhagia 05/30/2014  . Hyperlipemia   . Hyperglycemia   . Obesity 02/03/2015   Past Surgical History  Procedure Laterality Date  . Leep  2009    CIN I negative margin  . Skin cancer excision  2010    chest area   Family History  Problem Relation Age of Onset  . Diabetes Mother   . Hypertension Mother   . Heart disease Father   . Hypertension Father   . Diabetes Brother   . Hypertension Brother   . Diabetes Brother   . Fibroids Mother   . Heart attack Father    Social History   Substance Use Topics  . Smoking status: Never Smoker   . Smokeless tobacco: None  . Alcohol Use: No   OB History    Gravida Para Term Preterm AB TAB SAB Ectopic Multiple Living   1 1 1       1      Review of Systems  Constitutional: Positive for diaphoresis and fatigue.  Gastrointestinal: Positive for nausea.  All other systems reviewed and are negative.     Allergies  Lidocaine and Penicillins  Home Medications   Prior to Admission medications   Medication Sig Start Date End Date Taking? Authorizing Provider  aspirin 325 MG tablet Take 325 mg by mouth every 6 (six) hours as needed for mild pain.   Yes Historical Provider, MD  ibuprofen (ADVIL,MOTRIN) 200 MG tablet Take 200 mg by mouth every 6 (six) hours as needed for moderate pain.   Yes Historical Provider, MD  hydrochlorothiazide (HYDRODIURIL) 12.5 MG tablet Take 1 tablet (12.5 mg total) by mouth daily. Patient not taking: Reported on 01/01/2016 08/01/14   Lucretia Kern, DO  nitrofurantoin, macrocrystal-monohydrate, (MACROBID) 100 MG capsule Take 1 capsule (100 mg total) by mouth 2 (two) times daily. 01/01/16   Nona Dell, PA-C  norethindrone (MICRONOR,CAMILA,ERRIN) 0.35 MG tablet Take 1 tablet (0.35 mg total) by mouth daily. Patient not taking: Reported on 01/01/2016 07/29/14   Elveria Rising, MD  ondansetron (ZOFRAN ODT) 4 MG disintegrating tablet Take 1 tablet (4 mg total) by mouth every  8 (eight) hours as needed for nausea or vomiting. 01/01/16   Nona Dell, PA-C  PARoxetine (PAXIL) 20 MG tablet Take 1 tablet (20 mg total) by mouth daily. Patient not taking: Reported on 01/01/2016 02/03/15   Lucretia Kern, DO   BP 117/77 mmHg  Pulse 82  Temp(Src) 98.2 F (36.8 C) (Oral)  Resp 17  Ht 5\' 6"  (1.676 m)  Wt 128.028 kg  BMI 45.58 kg/m2  SpO2 93%  LMP 01/01/2016 Physical Exam  Constitutional: She is oriented to person, place, and time. She appears well-developed and well-nourished. No distress.  HENT:   Head: Normocephalic and atraumatic.  Mouth/Throat: Oropharynx is clear and moist. No oropharyngeal exudate.  Eyes: Conjunctivae and EOM are normal. Pupils are equal, round, and reactive to light. Right eye exhibits no discharge. Left eye exhibits no discharge. No scleral icterus.  Neck: Normal range of motion. Neck supple.  Cardiovascular: Normal rate, regular rhythm, normal heart sounds and intact distal pulses.   Pulmonary/Chest: Effort normal and breath sounds normal. No respiratory distress. She has no wheezes. She has no rales. She exhibits no tenderness.  Abdominal: Soft. Bowel sounds are normal. She exhibits no distension and no mass. There is no tenderness. There is no rebound and no guarding.  Musculoskeletal: Normal range of motion. She exhibits no edema.  Lymphadenopathy:    She has no cervical adenopathy.  Neurological: She is alert and oriented to person, place, and time. She has normal strength. No sensory deficit.  Skin: Skin is warm and dry. She is not diaphoretic.  Nursing note and vitals reviewed.   ED Course  Procedures (including critical care time) Labs Review Labs Reviewed  CBC WITH DIFFERENTIAL/PLATELET - Abnormal; Notable for the following:    Neutro Abs 7.8 (*)    All other components within normal limits  BASIC METABOLIC PANEL - Abnormal; Notable for the following:    Glucose, Bld 107 (*)    All other components within normal limits  URINALYSIS, ROUTINE W REFLEX MICROSCOPIC (NOT AT [facility]) - Abnormal; Notable for the following:    Color, Urine RED (*)    APPearance CLOUDY (*)    Hgb urine dipstick LARGE (*)    Protein, ur 100 (*)    Leukocytes, UA SMALL (*)    All other components within normal limits  URINE MICROSCOPIC-ADD ON - Abnormal; Notable for the following:    Squamous Epithelial / LPF 6-30 (*)    Bacteria, UA MANY (*)    All other components within normal limits  URINE CULTURE  TSH  T4, FREE  TROPONIN I  POC URINE PREG, ED    Imaging  Review No results found. I have personally reviewed and evaluated these images and lab results as part of my medical decision-making.   EKG Interpretation None      MDM   Final diagnoses:  Nausea  UTI (lower urinary tract infection)    Patient presents with generalized fatigue over the past few months with episode of diaphoresis and nausea this morning. Denies fever, shortness of breath, chest pain. VSS. Exam unremarkable. Pregnancy negative. UA revealed large hgb, RBCs too numerous to count, small leuks and many bacteria; pt reports that she is currently on her menstrual cycle. EKG showed normal sinus rhythm. Trop negative. CBC, BMP, TSH and free T4 unremarkable. I have a low suspicion for ACS, PE, dissection, or other acute cardiac event at this time. Plan to d/c pt home with zofran and antibiotics for UTI and advised pt  to follow up with her PCP.   Evaluation does not show pathology requring ongoing emergent intervention or admission. Pt is hemodynamically stable and mentating appropriately. Discussed findings/results and plan with patient/guardian, who agrees with plan. All questions answered. Return precautions discussed and outpatient follow up given.        Chesley Noon [place], [place] 01/01/16 1426  Julianne Rice, MD 01/02/16 [phone]

## 2016-01-02 LAB — URINE CULTURE: Special Requests: NORMAL

## 2016-10-11 ENCOUNTER — Emergency Department (HOSPITAL_COMMUNITY)
Admission: EM | Admit: 2016-10-11 | Discharge: 2016-10-11 | Disposition: A | Discharge status: Home / Self Care | Payer: Self-pay | Attending: Emergency Medicine | Admitting: Emergency Medicine

## 2016-10-11 ENCOUNTER — Emergency Department (HOSPITAL_COMMUNITY): Payer: Self-pay

## 2016-10-11 DIAGNOSIS — Z79899 Other long term (current) drug therapy: Secondary | ICD-10-CM | POA: Insufficient documentation

## 2016-10-11 DIAGNOSIS — I10 Essential (primary) hypertension: Secondary | ICD-10-CM | POA: Insufficient documentation

## 2016-10-11 DIAGNOSIS — J189 Pneumonia, unspecified organism: Secondary | ICD-10-CM | POA: Insufficient documentation

## 2016-10-11 LAB — CBC WITH DIFFERENTIAL/PLATELET
BASOS ABS: 0 10*3/uL (ref 0.0–0.1)
BASOS PCT: 0 %
EOS ABS: 0.4 10*3/uL (ref 0.0–0.7)
EOS PCT: 4 %
HEMATOCRIT: 41.2 % (ref 36.0–46.0)
Hemoglobin: 13.7 g/dL (ref 12.0–15.0)
Lymphocytes Relative: 16 %
Lymphs Abs: 1.5 10*3/uL (ref 0.7–4.0)
MCH: 30.8 pg (ref 26.0–34.0)
MCHC: 33.3 g/dL (ref 30.0–36.0)
MCV: 92.6 fL (ref 78.0–100.0)
MONO ABS: 0.6 10*3/uL (ref 0.1–1.0)
Monocytes Relative: 7 %
NEUTROS ABS: 6.8 10*3/uL (ref 1.7–7.7)
Neutrophils Relative %: 73 %
PLATELETS: 290 10*3/uL (ref 150–400)
RBC: 4.45 MIL/uL (ref 3.87–5.11)
RDW: 12.5 % (ref 11.5–15.5)
WBC: 9.4 10*3/uL (ref 4.0–10.5)

## 2016-10-11 LAB — I-STAT CHEM 8, ED
BUN: 9 mg/dL (ref 6–20)
CREATININE: 0.7 mg/dL (ref 0.44–1.00)
Calcium, Ion: 1.13 mmol/L — ABNORMAL LOW (ref 1.15–1.40)
Chloride: 101 mmol/L (ref 101–111)
Glucose, Bld: 99 mg/dL (ref 65–99)
HEMATOCRIT: 42 % (ref 36.0–46.0)
HEMOGLOBIN: 14.3 g/dL (ref 12.0–15.0)
Potassium: 3.8 mmol/L (ref 3.5–5.1)
Sodium: 138 mmol/L (ref 135–145)
TCO2: 26 mmol/L (ref 0–100)

## 2016-10-11 LAB — URINE MICROSCOPIC-ADD ON

## 2016-10-11 LAB — URINALYSIS, ROUTINE W REFLEX MICROSCOPIC
Bilirubin Urine: NEGATIVE
Glucose, UA: NEGATIVE mg/dL
KETONES UR: NEGATIVE mg/dL
LEUKOCYTES UA: NEGATIVE
NITRITE: NEGATIVE
PH: 6 (ref 5.0–8.0)
Protein, ur: 100 mg/dL — AB
SPECIFIC GRAVITY, URINE: 1.015 (ref 1.005–1.030)

## 2016-10-11 LAB — I-STAT BETA HCG BLOOD, ED (MC, WL, AP ONLY)

## 2016-10-11 MED ORDER — SODIUM CHLORIDE 0.9 % IV BOLUS (SEPSIS)
1000.0000 mL | Freq: Once | INTRAVENOUS | Status: AC
  Administered 2016-10-11: 1000 mL via INTRAVENOUS

## 2016-10-11 MED ORDER — AMOXICILLIN-POT CLAVULANATE 875-125 MG PO TABS
1.0000 | ORAL_TABLET | Freq: Once | ORAL | Status: AC
  Administered 2016-10-11: 1 via ORAL
  Filled 2016-10-11: qty 1

## 2016-10-11 MED ORDER — AMOXICILLIN-POT CLAVULANATE 875-125 MG PO TABS: 1.0000 | ORAL_TABLET | Freq: Two times a day (BID) | ORAL | 0 refills | Status: DC

## 2016-10-11 MED ORDER — ACETAMINOPHEN 500 MG PO TABS
1000.0000 mg | ORAL_TABLET | Freq: Once | ORAL | Status: AC
  Administered 2016-10-11: 1000 mg via ORAL
  Filled 2016-10-11: qty 2

## 2016-10-11 NOTE — ED Triage Notes (Signed)
Pt presents from home with generalized body aches since Saturday. Noticed blood when wiping after urination last week. Pt denies fever, abdominal pain. States felt this way before and had a UTI

## 2016-10-11 NOTE — ED Provider Notes (Signed)
[facility] DEPT Provider Note   CSN: [id number] Arrival date & time: 10/11/16  0604     History   Chief Complaint Chief Complaint  Patient presents with  . Generalized Body Aches    HPI Janet [name] is a 45 y.o. female.  Patient presents to the ED with a chief complaint of generalized body aches, fever, and cough. She states that the symptoms started on Friday.  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  She did not get a flu shot this year.  She reports mild productive cough, for which she has taken mucinex with some relief.  She states that she has not taken anything else for her symptoms.  There are no other associated symptoms.  She states that last year she felt similar and was diagnosed with a UTI, although she denies dysuria, hematuria, or frequency.   The history is provided by the patient. No language interpreter was used.    Past Medical History:  Diagnosis Date  . ABFND PAP SMEAR HGSIL 08/22/2007  . Anemia due to chronic blood loss - reports history of evaluation and told due to menorrhagia 05/30/2014  . ANXIETY 08/22/2007  . Anxiety and depression   . Frequent headaches   . GERD 08/22/2007  . Hyperglycemia   . Hyperlipemia   . HYPERTENSION, BENIGN ESSENTIAL 08/22/2007  . 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 Obesity 02/03/2015  . PLANTAR FASCIITIS, RIGHT 07/01/2008  . PSORIASIS 08/22/2007    Patient Active Problem List   Diagnosis Date Noted  . Anxiety and depression 02/03/2015  . Hyperlipemia 02/03/2015  . Hyperglycemia 02/03/2015  . Obesity 02/03/2015  . Anemia due to chronic blood loss - reports history of evaluation and told due to menorrhagia 05/30/2014  . 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 HYPERTENSION, BENIGN ESSENTIAL 08/22/2007  . GERD 08/22/2007  . ABFND PAP SMEAR HGSIL 08/22/2007    Past Surgical History:  Procedure Laterality Date  . LEEP  2009   CIN I negative margin  . SKIN CANCER  EXCISION  2010   chest area    OB History    Gravida Para Term Preterm AB Living   1 1 1     1    SAB TAB Ectopic Multiple Live Births           1       Home Medications    Prior to Admission medications   Medication Sig Start Date End Date Taking? Authorizing Provider  hydrochlorothiazide (HYDRODIURIL) 12.5 MG tablet Take 1 tablet (12.5 mg total) by mouth daily. Patient not taking: Reported on 10/11/2016 08/01/14   Lucretia Kern, DO  nitrofurantoin, macrocrystal-monohydrate, (MACROBID) 100 MG capsule Take 1 capsule (100 mg total) by mouth 2 (two) times daily. Patient not taking: Reported on 10/11/2016 01/01/16   Nona Dell, PA-C  norethindrone (MICRONOR,CAMILA,ERRIN) 0.35 MG tablet Take 1 tablet (0.35 mg total) by mouth daily. Patient not taking: Reported on 10/11/2016 07/29/14   Elveria Rising, MD  ondansetron (ZOFRAN ODT) 4 MG disintegrating tablet Take 1 tablet (4 mg total) by mouth every 8 (eight) hours as needed for nausea or vomiting. Patient not taking: Reported on 10/11/2016 01/01/16   Nona Dell, PA-C  PARoxetine (PAXIL) 20 MG tablet Take 1 tablet (20 mg total) by mouth daily. Patient not taking: Reported on 10/11/2016 02/03/15   Lucretia Kern, DO    Family History Family History  Problem Relation Age of  Onset  . Diabetes Mother   . Hypertension Mother   . Heart disease Father   . Hypertension Father   . Diabetes Brother   . Hypertension Brother   . Diabetes Brother   . Fibroids Mother   . Heart attack Father     Social History Social History  Substance Use Topics  . Smoking status: Never Smoker  . Smokeless tobacco: Not on file  . Alcohol use No     Allergies   Lidocaine and Penicillins   Review of Systems Review of Systems  Constitutional: Positive for fever.  Respiratory: Positive for cough.   Genitourinary: Negative for dysuria, frequency and hematuria.  All other systems reviewed and are negative.    Physical Exam Updated  Vital Signs BP 183/90 (BP Location: Right Arm)   Pulse 97   Temp 100 F (37.8 C) (Oral)   Resp 18   Ht 5\' 5"  (1.651 m)   Wt 113.4 kg   SpO2 95%   BMI 41.60 kg/m   Physical Exam  Constitutional: She is oriented to person, place, and time. She appears well-developed and well-nourished.  HENT:  Head: Normocephalic and atraumatic.  Eyes: Conjunctivae and EOM are normal. Pupils are equal, round, and reactive to light.  Neck: Normal range of motion. Neck supple.  Cardiovascular: Normal rate and regular rhythm.  Exam reveals no gallop and no friction rub.   No murmur heard. Pulmonary/Chest: Effort normal and breath sounds normal. No respiratory distress. She has no wheezes. She has no rales. She exhibits no tenderness.  Abdominal: Soft. Bowel sounds are normal. She exhibits no distension and no mass. There is no tenderness. There is no rebound and no guarding.  Musculoskeletal: Normal range of motion. She exhibits no edema or tenderness.  Neurological: She is alert and oriented to person, place, and time.  Skin: Skin is warm and dry.  Psychiatric: She has a normal mood and affect. Her behavior is normal. Judgment and thought content normal.  Nursing note and vitals reviewed.    ED Treatments / Results  Labs (all labs ordered are listed, but only abnormal results are displayed) Labs Reviewed  URINE CULTURE  URINALYSIS, ROUTINE W REFLEX MICROSCOPIC (NOT AT [facility])    EKG  EKG Interpretation None       Radiology Dg Chest 2 View  Result Date: 10/11/2016 CLINICAL DATA:  Cough and chest congestion for the past 2 days EXAM: CHEST  2 VIEW COMPARISON:  None in PACs FINDINGS: The lungs are borderline hypoinflated. There is infiltrate in the lower lobes bilaterally. The heart is mildly enlarged. The perihilar interstitial markings are coarse. The pulmonary vascularity is not clearly engorged. There is no pleural effusion. The bony thorax exhibits no acute abnormality. There is  calcification in the wall of the aortic arch. IMPRESSION: Bibasilar atelectasis or pneumonia. Followup PA and lateral chest X-ray is recommended in 3-4 weeks following trial of antibiotic therapy to ensure resolution and exclude underlying malignancy. Aortic atherosclerosis. Electronically Signed   By: David  Martinique M.D.   On: 10/11/2016 07:18    Procedures Procedures (including critical care time)  Medications Ordered in ED Medications  acetaminophen (TYLENOL) tablet 1,000 mg (not administered)     Initial Impression / Assessment and Plan / ED Course  I have reviewed the triage vital signs and the nursing notes.  Pertinent labs & imaging results that were available during my care of the patient were reviewed by me and considered in my medical decision making (see chart  for details).  Clinical Course    Patient with generalized body aches x 3 days and cough.  States she felt similar about a year ago and had a UTI, but denies any dysuria or hematuria.  UA is inconsistent with UTI.  Does have some RBCs and a small amount of protein.  Will give some fluids and check kidney function.  Labs are reassuring. DC to home with Augmentin and PCP follow-up.  Repeat CXR in 3-4 weeks.  Final Clinical Impressions(s) / ED Diagnoses   Final diagnoses:  Community acquired pneumonia, unspecified laterality    New Prescriptions New Prescriptions   No medications on file     Montine Circle, PA-C 10/11/16 [place], MD 10/12/16 [phone]

## 2016-10-11 NOTE — Discharge Instructions (Signed)
You have been diagnosed with pneumonia.  Please take antibiotics as directed.  Return to the ER if you worsen.  Please follow-up with your doctor otherwise.  Additionally, it is recommended that you get a repeat chest x-ray in 3-4 weeks to ensure resolution of pneumonia.

## 2016-10-12 LAB — URINE CULTURE

## 2016-12-14 ENCOUNTER — Encounter: Payer: Self-pay | Admitting: Internal Medicine

## 2016-12-14 ENCOUNTER — Ambulatory Visit: Payer: Self-pay | Attending: Internal Medicine | Admitting: Internal Medicine

## 2016-12-14 VITALS — BP 163/99 | HR 91 | Temp 98.8°F | Resp 16 | Wt 280.6 lb

## 2016-12-14 DIAGNOSIS — Z0000 Encounter for general adult medical examination without abnormal findings: Secondary | ICD-10-CM

## 2016-12-14 DIAGNOSIS — Z880 Allergy status to penicillin: Secondary | ICD-10-CM | POA: Insufficient documentation

## 2016-12-14 DIAGNOSIS — Z833 Family history of diabetes mellitus: Secondary | ICD-10-CM | POA: Insufficient documentation

## 2016-12-14 DIAGNOSIS — Z8249 Family history of ischemic heart disease and other diseases of the circulatory system: Secondary | ICD-10-CM | POA: Insufficient documentation

## 2016-12-14 DIAGNOSIS — Z6841 Body Mass Index (BMI) 40.0 and over, adult: Secondary | ICD-10-CM | POA: Insufficient documentation

## 2016-12-14 DIAGNOSIS — Z131 Encounter for screening for diabetes mellitus: Secondary | ICD-10-CM

## 2016-12-14 DIAGNOSIS — B350 Tinea barbae and tinea capitis: Secondary | ICD-10-CM

## 2016-12-14 DIAGNOSIS — E785 Hyperlipidemia, unspecified: Secondary | ICD-10-CM | POA: Insufficient documentation

## 2016-12-14 DIAGNOSIS — Z888 Allergy status to other drugs, medicaments and biological substances status: Secondary | ICD-10-CM | POA: Insufficient documentation

## 2016-12-14 DIAGNOSIS — Z85820 Personal history of malignant melanoma of skin: Secondary | ICD-10-CM | POA: Insufficient documentation

## 2016-12-14 DIAGNOSIS — E669 Obesity, unspecified: Secondary | ICD-10-CM | POA: Insufficient documentation

## 2016-12-14 DIAGNOSIS — L409 Psoriasis, unspecified: Secondary | ICD-10-CM | POA: Insufficient documentation

## 2016-12-14 DIAGNOSIS — I10 Essential (primary) hypertension: Secondary | ICD-10-CM

## 2016-12-14 DIAGNOSIS — K219 Gastro-esophageal reflux disease without esophagitis: Secondary | ICD-10-CM

## 2016-12-14 DIAGNOSIS — R7303 Prediabetes: Secondary | ICD-10-CM | POA: Insufficient documentation

## 2016-12-14 LAB — POCT GLYCOSYLATED HEMOGLOBIN (HGB A1C): Hemoglobin A1C: 5.5

## 2016-12-14 MED ORDER — TRIAMCINOLONE ACETONIDE 0.5 % EX OINT: 1.0000 "application " | TOPICAL_OINTMENT | Freq: Two times a day (BID) | CUTANEOUS | 0 refills | Status: DC

## 2016-12-14 MED ORDER — HYDROCHLOROTHIAZIDE 25 MG PO TABS: 25.0000 mg | ORAL_TABLET | Freq: Every day | ORAL | 3 refills | Status: DC

## 2016-12-14 MED ORDER — FAMOTIDINE 20 MG PO TABS: 20.0000 mg | ORAL_TABLET | Freq: Two times a day (BID) | ORAL | 3 refills | Status: DC

## 2016-12-14 MED ORDER — CLOTRIMAZOLE 1 % EX OINT: 1.0000 "application " | TOPICAL_OINTMENT | Freq: Two times a day (BID) | CUTANEOUS | 2 refills | Status: DC

## 2016-12-14 MED FILL — HYDROCHLOROTHIAZIDE 25 MG T: 25 | 30 days supply | Qty: 30 | Fill #0

## 2016-12-14 MED FILL — TRIAMCINOLONE 0.5% OINTMENT: 0.5 | 20 days supply | Qty: 30 | Fill #0

## 2016-12-14 MED FILL — FAMOTIDINE 20 MG TABLET: 20 | 30 days supply | Qty: 60 | Fill #0

## 2016-12-14 NOTE — Addendum Note (Signed)
Addended by: Boykin Reaper R on: 12/14/2016 03:08 PM   Modules accepted: Orders

## 2016-12-14 NOTE — Progress Notes (Signed)
Janet [name], is a 46 y.o. female  [id number]  [id number]  DOB - [birth year]  CC:  Chief Complaint  Patient presents with  . Establish Care       HPI: Janet [name] is a 46 y.o. female here today to establish medical care.  PMH depression, HTN, HLD, melanoma, prediabetes, GERD and abnormal pap with poor compliance with follow up and recommendations, per Dr Janet [name], Janet [name] pcp note on 01/24/15.  Pt here today to set up care. Seen in ED 10/11/16, currently uninsured.  Was treated for pna, and has since felt better. Denies current cough/f/c/n/v/d.  Does not watch her weight or what she eats, but is adding less salt than prior. Not on any current meds. Denies tob or etoh.  C/o of heartburn, does drink a lot of sodas.   C/o of severe pruritis in her head, constantly for years, worse this past year. Has tried otc shampoos w/ selenium w/o improvement. Rash on legs as well, worse w/ shaving, itchy at times. Denies bedbugs   Patient has No headache, No chest pain, No abdominal pain - No Nausea, No new weakness tingling or numbness, No Cough - SOB.    Review of Systems: Per hpi, o/w all systems reviewed and negative.   Allergies  Allergen Reactions  . Lidocaine     ? Lidocaine allergy? Reports heart racing when has "shot of medicine in cevix" for leep  . Penicillins Itching   Past Medical History:  Diagnosis Date  . ABFND PAP SMEAR HGSIL 08/22/2007  . Anemia due to chronic blood loss - reports history of evaluation and told due to menorrhagia 05/30/2014  . ANXIETY 08/22/2007  . Anxiety and depression   . Frequent headaches   . GERD 08/22/2007  . Hyperglycemia   . Hyperlipemia   . HYPERTENSION, BENIGN ESSENTIAL 08/22/2007  . 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 Obesity 02/03/2015  . PLANTAR FASCIITIS, RIGHT 07/01/2008  . PSORIASIS 08/22/2007   Current Outpatient Prescriptions on File Prior to Visit  Medication Sig Dispense Refill  . nitrofurantoin, macrocrystal-monohydrate, (MACROBID)  100 MG capsule Take 1 capsule (100 mg total) by mouth 2 (two) times daily. (Patient not taking: Reported on 12/14/2016) 10 capsule 0  . norethindrone (MICRONOR,CAMILA,ERRIN) 0.35 MG tablet Take 1 tablet (0.35 mg total) by mouth daily. (Patient not taking: Reported on 12/14/2016) 3 Package 3  . ondansetron (ZOFRAN ODT) 4 MG disintegrating tablet Take 1 tablet (4 mg total) by mouth every 8 (eight) hours as needed for nausea or vomiting. (Patient not taking: Reported on 12/14/2016) 10 tablet 0   No current facility-administered medications on file prior to visit.    Family History  Problem Relation Age of Onset  . Diabetes Mother   . Hypertension Mother   . Heart disease Father   . Hypertension Father   . Diabetes Brother   . Hypertension Brother   . Diabetes Brother   . Fibroids Mother   . Heart attack Father    Social History   Social History  . Marital status: Single    Spouse name: N/A  . Number of children: N/A  . Years of education: N/A   Occupational History  . Not on file.   Social History Main Topics  . Smoking status: Never Smoker  . Smokeless tobacco: Not on file  . Alcohol use No  . Drug use: No  . Sexual activity: No   Other Topics Concern  . Not on file   Social History Narrative   Work  or School: daycare in infant room      Home Situation: lives alone      Spiritual Beliefs: none      Lifestyle: no regular exercise; diet is poor             Objective:   Vitals:   12/14/16 1351  BP: (!) 163/99  Pulse: 91  Resp: 16  Temp: 98.8 F (37.1 C)    Filed Weights   12/14/16 1351  Weight: 280 lb 9.6 oz (127.3 kg)    BP Readings from Last 3 Encounters:  12/14/16 (!) 163/99  10/11/16 151/77  01/01/16 143/80    Physical Exam: Constitutional: Patient appears well-developed and well-nourished. No distress. AAOx3, obese.  HENT: Normocephalic, atraumatic, External right and left ear normal. Oropharynx is clear and moist.  bilat TMs clear.  Dry scalp  throughout w/ noted dandruff. Eyes: Conjunctivae and EOM are normal. PERRL, no scleral icterus. Neck: Normal ROM. Neck supple. No JVD. CVS: RRR, S1/S2 +, no murmurs, no gallops, no carotid bruit.  Pulmonary: Effort and breath sounds normal, no stridor, rhonchi, wheezes, rales.  Abdominal: Soft. BS +, obese, no distension, tenderness, rebound or guarding.  Musculoskeletal: Normal range of motion. No edema and no tenderness.  LE: bilat/ no c/c/e, pulses 2+ bilateral. Neuro: Alert. muscle tone coordination wnl. No cranial nerve deficit grossly. Skin: Skin is warm and dry. Not diaphoretic.  No pallor.  bilat le w/ patches of erythema/raised rash w/ excoriations, also noted very dry skin, and noted some intermittent lesions on upper arms too. Psychiatric: Normal mood and affect. Behavior, judgment, thought content normal.  Lab Results  Component Value Date   WBC 9.4 10/11/2016   HGB 14.3 10/11/2016   HCT 42.0 10/11/2016   MCV 92.6 10/11/2016   PLT 290 10/11/2016   Lab Results  Component Value Date   CREATININE 0.70 10/11/2016   BUN 9 10/11/2016   NA 138 10/11/2016   K 3.8 10/11/2016   CL 101 10/11/2016   CO2 24 01/01/2016    Lab Results  Component Value Date   HGBA1C 6.3 10/23/2014   Lipid Panel     Component Value Date/Time   CHOL 198 05/30/2014 1015   TRIG 120.0 05/30/2014 1015   HDL 60.80 05/30/2014 1015   CHOLHDL 3 05/30/2014 1015   VLDL 24.0 05/30/2014 1015   LDLCALC 113 (H) 05/30/2014 1015        Depression screen PHQ 2/9 12/14/2016  Decreased Interest 1  Down, Depressed, Hopeless 0  PHQ - 2 Score 1    Assessment and plan:   1. Hypertension, unspecified type Uncontrolled, low salt diet discussed, info provided - started on hctz 25 qd   2. Morbid obesity ([place]) Low carb diet rec, info on lc/hf/if diet provided.  Increase activity recd.  3. Gastroesophageal reflux disease, esophagitis presence not specified Severe, drinks pickle juice sometimes to help w/  her heartburn - gerd diet recd - trial pepcid 40 qd.  4. Tinea capitis ?  - trial clotrimazole ointment  5. Pruritic rash, bilat le, w/ excoriations. - trial kenalog cream  6. Health care maintenance - Lipid Panel - TSH - aic 5.5   Return in about 3 months (around 03/14/2017), or if symptoms worsen or fail to improve.  The patient was given clear instructions to go to ER or return to medical center if symptoms don't improve, worsen or new problems develop. The patient verbalized understanding. The patient was told to call to get lab results if  they haven't heard anything in the next week.    This note has been created with Surveyor, quantity. Any transcriptional errors are unintentional.   Maren Reamer, MD, [place], [place]   12/14/2016, 2:19 PM

## 2016-12-14 NOTE — Patient Instructions (Addendum)
- financial aid packet.  -  Low-Sodium Eating Plan Sodium raises blood pressure and causes water to be held in the body. Getting less sodium from food will help lower your blood pressure, reduce any swelling, and protect your heart, liver, and kidneys. We get sodium by adding salt (sodium chloride) to food. Most of our sodium comes from canned, boxed, and frozen foods. Restaurant foods, fast foods, and pizza are also very high in sodium. Even if you take medicine to lower your blood pressure or to reduce fluid in your body, getting less sodium from your food is important. What is my plan? Most people should limit their sodium intake to 2,300 mg a day. Your health care provider recommends that you limit your sodium intake to 2,000mg  a day. What do I need to know about this eating plan? For the low-sodium eating plan, you will follow these general guidelines:  Choose foods with a % Daily Value for sodium of less than 5% (as listed on the food label).  Use salt-free seasonings or herbs instead of table salt or sea salt.  Check with your health care provider or pharmacist before using salt substitutes.  Eat fresh foods.  Eat more vegetables and fruits.  Limit canned vegetables. If you do use them, rinse them well to decrease the sodium.  Limit cheese to 1 oz (28 g) per day.  Eat lower-sodium products, often labeled as "lower sodium" or "no salt added."  Avoid foods that contain monosodium glutamate (MSG). MSG is sometimes added to [place] food and some canned foods.  Check food labels (Nutrition Facts labels) on foods to learn how much sodium is in one serving.  Eat more home-cooked food and less restaurant, buffet, and fast food.  When eating at a restaurant, ask that your food be prepared with less salt, or no salt if possible. How do I read food labels for sodium information? The Nutrition Facts label lists the amount of sodium in one serving of the food. If you eat more than one  serving, you must multiply the listed amount of sodium by the number of servings. Food labels may also identify foods as:  Sodium free-Less than 5 mg in a serving.  Very low sodium-35 mg or less in a serving.  Low sodium-140 mg or less in a serving.  Light in sodium-50% less sodium in a serving. For example, if a food that usually has 300 mg of sodium is changed to become light in sodium, it will have 150 mg of sodium.  Reduced sodium-25% less sodium in a serving. For example, if a food that usually has 400 mg of sodium is changed to reduced sodium, it will have 300 mg of sodium. What foods can I eat? Grains  Low-sodium cereals, including oats, puffed wheat and rice, and shredded wheat cereals. Low-sodium crackers. Unsalted rice and pasta. Lower-sodium bread. Vegetables  Frozen or fresh vegetables. Low-sodium or reduced-sodium canned vegetables. Low-sodium or reduced-sodium tomato sauce and paste. Low-sodium or reduced-sodium tomato and vegetable juices. Fruits  Fresh, frozen, and canned fruit. Fruit juice. Meat and Other Protein Products  Low-sodium canned tuna and salmon. Fresh or frozen meat, poultry, seafood, and fish. Lamb. Unsalted nuts. Dried beans, peas, and lentils without added salt. Unsalted canned beans. Homemade soups without salt. Eggs. Dairy  Milk. Soy milk. Ricotta cheese. Low-sodium or reduced-sodium cheeses. Yogurt. Condiments  Fresh and dried herbs and spices. Salt-free seasonings. Onion and garlic powders. Low-sodium varieties of mustard and ketchup. Fresh or refrigerated horseradish.  Lemon juice. Fats and Oils  Reduced-sodium salad dressings. Unsalted butter. Other  Unsalted popcorn and pretzels. The items listed above may not be a complete list of recommended foods or beverages. Contact your dietitian for more options.  What foods are not recommended? Grains  Instant hot cereals. Bread stuffing, pancake, and biscuit mixes. Croutons. Seasoned rice or pasta mixes.  Noodle soup cups. Boxed or frozen macaroni and cheese. Self-rising flour. Regular salted crackers. Vegetables  Regular canned vegetables. Regular canned tomato sauce and paste. Regular tomato and vegetable juices. Frozen vegetables in sauces. Salted [place] fries. Olives. Angie Fava. Relishes. Sauerkraut. Salsa. Meat and Other Protein Products  Salted, canned, smoked, spiced, or pickled meats, seafood, or fish. Bacon, ham, sausage, hot dogs, corned beef, chipped beef, and packaged luncheon meats. Salt pork. Jerky. Pickled herring. Anchovies, regular canned tuna, and sardines. Salted nuts. Dairy  Processed cheese and cheese spreads. Cheese curds. Blue cheese and cottage cheese. Buttermilk. Condiments  Onion and garlic salt, seasoned salt, table salt, and sea salt. Canned and packaged gravies. Worcestershire sauce. Tartar sauce. Barbecue sauce. Teriyaki sauce. Soy sauce, including reduced sodium. Steak sauce. Fish sauce. Oyster sauce. Cocktail sauce. Horseradish that you find on the shelf. Regular ketchup and mustard. Meat flavorings and tenderizers. Bouillon cubes. Hot sauce. Tabasco sauce. Marinades. Taco seasonings. Relishes. Fats and Oils  Regular salad dressings. Salted butter. Margarine. Ghee. Bacon fat. Other  Potato and tortilla chips. Corn chips and puffs. Salted popcorn and pretzels. Canned or dried soups. Pizza. Frozen entrees and pot pies. The items listed above may not be a complete list of foods and beverages to avoid. Contact your dietitian for more information.  This information is not intended to replace advice given to you by your health care provider. Make sure you discuss any questions you have with your health care provider. Document Released: 05/14/2002 Document Revised: 04/29/2016 Document Reviewed: 09/26/2013 Elsevier Interactive Patient Education  2017 Elsevier Inc.  -  Hypertension Hypertension is another name for high blood pressure. High blood pressure forces your heart to  work harder to pump blood. A blood pressure reading has two numbers, which includes a higher number over a lower number (example: 110/72). Follow these instructions at home:  Have your blood pressure rechecked by your doctor.  Only take medicine as told by your doctor. Follow the directions carefully. The medicine does not work as well if you skip doses. Skipping doses also puts you at risk for problems.  Do not smoke.  Monitor your blood pressure at home as told by your doctor. Contact a doctor if:  You think you are having a reaction to the medicine you are taking.  You have repeat headaches or feel dizzy.  You have puffiness (swelling) in your ankles.  You have trouble with your vision. Get help right away if:  You get a very bad headache and are confused.  You feel weak, numb, or faint.  You get chest or belly (abdominal) pain.  You throw up (vomit).  You cannot breathe very well. This information is not intended to replace advice given to you by your health care provider. Make sure you discuss any questions you have with your health care provider. Document Released: 05/10/2008 Document Revised: 04/29/2016 Document Reviewed: 09/14/2013 Elsevier Interactive Patient Education  2017 [place] START PATIENT GUIDE TO LCHF/IF LOW CARB HIGH FAT / INTERMITTENT FASTING  Recommend: <50 gram carbohydrate a day for weightloss.  What is this diet and how does it work? o  Insulin is a hormone made by your body that allows you to use sugar (glucose) from carbohydrates in the food you eat for energy or to store glucose (as fat) for future use  o Insulin levels need to be lowered in order to utilize our stored energy (fat) o Many struggling with obesity are insulin resistant and have high levels of insulin o This diet works to lower your insulin in two ways o Fasting - allows your insulin levels to naturally decrease  o Avoiding carbohydrates - carbs trigger increase  in insulin Low Carb Healthy Fat (LCHF) o Get a free app for your phone, such as MyFitnessPal, to help you track your macronutrients (carbs/protein/fats) and to track your weight and body measurements to see your progress o Set your goal for around 10% carbs/20% protein/70% fat o A good starting goal for amount of net carbs per day is 50 grams (some will aim for 20 grams) o "Net carbs" refers to total grams of carbs minus grams of fiber (as fiber is not typically absorbed). For example, if a food has 5g total carb and 3g fiber, that would be 2g net carbs o Increase healthy fats - eg. olive oil, eggs, nuts, avocado, cheese, butter, coconut, meats, fish o Avoid high carb foods - eg. bread, pasta, potatoes, rice, cookies, soda, juice, anything sugary o Buy full-fat ingredients (avoid low-fat versions, which often have more sugar) o No need to count calories, but pay close attention to grams of carbs on labels Intermittent Fasting (IF) o "Fasting" is going a period of time without eating - it helps to stay busy and well-hydrated o Purpose of fasting is to allow insulin levels to drop as low as possible, allowing your body to switch into fat-burning mode o With this diet there are many approaches to fasting, but 16:8 and 24hr fasts are commonly used o 16:8 fast, usually 5-7 days a week - Fasting for 16 hours of the day, then eating all meals for the day over course of 8 hours. o 24 hour fast, usually 1-3 days a week - Typically eating one meal a day, then fasting until the next day. Plenty of fluids (and some salt to help you hold onto fluids) are recommended during longer fasts.  o During fasts certain beverages are still acceptable - water, sparkling water, bone broth, black tea or coffee, or tea/coffee with small amount of heavy whipping cream Special note for those on diabetic medications o Discuss your medications with your physician. You may need to hold your medication or adjust to only taking  when eating. Diabetics should keep close track of their blood sugars when making any changes to diet/meds, to ensure they are staying within normal limits For more info about LCHF/IF o Watch "Therapeutic Fasting - Solving the Two-Compartment Problem" video by Dr. Sharman Cheek on YouTube (GreatestGyms.com.ee) for a great intro to these concepts o Read "The Obesity Code" and/or "The Complete Guide to Fasting" by Dr. Sharman Cheek o Go to www.dietdoctor.com for explanations, recipes, and infographics about foods to eat/avoid o Get a Free smartphone app that helps count carbohydrates  - ie MyKeto EXAMPLES TO GET STARTED Fasting Beverages -water (can add  tsp [place] salt once or twice a day to help stay hydrated for longer fasts) -Sparkling water (such as [employer] or similar; avoid any with artificial sweeteners)  -Bone broth (multiple recipes available online or can buy pre-made) -Tea or Coffee (Adding heavy whipping cream or coconut oil to your  tea or coffee can be helpful if you find yourself getting too hungry during the fasts. Can also add cinnamon for flavor. Or "bulletproof coffee.") Low Carb Healthy Fat Breakfast (if not fasting) -eggs in butter or olive oil with avocado -omelet with veggies and cheese  Lunch -hamburger with cheese and avocado wrapped in lettuce (no bun, no ketchup) -meat and cheese wrapped in lettuce (can dip in mustard or olive oil/vinegar/mayo) -salad with meat/cheese/nuts and higher fat dressing (vinaigrette or Ranch, etc) -tuna salad lettuce wrap -taco meat with cheese, sour cream, guacamole, cheese over lettuce  Dinner -steak with herb butter or [place] sauce -"Fathead" pizza (uses cheese and almond flour for the dough - several recipes available online) -roasted or grilled chicken with skin on, with low carb sauce (buffalo, garlic butter, alfredo, pesto, etc) -baked salmon with lemon butter -chicken alfredo with zucchini  noodles -[place] butter chicken with low carb garlic naan -egg roll in a bowl  Side Dishes -mashed cauliflower (homemade or available in freezer section) -roast vegetables (green veggies that grow above ground rather than root veggies) with butter or cheese -Caprese salad (fresh mozzarella, tomato and basil with olive oil) -homemade low-carb coleslaw Snacks/Desserts (try to avoid unnecessary snacking and sweets in general) -celery or cucumber dipped in guacamole or sour cream dip -cheese and meat slices  -raspberries with whipped cream (can make homemade with no sugar added) -low carb [place] butter cake  AVOID - sugar, diet/regular soda, potatoes, breads, rice, pasta, candy, cookies, cakes, muffins, juice, high carb fruit (bananas, grapes), beer, ketchup, barbeque and other sweet sauces  -  Food Choices for Gastroesophageal Reflux Disease, Adult When you have gastroesophageal reflux disease (GERD), the foods you eat and your eating habits are very important. Choosing the right foods can help ease your discomfort. What guidelines do I need to follow?  Choose fruits, vegetables, whole grains, and low-fat dairy products.  Choose low-fat meat, fish, and poultry.  Limit fats such as oils, salad dressings, butter, nuts, and avocado.  Keep a food diary. This helps you identify foods that cause symptoms.  Avoid foods that cause symptoms. These may be different for everyone.  Eat small meals often instead of 3 large meals a day.  Eat your meals slowly, in a place where you are relaxed.  Limit fried foods.  Cook foods using methods other than frying.  Avoid drinking alcohol.  Avoid drinking large amounts of liquids with your meals.  Avoid bending over or lying down until 2-3 hours after eating. What foods are not recommended? These are some foods and drinks that may make your symptoms worse: Vegetables  Tomatoes. Tomato juice. Tomato and spaghetti sauce. Chili peppers. Onion  and garlic. Horseradish. Fruits  Oranges, grapefruit, and lemon (fruit and juice). Meats  High-fat meats, fish, and poultry. This includes hot dogs, ribs, ham, sausage, salami, and bacon. Dairy  Whole milk and chocolate milk. Sour cream. Cream. Butter. Ice cream. Cream cheese. Drinks  Coffee and tea. Bubbly (carbonated) drinks or energy drinks. Condiments  Hot sauce. Barbecue sauce. Sweets/Desserts  Chocolate and cocoa. Donuts. Peppermint and spearmint. Fats and Oils  High-fat foods. This includes [place] fries and potato chips. Other  Vinegar. Strong spices. This includes black pepper, white pepper, red pepper, cayenne, curry powder, cloves, ginger, and chili powder. The items listed above may not be a complete list of foods and drinks to avoid. Contact your dietitian for more information.  This information is not intended to replace advice given to you by  your health care provider. Make sure you discuss any questions you have with your health care provider. Document Released: 05/23/2012 Document Revised: 04/29/2016 Document Reviewed: 09/26/2013 Elsevier Interactive Patient Education  2017 [employer].

## 2016-12-15 ENCOUNTER — Other Ambulatory Visit: Payer: Self-pay | Admitting: Internal Medicine

## 2016-12-15 LAB — TSH: TSH: 2.09 m[IU]/L

## 2016-12-15 LAB — LIPID PANEL
Cholesterol: 268 mg/dL — ABNORMAL HIGH (ref <200)
HDL: 67 mg/dL (ref >50)
LDL CALC: 169 mg/dL — AB (ref <100)
Total CHOL/HDL Ratio: 4 Ratio (ref <5.0)
Triglycerides: 161 mg/dL — ABNORMAL HIGH (ref <150)
VLDL: 32 mg/dL — ABNORMAL HIGH (ref <30)

## 2016-12-15 MED ORDER — PRAVASTATIN SODIUM 40 MG PO TABS: 40.0000 mg | ORAL_TABLET | Freq: Every day | ORAL | 3 refills | Status: DC

## 2016-12-15 MED FILL — ?PRAVASTATIN NA 40 MG TAB: 40 MG | 30 days supply | Qty: 30 | Fill #0

## 2016-12-27 ENCOUNTER — Emergency Department (HOSPITAL_COMMUNITY): Payer: Self-pay

## 2016-12-27 ENCOUNTER — Encounter (HOSPITAL_COMMUNITY): Payer: Self-pay | Admitting: *Deleted

## 2016-12-27 ENCOUNTER — Emergency Department (HOSPITAL_COMMUNITY)
Admission: EM | Admit: 2016-12-27 | Discharge: 2016-12-27 | Disposition: A | Discharge status: Home / Self Care | Payer: Self-pay | Attending: Emergency Medicine | Admitting: Emergency Medicine

## 2016-12-27 DIAGNOSIS — I10 Essential (primary) hypertension: Secondary | ICD-10-CM | POA: Insufficient documentation

## 2016-12-27 DIAGNOSIS — Z85820 Personal history of malignant melanoma of skin: Secondary | ICD-10-CM | POA: Insufficient documentation

## 2016-12-27 DIAGNOSIS — B9789 Other viral agents as the cause of diseases classified elsewhere: Secondary | ICD-10-CM

## 2016-12-27 DIAGNOSIS — Z79899 Other long term (current) drug therapy: Secondary | ICD-10-CM | POA: Insufficient documentation

## 2016-12-27 DIAGNOSIS — J069 Acute upper respiratory infection, unspecified: Secondary | ICD-10-CM | POA: Insufficient documentation

## 2016-12-27 MED ORDER — BENZONATATE 100 MG PO CAPS: 100.0000 mg | ORAL_CAPSULE | Freq: Three times a day (TID) | ORAL | 0 refills | Status: DC

## 2016-12-27 MED ORDER — ALBUTEROL SULFATE (2.5 MG/3ML) 0.083% IN NEBU: 5.0000 mg | INHALATION_SOLUTION | RESPIRATORY_TRACT | 0 refills | Status: AC | PRN

## 2016-12-27 MED ORDER — ALBUTEROL SULFATE (2.5 MG/3ML) 0.083% IN NEBU: 5.0000 mg | INHALATION_SOLUTION | Freq: Once | RESPIRATORY_TRACT | Status: DC

## 2016-12-27 NOTE — ED Provider Notes (Signed)
[facility] DEPT Provider Note   CSN: [id number] Arrival date & time: 12/27/16  0700     History   Chief Complaint Chief Complaint  Patient presents with  . Cough    HPI Janet [name] is a 46 y.o. female.  Patient presents with acute onset of body aches and cough, reported low-grade fever to 100F starting 3 days ago. Cough is dry and nonproductive. She reports some wheezing at nighttime only. Patient works around [employer] and thinks she may have contracted an illness there. She denies any chest pain, shortness of breath. Her most significant symptom at this point is body aches. She has been taking ibuprofen and Mucinex without much relief. No lower extremity swelling or tenderness. No history of blood clots. Recently established care with a primary care physician. The onset of this condition was acute. The course is constant. Aggravating factors: none. Alleviating factors: none.        Past Medical History:  Diagnosis Date  . ABFND PAP SMEAR HGSIL 08/22/2007  . Anemia due to chronic blood loss - reports history of evaluation and told due to menorrhagia 05/30/2014  . ANXIETY 08/22/2007  . Anxiety and depression   . Frequent headaches   . GERD 08/22/2007  . Hyperglycemia   . Hyperlipemia   . HYPERTENSION, BENIGN ESSENTIAL 08/22/2007  . 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 Obesity 02/03/2015  . PLANTAR FASCIITIS, RIGHT 07/01/2008  . PSORIASIS 08/22/2007    Patient Active Problem List   Diagnosis Date Noted  . Anxiety and depression 02/03/2015  . Hyperlipemia 02/03/2015  . Hyperglycemia 02/03/2015  . Obesity 02/03/2015  . Anemia due to chronic blood loss - reports history of evaluation and told due to menorrhagia 05/30/2014  . 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 HYPERTENSION, BENIGN ESSENTIAL 08/22/2007  . GERD 08/22/2007  . ABFND PAP SMEAR HGSIL 08/22/2007    Past Surgical History:  Procedure Laterality Date  . LEEP  2009   CIN I negative margin  . SKIN CANCER EXCISION  2010   chest area    OB History    Gravida Para Term Preterm AB Living   1 1 1     1    SAB TAB Ectopic Multiple Live Births           1       Home Medications    Prior to Admission medications   Medication Sig Start Date End Date Taking? Authorizing Provider  albuterol (PROVENTIL) (2.5 MG/3ML) 0.083% nebulizer solution Take 6 mLs (5 mg total) by nebulization every 4 (four) hours as needed for wheezing. 12/27/16   Carlisle Cater, PA-C  benzonatate (TESSALON) 100 MG capsule Take 1 capsule (100 mg total) by mouth every 8 (eight) hours. 12/27/16   Carlisle Cater, PA-C  Clotrimazole 1 % OINT Apply 1 application topically 2 (two) times daily. 12/14/16   Maren Reamer, MD  famotidine (PEPCID) 20 MG tablet Take 1 tablet (20 mg total) by mouth 2 (two) times daily. 12/14/16 12/14/17  Maren Reamer, MD  hydrochlorothiazide (HYDRODIURIL) 25 MG tablet Take 1 tablet (25 mg total) by mouth daily. 12/14/16   Maren Reamer, MD  pravastatin (PRAVACHOL) 40 MG tablet Take 1 tablet (40 mg total) by mouth daily. 12/15/16   Maren Reamer, MD  triamcinolone ointment (KENALOG) 0.5 % Apply 1 application topically 2 (two) times daily. 12/14/16   Maren Reamer, MD    Family History Family History  Problem Relation Age of Onset  . Diabetes Mother   .  Hypertension Mother   . Fibroids Mother   . Heart disease Father   . Hypertension Father   . Heart attack Father   . Diabetes Brother   . Hypertension Brother   . Diabetes Brother     Social History Social History  Substance Use Topics  . Smoking status: Never Smoker  . Smokeless tobacco: Never Used  . Alcohol use No     Allergies   Lidocaine and Penicillins   Review of Systems Review of Systems  Constitutional: Positive for fatigue and fever. Negative for chills.  HENT: Negative for congestion, ear pain, rhinorrhea, sinus pressure and sore throat.   Eyes: Negative for redness.  Respiratory: Positive for cough and wheezing (at night). Negative for  shortness of breath.   Cardiovascular: Negative for chest pain and leg swelling.  Gastrointestinal: Negative for abdominal pain, diarrhea, nausea and vomiting.  Genitourinary: Negative for dysuria.  Musculoskeletal: Positive for myalgias. Negative for neck stiffness.  Skin: Negative for rash.  Neurological: Negative for headaches.  Hematological: Negative for adenopathy.     Physical Exam Updated Vital Signs BP 152/65 (BP Location: Right Arm)   Pulse 108   Temp 98.5 F (36.9 C) (Oral)   Resp 16   Ht 5\' 5"  (1.651 m)   Wt 117.9 kg   LMP 12/12/2016   SpO2 97%   BMI 43.27 kg/m   Physical Exam  Constitutional: She appears well-developed and well-nourished.  HENT:  Head: Normocephalic and atraumatic.  Right Ear: Tympanic membrane, external ear and ear canal normal.  Left Ear: Tympanic membrane, external ear and ear canal normal.  Nose: Nose normal. No mucosal edema or rhinorrhea.  Mouth/Throat: Uvula is midline, oropharynx is clear and moist and mucous membranes are normal. Mucous membranes are not dry. No oral lesions. No trismus in the jaw. No uvula swelling. No oropharyngeal exudate, posterior oropharyngeal edema, posterior oropharyngeal erythema or tonsillar abscesses.  Eyes: Conjunctivae are normal. Right eye exhibits no discharge. Left eye exhibits no discharge.  Neck: Normal range of motion. Neck supple.  Cardiovascular: Regular rhythm and normal heart sounds.   No murmur heard. Mild tachycardia 100 on my exam.  Pulmonary/Chest: Effort normal and breath sounds normal. No respiratory distress. She has no wheezes. She has no rales.  Abdominal: Soft. Bowel sounds are normal. She exhibits no mass. There is no tenderness. There is no guarding.  Lymphadenopathy:    She has no cervical adenopathy.  Neurological: She is alert.  Skin: Skin is warm and dry.  Psychiatric: She has a normal mood and affect.  Nursing note and vitals reviewed.    ED Treatments / Results    Radiology Dg Chest 2 View  Result Date: 12/27/2016 CLINICAL DATA:  Cough. EXAM: CHEST  2 VIEW COMPARISON:  10/11/2016. FINDINGS: Mediastinum and hilar structures are normal. Heart size normal. Low lung volumes. No focal infiltrate. No pleural effusion or pneumothorax . IMPRESSION: Low lung volumes.  No acute cardiopulmonary disease. Electronically Signed   By: Marcello Moores  Register   On: 12/27/2016 07:48    Procedures Procedures (including critical care time)  Medications Ordered in ED Medications  albuterol (PROVENTIL) (2.5 MG/3ML) 0.083% nebulizer solution 5 mg (not administered)     Initial Impression / Assessment and Plan / ED Course  I have reviewed the triage vital signs and the nursing notes.  Pertinent labs & imaging results that were available during my care of the patient were reviewed by me and considered in my medical decision making (see chart for  details).    Patient seen and examined. CXR reviewed with patient. EKG ordered at triage -- after examining the patient I do not feel this is needed. No CP/SOB.    Vital signs reviewed and are as follows: BP 152/65 (BP Location: Right Arm)   Pulse 108   Temp 98.5 F (36.9 C) (Oral)   Resp 16   Ht 5\' 5"  (1.651 m)   Wt 117.9 kg   LMP 12/12/2016   SpO2 97%   BMI 43.27 kg/m   Will d/c with albuterol and tessalon. Patient counseled on supportive care for viral URI and s/s to return including worsening symptoms, persistent fever, persistent vomiting, or if they have any other concerns. Urged to see PCP if symptoms persist for more than 3 days. Patient verbalizes understanding and agrees with plan.    Final Clinical Impressions(s) / ED Diagnoses   Final diagnoses:  Viral URI with cough   Patient with body aches, low-grade fever, cough suggestive of viral respiratory infection. Possible influenza, but other respiratory virus is also possible. Minimal tachycardia but no chest pain, hemoptysis, other risk factors for PE. Patient  does not look to be significantly dehydrated. No pneumonia seen on chest x-ray. Patient is not hypoxic or tachypneic. She appears well, nontoxic. Will treat conservatively.   New Prescriptions New Prescriptions   ALBUTEROL (PROVENTIL) (2.5 MG/3ML) 0.083% NEBULIZER SOLUTION    Take 6 mLs (5 mg total) by nebulization every 4 (four) hours as needed for wheezing.   BENZONATATE (TESSALON) 100 MG CAPSULE    Take 1 capsule (100 mg total) by mouth every 8 (eight) hours.     Carlisle Cater, PA-C 12/27/16 [place], MD 12/31/16 2215

## 2016-12-27 NOTE — ED Triage Notes (Signed)
C/o chest congestion and cough onset fri. C/o generalized bodyaches

## 2016-12-27 NOTE — ED Notes (Signed)
Pt verbalizes understanding of discharge instructions. NAD. A/o x4. Ambulatory at departure.

## 2016-12-27 NOTE — Discharge Instructions (Signed)
Please read and follow all provided instructions.  Your diagnoses today include:  1. Viral URI with cough     Tests performed today include:  Vital signs. See below for your results today.   Chest x-ray - no pneumonia  Medications prescribed:   Tessalon Perles - cough suppressant medication   Albuterol inhaler - medication that opens up your airway  Use inhaler as follows: 1-2 puffs with spacer every 4 hours as needed for wheezing, cough, or shortness of breath.   Take any prescribed medications only as directed. Treatment for your infection is aimed at treating the symptoms. There are no medications, such as antibiotics, that will cure your infection.   Home care instructions:  Follow any educational materials contained in this packet.   Your illness is contagious and can be spread to others, especially during the first 3 or 4 days. It cannot be cured by antibiotics or other medicines. Take basic precautions such as washing your hands often, covering your mouth when you cough or sneeze, and avoiding public places where you could spread your illness to others.   Please continue drinking plenty of fluids.  Use over-the-counter medicines as needed as directed on packaging for symptom relief.  You may also use ibuprofen or tylenol as directed on packaging for pain or fever.  Do not take multiple medicines containing Tylenol or acetaminophen to avoid taking too much of this medication.  Follow-up instructions: Please follow-up with your primary care provider in the next 3 days for further evaluation of your symptoms if you are not feeling better.   Return instructions:   Please return to the Emergency Department if you experience worsening symptoms.   RETURN IMMEDIATELY IF you develop shortness of breath, confusion or altered mental status, a new rash, become dizzy, faint, or poorly responsive, or are unable to be cared for at home.  Please return if you have persistent vomiting  and cannot keep down fluids or develop a fever that is not controlled by tylenol or motrin.    Please return if you have any other emergent concerns.  Additional Information:  Your vital signs today were: BP 152/65 (BP Location: Right Arm)    Pulse 108    Temp 98.5 F (36.9 C) (Oral)    Resp 16    Ht 5\' 5"  (1.651 m)    Wt 117.9 kg    LMP 12/12/2016    SpO2 97%    BMI 43.27 kg/m  If your blood pressure (BP) was elevated above 135/85 this visit, please have this repeated by your doctor within one month. --------------

## 2016-12-31 ENCOUNTER — Encounter: Payer: Self-pay | Admitting: Physician Assistant

## 2016-12-31 ENCOUNTER — Telehealth: Payer: Self-pay | Admitting: Internal Medicine

## 2016-12-31 ENCOUNTER — Ambulatory Visit: Payer: Self-pay | Attending: Physician Assistant | Admitting: Physician Assistant

## 2016-12-31 ENCOUNTER — Ambulatory Visit: Payer: Self-pay | Attending: Internal Medicine

## 2016-12-31 VITALS — BP 152/95 | HR 93 | Temp 97.9°F | Resp 20 | Wt 283.2 lb

## 2016-12-31 DIAGNOSIS — L409 Psoriasis, unspecified: Secondary | ICD-10-CM | POA: Insufficient documentation

## 2016-12-31 DIAGNOSIS — E669 Obesity, unspecified: Secondary | ICD-10-CM | POA: Insufficient documentation

## 2016-12-31 DIAGNOSIS — R05 Cough: Secondary | ICD-10-CM

## 2016-12-31 DIAGNOSIS — K219 Gastro-esophageal reflux disease without esophagitis: Secondary | ICD-10-CM | POA: Insufficient documentation

## 2016-12-31 DIAGNOSIS — F329 Major depressive disorder, single episode, unspecified: Secondary | ICD-10-CM | POA: Insufficient documentation

## 2016-12-31 DIAGNOSIS — R059 Cough, unspecified: Secondary | ICD-10-CM

## 2016-12-31 DIAGNOSIS — F419 Anxiety disorder, unspecified: Secondary | ICD-10-CM | POA: Insufficient documentation

## 2016-12-31 DIAGNOSIS — Z6841 Body Mass Index (BMI) 40.0 and over, adult: Secondary | ICD-10-CM | POA: Insufficient documentation

## 2016-12-31 DIAGNOSIS — E785 Hyperlipidemia, unspecified: Secondary | ICD-10-CM | POA: Insufficient documentation

## 2016-12-31 DIAGNOSIS — J209 Acute bronchitis, unspecified: Secondary | ICD-10-CM | POA: Insufficient documentation

## 2016-12-31 DIAGNOSIS — Z85820 Personal history of malignant melanoma of skin: Secondary | ICD-10-CM | POA: Insufficient documentation

## 2016-12-31 DIAGNOSIS — Z79899 Other long term (current) drug therapy: Secondary | ICD-10-CM | POA: Insufficient documentation

## 2016-12-31 DIAGNOSIS — I10 Essential (primary) hypertension: Secondary | ICD-10-CM | POA: Insufficient documentation

## 2016-12-31 MED ORDER — AZITHROMYCIN 250 MG PO TABS: ORAL_TABLET | ORAL | 0 refills | Status: DC

## 2016-12-31 MED ORDER — PREDNISONE 20 MG PO TABS: 20.0000 mg | ORAL_TABLET | Freq: Every day | ORAL | 0 refills | Status: AC

## 2016-12-31 MED ORDER — ACETAMINOPHEN-CODEINE #3 300-30 MG PO TABS: 1.0000 | ORAL_TABLET | ORAL | 0 refills | Status: AC | PRN

## 2016-12-31 MED FILL — ACETAMINOPHEN/COD #3 TABLET: 300-30 | 3 days supply | Qty: 18 | Fill #0

## 2016-12-31 MED FILL — AZITHROMYCIN 250 MG TABLET: 250 | 5 days supply | Qty: 6 | Fill #0

## 2016-12-31 MED FILL — predniSONE 20 MG TABS: 20 | 5 days supply | Qty: 5 | Fill #0

## 2016-12-31 NOTE — Progress Notes (Signed)
Has blood tinged phlegm. continuous cough. Denies fever.  Taking Advil.

## 2016-12-31 NOTE — Patient Instructions (Signed)

## 2016-12-31 NOTE — Telephone Encounter (Signed)
Apt. Made for today.

## 2016-12-31 NOTE — Progress Notes (Signed)
Patient ID: DIKSHA FEO, female   DOB: 15-Dec-1970, 46 y.o.   MRN: [id number]    Subjective:  Patient ID: Janet [name], female    DOB: [birth year]  Age: 46 y.o. MRN: [id number]  CC: cough  HPI Janet [name] is a 46 y.o. white female with a PMH of   Past Medical History:  Diagnosis Date  . ABFND PAP SMEAR HGSIL 08/22/2007  . Anemia due to chronic blood loss - reports history of evaluation and told due to menorrhagia 05/30/2014  . ANXIETY 08/22/2007  . Anxiety and depression   . Frequent headaches   . GERD 08/22/2007  . Hyperglycemia   . Hyperlipemia   . HYPERTENSION, BENIGN ESSENTIAL 08/22/2007  . 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 Obesity 02/03/2015  . PLANTAR FASCIITIS, RIGHT 07/01/2008  . PSORIASIS 08/22/2007    that presents with a 45 day history of cough. Associated nasal congestion, blood tinged mucus, and sore throat due to cough. Went to the ED 4 days ago and was diagnosed with URI with cough and discharged with albuterol and tessalon. CXR at ED was negative. Has failed tessalon, mucinex, and ibuprofen. She continues with cough and is now becoming very hoarse. 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 Denies chest pain, SOB, headache, dizziness, fever, chills, nausea, vomiting, abdominal pain, rash, or GI/GU sxs.  Outpatient Medications Prior to Visit  Medication Sig Dispense Refill  . albuterol (PROVENTIL) (2.5 MG/3ML) 0.083% nebulizer solution Take 6 mLs (5 mg total) by nebulization every 4 (four) hours as needed for wheezing. 75 mL 0  . Clotrimazole 1 % OINT Apply 1 application topically 2 (two) times daily. 56.7 g 2  . famotidine (PEPCID) 20 MG tablet Take 1 tablet (20 mg total) by mouth 2 (two) times daily. 60 tablet 3  . hydrochlorothiazide (HYDRODIURIL) 25 MG tablet Take 1 tablet (25 mg total) by mouth daily. 90 tablet 3  . pravastatin (PRAVACHOL) 40 MG tablet Take 1 tablet (40 mg total) by mouth daily. 90 tablet 3  . benzonatate (TESSALON) 100 MG capsule Take 1 capsule  (100 mg total) by mouth every 8 (eight) hours. 15 capsule 0  . triamcinolone ointment (KENALOG) 0.5 % Apply 1 application topically 2 (two) times daily. 30 g 0   No facility-administered medications prior to visit.     ROS Review of Systems  Constitutional: Negative for fatigue and fever.  HENT: Positive for postnasal drip, rhinorrhea and sore throat (due to cough). Negative for ear pain, sinus pain, sinus pressure and trouble swallowing.   Eyes: Negative for pain.  Respiratory: Positive for cough and shortness of breath (when coughing). Negative for chest tightness and wheezing.   Cardiovascular: Negative for chest pain.  Gastrointestinal: Negative for abdominal pain, nausea and vomiting.  Musculoskeletal: Negative for myalgias and neck stiffness.  Skin: Negative for rash.  Neurological: Negative for dizziness and headaches.  Hematological: Negative for adenopathy.    Objective:  BP (!) 152/95 (BP Location: Left Arm, Patient Position: Sitting, Cuff Size: Large)   Pulse 93   Temp 97.9 F (36.6 C) (Oral)   Resp 20   Wt 283 lb 3.2 oz (128.5 kg)   LMP 12/12/2016   SpO2 98%   BMI 47.13 kg/m   BP/Weight 12/31/2016 [id number]  Systolic BP 0000000 0000000 [id number]  Diastolic BP 95 65 99  Wt. (Lbs) 283.2 260 280.6  BMI 47.13 43.27 46.69      Physical Exam  Constitutional: She is oriented to person, place, and  time.  NAD, occasional cough, obese, polite  HENT:  Head: Normocephalic and atraumatic.  Eyes: Conjunctivae are normal. No scleral icterus.  Neck: Normal range of motion. Neck supple. No thyromegaly present.  Cardiovascular: Normal rate, regular rhythm and normal heart sounds.   Pulmonary/Chest: Effort normal. No respiratory distress. She has no wheezes. She has no rales.  Mildly reduced breath sounds bilaterally  Abdominal: Soft.  Lymphadenopathy:    She has no cervical adenopathy.  Neurological: She is alert and oriented to person, place, and time.  Skin: Skin is warm  and dry. No rash noted. She is not diaphoretic. No pallor.  Psychiatric: She has a normal mood and affect. Her behavior is normal.     Assessment & Plan:   1. Acute bronchitis, unspecified organism - predniSONE (DELTASONE) 20 MG tablet; Take 1 tablet (20 mg total) by mouth daily with breakfast.  Dispense: 5 tablet; Refill: 0 - acetaminophen-codeine (TYLENOL #3) 300-30 MG tablet; Take 1 tablet by mouth every 4 (four) hours as needed for moderate pain.  Dispense: 18 tablet; Refill: 0  2. Cough - azithromycin (ZITHROMAX) 250 MG tablet; Take two tablets together on day one then one tablet daily thereafter until completion  Dispense: 6 each; Refill: 0   Meds ordered this encounter  Medications  . predniSONE (DELTASONE) 20 MG tablet    Sig: Take 1 tablet (20 mg total) by mouth daily with breakfast.    Dispense:  5 tablet    Refill:  0    Order Specific Question:   Supervising Provider    Answer:   Tresa Garter [id number]  . azithromycin (ZITHROMAX) 250 MG tablet    Sig: Take two tablets together on day one then one tablet daily thereafter until completion    Dispense:  6 each    Refill:  0    Order Specific Question:   Supervising Provider    Answer:   Tresa Garter [id number]  . acetaminophen-codeine (TYLENOL #3) 300-30 MG tablet    Sig: Take 1 tablet by mouth every 4 (four) hours as needed for moderate pain.    Dispense:  18 tablet    Refill:  0    Order Specific Question:   Supervising Provider    Answer:   Tresa Garter [id number]    Follow-up: Return in about 3 months (around 03/31/2017) for HTN follow up with PCP.   Clent Demark PA

## 2016-12-31 NOTE — Telephone Encounter (Signed)
Pt calling stating she is not feeling well. She has been coughing and having flu like symptom since last Friday. Pt went to the ED Monday and was told she had the viral flu strand but was not given any medication. Pt states yesterday she started cough up blood and today her chest feels heavy and like she has been hit by a bus  Please f/u at her work number first, [phone] and ask to speak to pt. If pt does not answer she says you can then try her cell, [phone]

## 2017-01-14 MED FILL — HYDROCHLOROTHIAZIDE 25 MG T: 25 | 30 days supply | Qty: 30 | Fill #1

## 2017-01-14 MED FILL — PRAVASTATIN NA 40 MG TAB: 40 | 30 days supply | Qty: 30 | Fill #1

## 2017-01-21 ENCOUNTER — Other Ambulatory Visit: Payer: Self-pay | Admitting: Obstetrics and Gynecology

## 2017-01-21 DIAGNOSIS — Z1231 Encounter for screening mammogram for malignant neoplasm of breast: Secondary | ICD-10-CM

## 2017-02-07 MED FILL — FAMOTIDINE 20 MG TABLET: 20 | 30 days supply | Qty: 60 | Fill #1

## 2017-02-10 ENCOUNTER — Ambulatory Visit (HOSPITAL_COMMUNITY)
Admission: RE | Admit: 2017-02-10 | Discharge: 2017-02-10 | Disposition: A | Discharge status: Home / Self Care | Payer: Self-pay | Source: Ambulatory Visit | Attending: Obstetrics and Gynecology | Admitting: Obstetrics and Gynecology

## 2017-02-10 ENCOUNTER — Ambulatory Visit
Admission: RE | Admit: 2017-02-10 | Discharge: 2017-02-10 | Disposition: A | Discharge status: Home / Self Care | Payer: No Typology Code available for payment source | Source: Ambulatory Visit | Attending: Obstetrics and Gynecology | Admitting: Obstetrics and Gynecology

## 2017-02-10 ENCOUNTER — Encounter (HOSPITAL_COMMUNITY): Payer: Self-pay

## 2017-02-10 VITALS — BP 112/70 | Temp 97.9°F | Ht 67.0 in | Wt 282.0 lb

## 2017-02-10 DIAGNOSIS — Z1239 Encounter for other screening for malignant neoplasm of breast: Secondary | ICD-10-CM

## 2017-02-10 DIAGNOSIS — Z1231 Encounter for screening mammogram for malignant neoplasm of breast: Secondary | ICD-10-CM

## 2017-02-10 NOTE — Patient Instructions (Signed)
Explained breast self awareness with Janet [name]. Patient did not need a Pap smear today due to last Pap smear and HPV typing was 07/29/2014. Let her know that due to her history of an abnormal Pap smear that her next Pap is due in August of 2018. Told patient she can call Janet [name] to schedule an appointment with [facility]. Referred patient to the [facility] for a screening mammogram. Appointment scheduled for Thursday, February 10, 2017 at 1600. Let patient know the [facility] will follow up with her within the next couple weeks with results of mammogram by letter or phone. Janet [name] verbalized understanding.  Janet [name], Arvil Chaco, RN 4:04 PM

## 2017-02-10 NOTE — Addendum Note (Signed)
Encounter addended by: Loletta Parish, RN on: 02/10/2017  4:13 PM<BR>    Actions taken: Sign clinical note

## 2017-02-10 NOTE — Progress Notes (Addendum)
Complaints of AUB. Will refer patient to the [facility] for her AUB.  Pap Smear: Pap smear not completed today. Last Pap smear was 07/29/2014 and normal with negative HPV. 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 Patient has had three normal Pap smears completed since LEEP that includes her most recent Pap smear, 04/12/2011, and 01/21/2010. Last three Pap smears, Colposcopy, and LEEP results are in EPIC.  Physical exam: Breasts Breasts symmetrical. No skin abnormalities bilateral breasts. No nipple retraction bilateral breasts. No nipple discharge bilateral breasts. No lymphadenopathy. No lumps palpated bilateral breasts. No complaints of pain or tenderness on exam. Referred patient to the [facility] for a screening mammogram. Appointment scheduled for Thursday, February 10, 2017 at 1600.        Pelvic/Bimanual No Pap smear completed today since last Pap smear and HPV typing was 07/29/2014. Pap smear not indicated per [facility] guidelines.   Smoking History: Patient has never smoked.  Patient Navigation: Patient education provided. Access to services provided for patient through [facility] program.

## 2017-02-11 ENCOUNTER — Encounter (HOSPITAL_COMMUNITY): Payer: Self-pay | Admitting: *Deleted

## 2017-02-14 ENCOUNTER — Other Ambulatory Visit: Payer: Self-pay | Admitting: Obstetrics and Gynecology

## 2017-02-14 DIAGNOSIS — R928 Other abnormal and inconclusive findings on diagnostic imaging of breast: Secondary | ICD-10-CM

## 2017-02-14 MED FILL — HYDROCHLOROTHIAZIDE 25 MG T: 25 | 30 days supply | Qty: 30 | Fill #2

## 2017-02-14 MED FILL — PRAVASTATIN NA 40 MG TAB: 40 | 30 days supply | Qty: 30 | Fill #2

## 2017-02-22 ENCOUNTER — Ambulatory Visit
Admission: RE | Admit: 2017-02-22 | Discharge: 2017-02-22 | Disposition: A | Discharge status: Home / Self Care | Payer: No Typology Code available for payment source | Source: Ambulatory Visit | Attending: Obstetrics and Gynecology | Admitting: Obstetrics and Gynecology

## 2017-02-22 DIAGNOSIS — R928 Other abnormal and inconclusive findings on diagnostic imaging of breast: Secondary | ICD-10-CM

## 2017-03-07 ENCOUNTER — Encounter: Payer: No Typology Code available for payment source | Admitting: Family Medicine

## 2017-03-11 ENCOUNTER — Ambulatory Visit: Payer: Self-pay | Attending: Internal Medicine

## 2017-03-16 ENCOUNTER — Ambulatory Visit: Payer: Self-pay | Attending: Internal Medicine | Admitting: Internal Medicine

## 2017-03-16 ENCOUNTER — Encounter: Payer: Self-pay | Admitting: Internal Medicine

## 2017-03-16 VITALS — BP 144/87 | HR 81 | Temp 98.7°F | Resp 16 | Wt 281.2 lb

## 2017-03-16 DIAGNOSIS — Z6841 Body Mass Index (BMI) 40.0 and over, adult: Secondary | ICD-10-CM | POA: Insufficient documentation

## 2017-03-16 DIAGNOSIS — E669 Obesity, unspecified: Secondary | ICD-10-CM | POA: Insufficient documentation

## 2017-03-16 DIAGNOSIS — I10 Essential (primary) hypertension: Secondary | ICD-10-CM | POA: Insufficient documentation

## 2017-03-16 DIAGNOSIS — F419 Anxiety disorder, unspecified: Secondary | ICD-10-CM | POA: Insufficient documentation

## 2017-03-16 DIAGNOSIS — E785 Hyperlipidemia, unspecified: Secondary | ICD-10-CM | POA: Insufficient documentation

## 2017-03-16 DIAGNOSIS — Z85820 Personal history of malignant melanoma of skin: Secondary | ICD-10-CM | POA: Insufficient documentation

## 2017-03-16 DIAGNOSIS — L409 Psoriasis, unspecified: Secondary | ICD-10-CM | POA: Insufficient documentation

## 2017-03-16 DIAGNOSIS — R400 Somnolence: Secondary | ICD-10-CM | POA: Insufficient documentation

## 2017-03-16 DIAGNOSIS — Z888 Allergy status to other drugs, medicaments and biological substances status: Secondary | ICD-10-CM | POA: Insufficient documentation

## 2017-03-16 DIAGNOSIS — R5383 Other fatigue: Secondary | ICD-10-CM | POA: Insufficient documentation

## 2017-03-16 DIAGNOSIS — F418 Other specified anxiety disorders: Secondary | ICD-10-CM

## 2017-03-16 DIAGNOSIS — K219 Gastro-esophageal reflux disease without esophagitis: Secondary | ICD-10-CM | POA: Insufficient documentation

## 2017-03-16 DIAGNOSIS — Z880 Allergy status to penicillin: Secondary | ICD-10-CM | POA: Insufficient documentation

## 2017-03-16 DIAGNOSIS — F329 Major depressive disorder, single episode, unspecified: Secondary | ICD-10-CM | POA: Insufficient documentation

## 2017-03-16 MED ORDER — ESCITALOPRAM OXALATE 20 MG PO TABS: 20.0000 mg | ORAL_TABLET | Freq: Every day | ORAL | 1 refills | Status: DC

## 2017-03-16 MED ORDER — PRAVASTATIN SODIUM 40 MG PO TABS: 40.0000 mg | ORAL_TABLET | Freq: Every day | ORAL | 3 refills | Status: DC

## 2017-03-16 MED ORDER — LISINOPRIL-HYDROCHLOROTHIAZIDE 10-12.5 MG PO TABS: 1.0000 | ORAL_TABLET | Freq: Every day | ORAL | 3 refills | Status: DC

## 2017-03-16 MED FILL — PRAVASTATIN NA 40 MG TAB: 40 | 30 days supply | Qty: 30 | Fill #0

## 2017-03-16 MED FILL — LISINOPRIL-HCTZ 10-12.5 MG: 10-12.5 | 30 days supply | Qty: 30 | Fill #0

## 2017-03-16 MED FILL — ?ESCITALOPRAM 20 MG TABLET: 20 | 30 days supply | Qty: 30 | Fill #0

## 2017-03-16 NOTE — Patient Instructions (Signed)
Major Depressive Disorder, Adult Major depressive disorder (MDD) is a mental health condition. MDD often makes you feel sad, hopeless, or helpless. MDD can also cause symptoms in your body. MDD can affect your:  Work.  School.  Relationships.  Other normal activities. MDD can range from mild to very bad. It may occur once (single episode MDD). It can also occur many times (recurrent MDD). The main symptoms of MDD often include:  Feeling sad, depressed, or irritable most of the time.  Loss of interest. MDD symptoms also include:  Sleeping too much or too little.  Eating too much or too little.  A change in your weight.  Feeling tired (fatigue) or having low energy.  Feeling worthless.  Feeling guilty.  Trouble making decisions.  Trouble thinking clearly.  Thoughts of suicide or harming others.  Feeling weak.  Feeling agitated.  Keeping yourself from being around other people (isolation). Follow these instructions at home: Activity   Do these things as told by your doctor:  Go back to your normal activities.  Exercise regularly.  Spend time outdoors. Alcohol   Talk with your doctor about how alcohol can affect your antidepressant medicines.  Do not drink alcohol. Or, limit how much alcohol you drink.  This means no more than 1 drink a day for nonpregnant women and 2 drinks a day for men. One drink equals one of these:  12 oz of beer.  5 oz of wine.  1 oz of hard liquor. General instructions   Take over-the-counter and prescription medicines only as told by your doctor.  Eat a healthy diet.  Get plenty of sleep.  Find activities that you enjoy. Make time to do them.  Think about joining a support group. Your doctor may be able to suggest a group for you.  Keep all follow-up visits as told by your doctor. This is important. Where to find more information:   [employer] on Mental Illness:  www.nami.org  [place]. National Institute of  Mental Health:  https://Faraone.com/  National Suicide Prevention Lifeline:  479-272-6676. This is free, 24-hour help. Contact a doctor if:  Your symptoms get worse.  You have new symptoms. Get help right away if:  You self-harm.  You see, hear, taste, smell, or feel things that are not present (hallucinate). If you ever feel like you may hurt yourself or others, or have thoughts about taking your own life, get help right away. You can go to your nearest emergency department or call:  Your local emergency services (911 in the [place]  A suicide crisis helpline, such as the [place]:  509-780-9142. This is open 24 hours a day. This information is not intended to replace advice given to you by your health care provider. Make sure you discuss any questions you have with your health care provider. Document Released: 11/03/2015 Document Revised: 08/08/2016 Document Reviewed: 08/08/2016 Elsevier Interactive Patient Education  2017 Elsevier Inc.  -  Fatigue Fatigue is feeling tired all of the time, a lack of energy, or a lack of motivation. Occasional or mild fatigue is often a normal response to activity or life in general. However, long-lasting (chronic) or extreme fatigue may indicate an underlying medical condition. Follow these instructions at home: Watch your fatigue for any changes. The following actions may help to lessen any discomfort you are feeling:  Talk to your health care provider about how much sleep you need each night. Try to get the required amount every night.  Take medicines  only as directed by your health care provider.  Eat a healthy and nutritious diet. Ask your health care provider if you need help changing your diet.  Drink enough fluid to keep your urine clear or pale yellow.  Practice ways of relaxing, such as yoga, meditation, massage therapy, or acupuncture.  Exercise regularly.  Change situations that cause you stress.  Try to keep your work and personal routine reasonable.  Do not abuse illegal drugs.  Limit alcohol intake to no more than 1 drink per day for nonpregnant women and 2 drinks per day for men. One drink equals 12 ounces of beer, 5 ounces of wine, or 1 ounces of hard liquor.  Take a multivitamin, if directed by your health care provider. Contact a health care provider if:  Your fatigue does not get better.  You have a fever.  You have unintentional weight loss or gain.  You have headaches.  You have difficulty:  Falling asleep.  Sleeping throughout the night.  You feel angry, guilty, anxious, or sad.  You are unable to have a bowel movement (constipation).  You skin is dry.  Your legs or another part of your body is swollen. Get help right away if:  You feel confused.  Your vision is blurry.  You feel faint or pass out.  You have a severe headache.  You have severe abdominal, pelvic, or back pain.  You have chest pain, shortness of breath, or an irregular or fast heartbeat.  You are unable to urinate or you urinate less than normal.  You develop abnormal bleeding, such as bleeding from the rectum, vagina, nose, lungs, or nipples.  You vomit blood.  You have thoughts about harming yourself or committing suicide.  You are worried that you might harm someone else. This information is not intended to replace advice given to you by your health care provider. Make sure you discuss any questions you have with your health care provider. Document Released: 09/19/2007 Document Revised: 04/29/2016 Document Reviewed: 03/26/2014 Elsevier Interactive Patient Education  2017 [employer].  -

## 2017-03-16 NOTE — Progress Notes (Signed)
Janet [name], is a 46 y.o. female  PRF:[id number]  [id number]  DOB - Dec 06, 1971  Chief Complaint  Patient presents with  . Hypertension  . Fatigue        Subjective:   Janet [name] is a 46 y.o. female here today for a follow up visit, for htn and obesity, last seen 12/14/16. Pt notes chronic fatigue/daytime and nighttime x 1 year now. Also c/o of anxiety/depression as well, stressors in family, not taking any rx and interested in starting something. Denies si/hi/avh. +snoring and daytime somnolence.  Denies tob/etoh.   Patient has No headache, No chest pain, No abdominal pain - No Nausea, No new weakness tingling or numbness, No Cough - SOB./doe  No problems updated.  ALLERGIES: Allergies  Allergen Reactions  . Lidocaine     ? Lidocaine allergy? Reports heart racing when has "shot of medicine in cevix" for leep  . Penicillins Itching    PAST MEDICAL HISTORY: Past Medical History:  Diagnosis Date  . ABFND PAP SMEAR HGSIL 08/22/2007  . Anemia due to chronic blood loss - reports history of evaluation and told due to menorrhagia 05/30/2014  . ANXIETY 08/22/2007  . Anxiety and depression   . Frequent headaches   . GERD 08/22/2007  . Hyperglycemia   . Hyperlipemia   . HYPERTENSION, BENIGN ESSENTIAL 08/22/2007  . 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 Obesity 02/03/2015  . PLANTAR FASCIITIS, RIGHT 07/01/2008  . PSORIASIS 08/22/2007    MEDICATIONS AT HOME: Prior to Admission medications   Medication Sig Start Date End Date Taking? Authorizing Provider  albuterol (PROVENTIL) (2.5 MG/3ML) 0.083% nebulizer solution Take 6 mLs (5 mg total) by nebulization every 4 (four) hours as needed for wheezing. Patient not taking: Reported on 02/10/2017 12/27/16   Carlisle Cater, PA-C  azithromycin [facility]) 250 MG tablet Take two tablets together on day one then one tablet daily thereafter until completion Patient not taking: Reported on 02/10/2017 12/31/16   Clent Demark, PA-C  Clotrimazole 1 %  OINT Apply 1 application topically 2 (two) times daily. Patient not taking: Reported on 02/10/2017 12/14/16   Maren Reamer, MD  escitalopram (LEXAPRO) 20 MG tablet Take 1 tablet (20 mg total) by mouth daily. Start 1/2 tab daily x 4 day, than full tab daily. 03/16/17   Maren Reamer, MD  famotidine (PEPCID) 20 MG tablet Take 1 tablet (20 mg total) by mouth 2 (two) times daily. 12/14/16 12/14/17  Maren Reamer, MD  lisinopril-hydrochlorothiazide (PRINZIDE,ZESTORETIC) 10-12.5 MG tablet Take 1 tablet by mouth daily. 03/16/17   Maren Reamer, MD  pravastatin (PRAVACHOL) 40 MG tablet Take 1 tablet (40 mg total) by mouth daily. 03/16/17   Maren Reamer, MD  triamcinolone ointment (KENALOG) 0.5 % Apply 1 application topically 2 (two) times daily. Patient not taking: Reported on 02/10/2017 12/14/16   Maren Reamer, MD     Objective:   Vitals:   03/16/17 1034  BP: (!) 144/87  Pulse: 81  Resp: 16  Temp: 98.7 F (37.1 C)  TempSrc: Oral  SpO2: 97%  Weight: 281 lb 3.2 oz (127.6 kg)   bmi 44  Exam General appearance : Awake, alert, not in any distress. Speech Clear. Not toxic looking, morbid obese. HEENT: Atraumatic and Normocephalic, pupils equally reactive to light. Neck: supple, no JVD.  Chest:Good air entry bilaterally, no added sounds. CVS: S1 S2 regular, no murmurs/gallups or rubs. Abdomen: Bowel sounds active, obese, limited exam due to body habitus. Non tender Extremities: B/L Lower  Ext shows no edema, both legs are warm to touch Neurology: Awake alert, and oriented X 3, CN II-XII grossly intact, Non focal Skin:No Rash  Data Review Lab Results  Component Value Date   HGBA1C 5.5 12/14/2016   HGBA1C 6.3 10/23/2014   HGBA1C 6.0 05/30/2014    Depression screen PHQ 2/9 03/16/2017 12/31/2016 12/14/2016  Decreased Interest 2 2 1   Down, Depressed, Hopeless 1 2 0  PHQ - 2 Score 3 4 1   Altered sleeping 1 2 -  Tired, decreased energy 3 2 -  Change in appetite 2 1 -  Feeling bad or  failure about yourself  1 0 -  Trouble concentrating 0 0 -  Moving slowly or fidgety/restless - 0 -  Suicidal thoughts 0 0 -  PHQ-9 Score 10 9 -      Assessment & Plan   1. Anxiety and depression May be causing fatigue, denies si/hi/avh - trial lexapro 20mg , take 1/2 tab x 4 days, than increase to full does. - fu in 1 month  2. HYPERTENSION, BENIGN ESSENTIAL - better, but remains uncontrolled, goal <130/80 - dc hctz - start prinzide 10-12.5 qd - CMP and Liver - CBC with Differential  3. Daytime somnolence, may be contributing to fatigue - Split night study; Future  4. Other fatigue - Vitamin D, 25-hydroxy  5. Obesity, unspecified classification, unspecified obesity type, unspecified whether serious comorbidity present May have osa., weight loss encouraged.     Patient have been counseled extensively about nutrition and exercise  Return in about 4 weeks (around 04/13/2017) for depression/fatigue /htn.  The patient was given clear instructions to go to ER or return to medical center if symptoms don't improve, worsen or new problems develop. The patient verbalized understanding. The patient was told to call to get lab results if they haven't heard anything in the next week.   This note has been created with Surveyor, quantity. Any transcriptional errors are unintentional.   Maren Reamer, MD, [facility] [place], [place]   03/16/2017, 11:46 AM

## 2017-03-17 LAB — CMP AND LIVER
ALBUMIN: 4.4 g/dL (ref 3.5–5.5)
ALK PHOS: 72 IU/L (ref 39–117)
ALT: 30 IU/L (ref 0–32)
AST: 25 IU/L (ref 0–40)
BUN: 13 mg/dL (ref 6–24)
Bilirubin Total: 0.2 mg/dL (ref 0.0–1.2)
Bilirubin, Direct: 0.08 mg/dL (ref 0.00–0.40)
CO2: 26 mmol/L (ref 18–29)
CREATININE: 0.7 mg/dL (ref 0.57–1.00)
Calcium: 9.8 mg/dL (ref 8.7–10.2)
Chloride: 101 mmol/L (ref 96–106)
GFR calc Af Amer: 122 mL/min/{1.73_m2} (ref >59)
GFR, EST NON AFRICAN AMERICAN: 106 mL/min/{1.73_m2} (ref >59)
Glucose: 91 mg/dL (ref 65–99)
POTASSIUM: 4.5 mmol/L (ref 3.5–5.2)
SODIUM: 143 mmol/L (ref 134–144)
TOTAL PROTEIN: 7.3 g/dL (ref 6.0–8.5)

## 2017-03-17 LAB — CBC WITH DIFFERENTIAL/PLATELET
BASOS: 0 %
Basophils Absolute: 0 10*3/uL (ref 0.0–0.2)
EOS (ABSOLUTE): 0.2 10*3/uL (ref 0.0–0.4)
EOS: 2 %
HEMATOCRIT: 38.8 % (ref 34.0–46.6)
HEMOGLOBIN: 13.2 g/dL (ref 11.1–15.9)
IMMATURE GRANS (ABS): 0 10*3/uL (ref 0.0–0.1)
IMMATURE GRANULOCYTES: 0 %
Lymphocytes Absolute: 2.8 10*3/uL (ref 0.7–3.1)
Lymphs: 31 %
MCH: 31.1 pg (ref 26.6–33.0)
MCHC: 34 g/dL (ref 31.5–35.7)
MCV: 92 fL (ref 79–97)
MONOCYTES: 11 %
MONOS ABS: 1 10*3/uL — AB (ref 0.1–0.9)
NEUTROS PCT: 56 %
Neutrophils Absolute: 5.2 10*3/uL (ref 1.4–7.0)
Platelets: 368 10*3/uL (ref 150–379)
RBC: 4.24 x10E6/uL (ref 3.77–5.28)
RDW: 13.1 % (ref 12.3–15.4)
WBC: 9.2 10*3/uL (ref 3.4–10.8)

## 2017-03-17 LAB — VITAMIN D 25 HYDROXY (VIT D DEFICIENCY, FRACTURES): Vit D, 25-Hydroxy: 23.4 ng/mL — ABNORMAL LOW (ref 30.0–100.0)

## 2017-04-05 ENCOUNTER — Telehealth: Payer: Self-pay | Admitting: Internal Medicine

## 2017-04-05 NOTE — Telephone Encounter (Signed)
PT called requesting speak with the nurse or the pcp since she was prescribe lisinopril-hydrochlorothiazide (PRINZIDE,ZESTORETIC) 10-12.5 MG tablet   and it making feel numbness and dizzy, she stop taking for the last two days and the symptoms stop, she want to know if you can call her and leave a msg if she don't answer of what she need to do or maybe you are going to prescribe something else

## 2017-04-06 NOTE — Telephone Encounter (Signed)
Unable to reach, left VM.  Janet [name] - could you call her to check on her.  Tell her to just resume her hctz 25 mg daily for now, she should still have some. Stop the prinzide, drink plenty of water, keep her salt intake 2000mg  /daily.  Please have her make f/u appt w/ you in abt 1 - 2 wks for bp check.  If sbp >130 on hctz 25, than add norvasc 2.5 mg qday.  I dc prinzide for now, may be too much for her. thanks

## 2017-04-06 NOTE — Telephone Encounter (Signed)
Pt aware of note, appointment scheduled 04/15/17 at 4:30. Pt verbalized understanding.

## 2017-04-06 NOTE — Telephone Encounter (Signed)
Will forward to pcp

## 2017-04-07 MED FILL — HYDROCHLOROTHIAZIDE 25 MG T: 25 | 30 days supply | Qty: 30 | Fill #3

## 2017-04-15 ENCOUNTER — Ambulatory Visit: Payer: Self-pay | Attending: Internal Medicine | Admitting: *Deleted

## 2017-04-15 VITALS — BP 126/90 | HR 98

## 2017-04-15 DIAGNOSIS — Z7689 Persons encountering health services in other specified circumstances: Secondary | ICD-10-CM | POA: Insufficient documentation

## 2017-04-15 DIAGNOSIS — I10 Essential (primary) hypertension: Secondary | ICD-10-CM

## 2017-04-15 NOTE — Progress Notes (Signed)
Pt arrived to [facility]. Pt alert and oriented and arrives in good spirits. Last OV 03/16/2017  with Dr. Janne Napoleon.   Pt denies chest pain, SOB, HA, dizziness, or blurred vision.  Verified medication. Pt states medication was taken this morning.  Manual blood pressure reading: 126/84 and 126/90.  Pt states she his taking medication as prescribed and does not add salt to food. Informed to minimize salt intake to 2000-mg/day. Pt verbalized understanding. Pt has appointment to f/u with Dr. Janne Napoleon on 04/28/17.

## 2017-04-21 ENCOUNTER — Encounter: Payer: Self-pay | Admitting: Internal Medicine

## 2017-04-25 ENCOUNTER — Encounter: Payer: Self-pay | Admitting: Internal Medicine

## 2017-04-25 ENCOUNTER — Ambulatory Visit: Payer: Self-pay | Attending: Internal Medicine | Admitting: Internal Medicine

## 2017-04-25 VITALS — BP 137/86 | HR 90 | Temp 98.2°F | Resp 16 | Wt 282.4 lb

## 2017-04-25 DIAGNOSIS — F329 Major depressive disorder, single episode, unspecified: Secondary | ICD-10-CM | POA: Insufficient documentation

## 2017-04-25 DIAGNOSIS — E669 Obesity, unspecified: Secondary | ICD-10-CM | POA: Insufficient documentation

## 2017-04-25 DIAGNOSIS — Z6841 Body Mass Index (BMI) 40.0 and over, adult: Secondary | ICD-10-CM | POA: Insufficient documentation

## 2017-04-25 DIAGNOSIS — R21 Rash and other nonspecific skin eruption: Secondary | ICD-10-CM | POA: Insufficient documentation

## 2017-04-25 DIAGNOSIS — Z79899 Other long term (current) drug therapy: Secondary | ICD-10-CM | POA: Insufficient documentation

## 2017-04-25 DIAGNOSIS — F32A Depression, unspecified: Secondary | ICD-10-CM

## 2017-04-25 DIAGNOSIS — E785 Hyperlipidemia, unspecified: Secondary | ICD-10-CM | POA: Insufficient documentation

## 2017-04-25 DIAGNOSIS — F419 Anxiety disorder, unspecified: Secondary | ICD-10-CM | POA: Insufficient documentation

## 2017-04-25 DIAGNOSIS — Z85820 Personal history of malignant melanoma of skin: Secondary | ICD-10-CM | POA: Insufficient documentation

## 2017-04-25 DIAGNOSIS — Z880 Allergy status to penicillin: Secondary | ICD-10-CM | POA: Insufficient documentation

## 2017-04-25 DIAGNOSIS — I10 Essential (primary) hypertension: Secondary | ICD-10-CM | POA: Insufficient documentation

## 2017-04-25 DIAGNOSIS — K219 Gastro-esophageal reflux disease without esophagitis: Secondary | ICD-10-CM | POA: Insufficient documentation

## 2017-04-25 MED ORDER — KETOCONAZOLE 2 % EX CREA: 1.0000 "application " | TOPICAL_CREAM | Freq: Every day | CUTANEOUS | 0 refills | Status: DC

## 2017-04-25 MED ORDER — ESCITALOPRAM OXALATE 20 MG PO TABS: 10.0000 mg | ORAL_TABLET | Freq: Every day | ORAL | 2 refills | Status: DC

## 2017-04-25 MED ORDER — FAMOTIDINE 20 MG PO TABS: 20.0000 mg | ORAL_TABLET | Freq: Two times a day (BID) | ORAL | 3 refills | Status: DC

## 2017-04-25 MED ORDER — KETOCONAZOLE 2 % EX GEL: 1.0000 "application " | Freq: Two times a day (BID) | CUTANEOUS | 0 refills | Status: DC

## 2017-04-25 MED FILL — ESCITALOPRAM 20 MG TABLET: 20 | 30 days supply | Qty: 15 | Fill #0

## 2017-04-25 MED FILL — KETOCONAZOLE 2% CREAM: 2 | 15 days supply | Qty: 15 | Fill #0

## 2017-04-25 MED FILL — FAMOTIDINE 20 MG TABLET: 20 | 30 days supply | Qty: 60 | Fill #0

## 2017-04-25 NOTE — Progress Notes (Signed)
Janet [name], is a 46 y.o. female  SHF:[id number]  FOY:[id number]  DOB - [birth year]  Chief Complaint  Patient presents with  . Follow-up        Subjective:   Janet [name] is a 46 y.o. female here today for a follow up visit for htn and depression, last seen 03/16/17. She is doing well on lexapro, takes 10mg  qd w/o issues.  Stopped taking her prinzide in past b/c of c/o of bilat legs cramping severely after 4-5 days of it. She has since stopped and sxs resolved. She is back on hctz 25 qd w/o issues. Drinks lots of water daily.  Her rash on right leg not getting much better, still slighltly pink/red and itchy.  Patient has No headache, No chest pain, No abdominal pain - No Nausea, No new weakness tingling or numbness, No Cough - SOB.  No problems updated.  ALLERGIES: Allergies  Allergen Reactions  . Lidocaine     ? Lidocaine allergy? Reports heart racing when has "shot of medicine in cevix" for leep  . Lisinopril-Hydrochlorothiazide     Leg cramps.  . Penicillins Itching    PAST MEDICAL HISTORY: Past Medical History:  Diagnosis Date  . ABFND PAP SMEAR HGSIL 08/22/2007  . Anemia due to chronic blood loss - reports history of evaluation and told due to menorrhagia 05/30/2014  . ANXIETY 08/22/2007  . Anxiety and depression   . Frequent headaches   . GERD 08/22/2007  . Hyperglycemia   . Hyperlipemia   . HYPERTENSION, BENIGN ESSENTIAL 08/22/2007  . 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 Obesity 02/03/2015  . PLANTAR FASCIITIS, RIGHT 07/01/2008  . PSORIASIS 08/22/2007    MEDICATIONS AT HOME: Prior to Admission medications   Medication Sig Start Date End Date Taking? Authorizing Provider  albuterol (PROVENTIL) (2.5 MG/3ML) 0.083% nebulizer solution Take 6 mLs (5 mg total) by nebulization every 4 (four) hours as needed for wheezing. Patient not taking: Reported on 02/10/2017 12/27/16   Carlisle Cater, PA-C  azithromycin [facility]) 250 MG tablet Take two tablets together on day one then  one tablet daily thereafter until completion Patient not taking: Reported on 02/10/2017 12/31/16   Clent Demark, PA-C  Clotrimazole 1 % OINT Apply 1 application topically 2 (two) times daily. 12/14/16   Milina Pagett, Leda Quail, MD  escitalopram (LEXAPRO) 20 MG tablet Take 0.5 tablets (10 mg total) by mouth daily. 04/25/17   Maren Reamer, MD  famotidine (PEPCID) 20 MG tablet Take 1 tablet (20 mg total) by mouth 2 (two) times daily. 04/25/17 04/25/18  Maren Reamer, MD  hydrochlorothiazide (HYDRODIURIL) 25 MG tablet Take 25 mg by mouth daily.    [provider]  Ketoconazole 2 % GEL Apply 1 application topically 2 (two) times daily. Right leg rash 04/25/17   Lottie Mussel T, MD  pravastatin (PRAVACHOL) 40 MG tablet Take 1 tablet (40 mg total) by mouth daily. 03/16/17   Maren Reamer, MD     Objective:   Vitals:   04/25/17 1624  BP: 137/86  Pulse: 90  Resp: 16  Temp: 98.2 F (36.8 C)  TempSrc: Oral  SpO2: 95%  Weight: 282 lb 6.4 oz (128.1 kg)    Exam General appearance : Awake, alert, not in any distress. Speech Clear. Not toxic looking, obese HEENT: Atraumatic and Normocephalic, pupils equally reactive to light. Neck: supple, no JVD.  Chest:Good air entry bilaterally, no added sounds. CVS: S1 S2 regular, no murmurs/gallups or rubs. Abdomen: Bowel sounds active, Non tender and  not distended with no gaurding, rigidity or rebound. Extremities: B/L Lower Ext shows no edema, both legs are warm to touch  Neurology: Awake alert, and oriented X 3, CN II-XII grossly intact, Non focal Skin: Right leg tibula region w/ mild erythema and depigmentation noted, no flaky skin noted, but more obvious appearance around her dark complexion from sun exposure.    Data Review Lab Results  Component Value Date   HGBA1C 5.5 12/14/2016   HGBA1C 6.3 10/23/2014   HGBA1C 6.0 05/30/2014    Depression screen [facility] 2/9 04/25/2017 03/16/2017 12/31/2016 12/14/2016  Decreased Interest 1 2 2 1     Down, Depressed, Hopeless 1 1 2  0  PHQ - 2 Score 2 3 4 1   Altered sleeping 2 1 2  -  Tired, decreased energy 2 3 2  -  Change in appetite 2 2 1  -  Feeling bad or failure about yourself  1 1 0 -  Trouble concentrating 1 0 0 -  Moving slowly or fidgety/restless 0 - 0 -  Suicidal thoughts 0 0 0 -  PHQ-9 Score 10 10 9  -      Assessment & Plan   1. htn - slightly above goal, did not tol prizinde in past (had severe cramps), but tol hctz 25 qd. - low salt diet encouraged, increase activity - reeavl in 3 months.  2. Anxiety/depression Doing well on lexapro 10mg  qd, continued No si/hi/avh  3. Rash, rle - failed kenaolog trial, asked pt to stop given concern that may be thinning her skin more - recd sunscreen daily. - trial ketoconazole cream for possible underlying fungal infection given c/o of pruritis as well - remains uninsured, so cannot send to derm  Patient have been counseled extensively about nutrition and exercise  Return in about 3 months (around 07/26/2017), or if symptoms worsen or fail to improve, for needs pap in 8/18.  The patient was given clear instructions to go to ER or return to medical center if symptoms don't improve, worsen or new problems develop. The patient verbalized understanding. The patient was told to call to get lab results if they haven't heard anything in the next week.   This note has been created with Surveyor, quantity. Any transcriptional errors are unintentional.   Maren Reamer, MD, [place] [facility] [place], [place]   04/25/2017, 4:41 PM

## 2017-04-25 NOTE — Patient Instructions (Addendum)
Vitamin D 5,000 IU daily.  -  Low-Sodium Eating Plan Sodium, which is an element that makes up salt, helps you maintain a healthy balance of fluids in your body. Too much sodium can increase your blood pressure and cause fluid and waste to be held in your body. Your health care provider or dietitian may recommend following this plan if you have high blood pressure (hypertension), kidney disease, liver disease, or heart failure. Eating less sodium can help lower your blood pressure, reduce swelling, and protect your heart, liver, and kidneys. What are tips for following this plan? General guidelines   Most people on this plan should limit their sodium intake to 1,500-2,000 mg (milligrams) of sodium each day. Reading food labels   The Nutrition Facts label lists the amount of sodium in one serving of the food. If you eat more than one serving, you must multiply the listed amount of sodium by the number of servings.  Choose foods with less than 140 mg of sodium per serving.  Avoid foods with 300 mg of sodium or more per serving. Shopping   Look for lower-sodium products, often labeled as "low-sodium" or "no salt added."  Always check the sodium content even if foods are labeled as "unsalted" or "no salt added".  Buy fresh foods.  Avoid canned foods and premade or frozen meals.  Avoid canned, cured, or processed meats  Buy breads that have less than 80 mg of sodium per slice. Cooking   Eat more home-cooked food and less restaurant, buffet, and fast food.  Avoid adding salt when cooking. Use salt-free seasonings or herbs instead of table salt or sea salt. Check with your health care provider or pharmacist before using salt substitutes.  Cook with plant-based oils, such as canola, sunflower, or olive oil. Meal planning   When eating at a restaurant, ask that your food be prepared with less salt or no salt, if possible.  Avoid foods that contain MSG (monosodium glutamate). MSG is  sometimes added to [place] food, bouillon, and some canned foods. What foods are recommended? The items listed may not be a complete list. Talk with your dietitian about what dietary choices are best for you. Grains  Low-sodium cereals, including oats, puffed wheat and rice, and shredded wheat. Low-sodium crackers. Unsalted rice. Unsalted pasta. Low-sodium bread. Whole-grain breads and whole-grain pasta. Vegetables  Fresh or frozen vegetables. "No salt added" canned vegetables. "No salt added" tomato sauce and paste. Low-sodium or reduced-sodium tomato and vegetable juice. Fruits  Fresh, frozen, or canned fruit. Fruit juice. Meats and other protein foods  Fresh or frozen (no salt added) meat, poultry, seafood, and fish. Low-sodium canned tuna and salmon. Unsalted nuts. Dried peas, beans, and lentils without added salt. Unsalted canned beans. Eggs. Unsalted nut butters. Dairy  Milk. Soy milk. Cheese that is naturally low in sodium, such as ricotta cheese, fresh mozzarella, or Swiss cheese Low-sodium or reduced-sodium cheese. Cream cheese. Yogurt. Fats and oils  Unsalted butter. Unsalted margarine with no trans fat. Vegetable oils such as canola or olive oils. Seasonings and other foods  Fresh and dried herbs and spices. Salt-free seasonings. Low-sodium mustard and ketchup. Sodium-free salad dressing. Sodium-free light mayonnaise. Fresh or refrigerated horseradish. Lemon juice. Vinegar. Homemade, reduced-sodium, or low-sodium soups. Unsalted popcorn and pretzels. Low-salt or salt-free chips. What foods are not recommended? The items listed may not be a complete list. Talk with your dietitian about what dietary choices are best for you. Grains  Instant hot cereals. Bread stuffing, pancake,  and biscuit mixes. Croutons. Seasoned rice or pasta mixes. Noodle soup cups. Boxed or frozen macaroni and cheese. Regular salted crackers. Self-rising flour. Vegetables  Sauerkraut, pickled vegetables, and  relishes. Olives. [place] fries. Onion rings. Regular canned vegetables (not low-sodium or reduced-sodium). Regular canned tomato sauce and paste (not low-sodium or reduced-sodium). Regular tomato and vegetable juice (not low-sodium or reduced-sodium). Frozen vegetables in sauces. Meats and other protein foods  Meat or fish that is salted, canned, smoked, spiced, or pickled. Bacon, ham, sausage, hotdogs, corned beef, chipped beef, packaged lunch meats, salt pork, jerky, pickled herring, anchovies, regular canned tuna, sardines, salted nuts. Dairy  Processed cheese and cheese spreads. Cheese curds. Blue cheese. Feta cheese. String cheese. Regular cottage cheese. Buttermilk. Canned milk. Fats and oils  Salted butter. Regular margarine. Ghee. Bacon fat. Seasonings and other foods  Onion salt, garlic salt, seasoned salt, table salt, and sea salt. Canned and packaged gravies. Worcestershire sauce. Tartar sauce. Barbecue sauce. Teriyaki sauce. Soy sauce, including reduced-sodium. Steak sauce. Fish sauce. Oyster sauce. Cocktail sauce. Horseradish that you find on the shelf. Regular ketchup and mustard. Meat flavorings and tenderizers. Bouillon cubes. Hot sauce and Tabasco sauce. Premade or packaged marinades. Premade or packaged taco seasonings. Relishes. Regular salad dressings. Salsa. Potato and tortilla chips. Corn chips and puffs. Salted popcorn and pretzels. Canned or dried soups. Pizza. Frozen entrees and pot pies. Summary  Eating less sodium can help lower your blood pressure, reduce swelling, and protect your heart, liver, and kidneys.  Most people on this plan should limit their sodium intake to 1,500-2,000 mg (milligrams) of sodium each day.  Canned, boxed, and frozen foods are high in sodium. Restaurant foods, fast foods, and pizza are also very high in sodium. You also get sodium by adding salt to food.  Try to cook at home, eat more fresh fruits and vegetables, and eat less fast food, canned,  processed, or prepared foods. This information is not intended to replace advice given to you by your health care provider. Make sure you discuss any questions you have with your health care provider. Document Released: 05/14/2002 Document Revised: 11/15/2016 Document Reviewed: 11/15/2016 Elsevier Interactive Patient Education  2017 [place] to [employer] Exercising regularly is important. It has many health benefits, such as:  Improving your overall fitness, flexibility, and endurance.  Increasing your bone density.  Helping with weight control.  Decreasing your body fat.  Increasing your muscle strength.  Reducing stress and tension.  Improving your overall health. In order to become healthy and stay healthy, it is recommended that you do moderate-intensity and vigorous-intensity exercise. You can tell that you are exercising at a moderate intensity if you have a higher heart rate and faster breathing, but you are still able to hold a conversation. You can tell that you are exercising at a vigorous intensity if you are breathing much harder and faster and cannot hold a conversation while exercising. How often should I exercise? Choose an activity that you enjoy and set realistic goals. Your health care provider can help you to make an activity plan that works for you. Exercise regularly as directed by your health care provider. This may include:  Doing resistance training twice each week, such as:  Push-ups.  Sit-ups.  Lifting weights.  Using resistance bands.  Doing a given intensity of exercise for a given amount of time. Choose from these options:  150 minutes of moderate-intensity exercise every week.  75 minutes of vigorous-intensity exercise every week.  A mix of moderate-intensity and vigorous-intensity exercise every week. Children, pregnant women, people who are out of shape, people who are overweight, and older adults may need to consult a  health care provider for individual recommendations. If you have any sort of medical condition, be sure to consult your health care provider before starting a new exercise program. What are some exercise ideas? Some moderate-intensity exercise ideas include:  Walking at a rate of 1 mile in 15 minutes.  Biking.  Hiking.  Golfing.  Dancing. Some vigorous-intensity exercise ideas include:  Walking at a rate of at least 4.5 miles per hour.  Jogging or running at a rate of 5 miles per hour.  Biking at a rate of at least 10 miles per hour.  Lap swimming.  Roller-skating or in-line skating.  Cross-country skiing.  Vigorous competitive sports, such as football, basketball, and soccer.  Jumping rope.  Aerobic dancing. What are some everyday activities that can help me to get exercise?  Yard work, such as:  Psychologist, educational.  Raking and bagging leaves.  Washing and waxing your car.  Pushing a stroller.  Shoveling snow.  Gardening.  Washing windows or floors. How can I be more active in my day-to-day activities?  Use the stairs instead of the elevator.  Take a walk during your lunch break.  If you drive, park your car farther away from work or school.  If you take public transportation, get off one stop early and walk the rest of the way.  Make all of your phone calls while standing up and walking around.  Get up, stretch, and walk around every 30 minutes throughout the day. What guidelines should I follow while exercising?  Do not exercise so much that you hurt yourself, feel dizzy, or get very short of breath.  Consult your health care provider before starting a new exercise program.  Wear comfortable clothes and shoes with good support.  Drink plenty of water while you exercise to prevent dehydration or heat stroke. Body water is lost during exercise and must be replaced.  Work out until you breathe faster and your heart beats faster. This  information is not intended to replace advice given to you by your health care provider. Make sure you discuss any questions you have with your health care provider. Document Released: 12/25/2010 Document Revised: 04/29/2016 Document Reviewed: 04/25/2014 Elsevier Interactive Patient Education  2017 [place] for Gastroesophageal Reflux Disease, Adult When you have gastroesophageal reflux disease (GERD), the foods you eat and your eating habits are very important. Choosing the right foods can help ease your discomfort. What guidelines do I need to follow?  Choose fruits, vegetables, whole grains, and low-fat dairy products.  Choose low-fat meat, fish, and poultry.  Limit fats such as oils, salad dressings, butter, nuts, and avocado.  Keep a food diary. This helps you identify foods that cause symptoms.  Avoid foods that cause symptoms. These may be different for everyone.  Eat small meals often instead of 3 large meals a day.  Eat your meals slowly, in a place where you are relaxed.  Limit fried foods.  Cook foods using methods other than frying.  Avoid drinking alcohol.  Avoid drinking large amounts of liquids with your meals.  Avoid bending over or lying down until 2-3 hours after eating. What foods are not recommended? These are some foods and drinks that may make your symptoms worse: Vegetables  Tomatoes. Tomato juice. Tomato and spaghetti  sauce. Chili peppers. Onion and garlic. Horseradish. Fruits  Oranges, grapefruit, and lemon (fruit and juice). Meats  High-fat meats, fish, and poultry. This includes hot dogs, ribs, ham, sausage, salami, and bacon. Dairy  Whole milk and chocolate milk. Sour cream. Cream. Butter. Ice cream. Cream cheese. Drinks  Coffee and tea. Bubbly (carbonated) drinks or energy drinks. Condiments  Hot sauce. Barbecue sauce. Sweets/Desserts  Chocolate and cocoa. Donuts. Peppermint and spearmint. Fats and Oils  High-fat  foods. This includes [place] fries and potato chips. Other  Vinegar. Strong spices. This includes black pepper, white pepper, red pepper, cayenne, curry powder, cloves, ginger, and chili powder. The items listed above may not be a complete list of foods and drinks to avoid. Contact your dietitian for more information.  This information is not intended to replace advice given to you by your health care provider. Make sure you discuss any questions you have with your health care provider. Document Released: 05/23/2012 Document Revised: 04/29/2016 Document Reviewed: 09/26/2013 Elsevier Interactive Patient Education  2017 [employer].

## 2017-05-17 MED FILL — HYDROCHLOROTHIAZIDE 25 MG T: 25 | 30 days supply | Qty: 30 | Fill #4

## 2017-05-17 MED FILL — PRAVASTATIN NA 40 MG TAB: 40 | 30 days supply | Qty: 30 | Fill #1

## 2017-05-25 ENCOUNTER — Encounter (HOSPITAL_BASED_OUTPATIENT_CLINIC_OR_DEPARTMENT_OTHER): Payer: Self-pay

## 2017-07-14 ENCOUNTER — Encounter (HOSPITAL_BASED_OUTPATIENT_CLINIC_OR_DEPARTMENT_OTHER): Payer: Self-pay

## 2017-07-28 ENCOUNTER — Ambulatory Visit: Payer: Self-pay | Admitting: Internal Medicine

## 2017-08-02 MED FILL — PRAVASTATIN NA 40 MG TAB: 40 | 30 days supply | Qty: 30 | Fill #2

## 2017-08-02 MED FILL — HYDROCHLOROTHIAZIDE 25 MG T: 25 | 30 days supply | Qty: 30 | Fill #5

## 2017-08-21 ENCOUNTER — Encounter (HOSPITAL_COMMUNITY): Payer: Self-pay | Admitting: Emergency Medicine

## 2017-08-21 ENCOUNTER — Emergency Department (HOSPITAL_COMMUNITY)
Admission: EM | Admit: 2017-08-21 | Discharge: 2017-08-21 | Disposition: A | Discharge status: Home / Self Care | Payer: Self-pay | Attending: Emergency Medicine | Admitting: Emergency Medicine

## 2017-08-21 DIAGNOSIS — R319 Hematuria, unspecified: Secondary | ICD-10-CM | POA: Insufficient documentation

## 2017-08-21 DIAGNOSIS — I10 Essential (primary) hypertension: Secondary | ICD-10-CM | POA: Insufficient documentation

## 2017-08-21 DIAGNOSIS — Z85820 Personal history of malignant melanoma of skin: Secondary | ICD-10-CM | POA: Insufficient documentation

## 2017-08-21 DIAGNOSIS — Z7902 Long term (current) use of antithrombotics/antiplatelets: Secondary | ICD-10-CM | POA: Insufficient documentation

## 2017-08-21 DIAGNOSIS — Z79899 Other long term (current) drug therapy: Secondary | ICD-10-CM | POA: Insufficient documentation

## 2017-08-21 DIAGNOSIS — R809 Proteinuria, unspecified: Secondary | ICD-10-CM | POA: Insufficient documentation

## 2017-08-21 DIAGNOSIS — R109 Unspecified abdominal pain: Secondary | ICD-10-CM | POA: Insufficient documentation

## 2017-08-21 LAB — URINALYSIS, ROUTINE W REFLEX MICROSCOPIC
BACTERIA UA: NONE SEEN
BILIRUBIN URINE: NEGATIVE
Glucose, UA: NEGATIVE mg/dL
KETONES UR: NEGATIVE mg/dL
LEUKOCYTES UA: NEGATIVE
Nitrite: NEGATIVE
PH: 6 (ref 5.0–8.0)
PROTEIN: 100 mg/dL — AB
Specific Gravity, Urine: 1.026 (ref 1.005–1.030)

## 2017-08-21 LAB — PREGNANCY, URINE: Preg Test, Ur: NEGATIVE

## 2017-08-21 MED ORDER — CYCLOBENZAPRINE HCL 10 MG PO TABS: 10.0000 mg | ORAL_TABLET | Freq: Every evening | ORAL | 0 refills | Status: DC | PRN

## 2017-08-21 NOTE — ED Notes (Signed)
Pt reports bilateral flank pain X1 week, also reports chills, just "not feeling good" Pt believes she has a UTI.

## 2017-08-21 NOTE — Discharge Instructions (Signed)

## 2017-08-21 NOTE — ED Provider Notes (Signed)
[facility] DEPT Provider Note   CSN: [id number] Arrival date & time: 08/21/17  1803     History   Chief Complaint Chief Complaint  Patient presents with  . Flank Pain    HPI Janet [name] is a 46 y.o. female who presents for evaluation of bilateral flank pain for approximately 2 weeks. She reports that it is intermittent and comes and goes, and that in between it will fully go away.  She denies any recent trauma, states that she has never had any pain like this before.  She is concerned that she may have a urinary tract infection, says that she occasionally has foul odors in her urine, mostly after drinking Coke. She denies any dysuria, hematuria, urgency, or increased frequency.  She reports that she has began sleeping in a recliner and using a heating pad, both of which help her pain.  She also reports that 2 days ago she had a headache that started in the back of her head, and then felt like a band across her forehead. She denies any visual changes, lightheadedness, dizziness, and states that this has fully resolved.  HPI  Past Medical History:  Diagnosis Date  . ABFND PAP SMEAR HGSIL 08/22/2007  . Anemia due to chronic blood loss - reports history of evaluation and told due to menorrhagia 05/30/2014  . ANXIETY 08/22/2007  . Anxiety and depression   . Frequent headaches   . GERD 08/22/2007  . Hyperglycemia   . Hyperlipemia   . HYPERTENSION, BENIGN ESSENTIAL 08/22/2007  . 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 Obesity 02/03/2015  . PLANTAR FASCIITIS, RIGHT 07/01/2008  . PSORIASIS 08/22/2007    Patient Active Problem List   Diagnosis Date Noted  . Anxiety and depression 02/03/2015  . Hyperlipemia 02/03/2015  . Hyperglycemia 02/03/2015  . Obesity 02/03/2015  . Anemia due to chronic blood loss - reports history of evaluation and told due to menorrhagia 05/30/2014  . 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 HYPERTENSION, BENIGN ESSENTIAL 08/22/2007  . GERD 08/22/2007  . ABFND PAP SMEAR HGSIL  08/22/2007    Past Surgical History:  Procedure Laterality Date  . LEEP  2009   CIN I negative margin  . SKIN CANCER EXCISION  2010   chest area    OB History    Gravida Para Term Preterm AB Living   1 1 1     1    SAB TAB Ectopic Multiple Live Births           1       Home Medications    Prior to Admission medications   Medication Sig Start Date End Date Taking? Authorizing Provider  albuterol (PROVENTIL) (2.5 MG/3ML) 0.083% nebulizer solution Take 6 mLs (5 mg total) by nebulization every 4 (four) hours as needed for wheezing. Patient not taking: Reported on 02/10/2017 12/27/16   Carlisle Cater, PA-C  azithromycin [facility]) 250 MG tablet Take two tablets together on day one then one tablet daily thereafter until completion Patient not taking: Reported on 02/10/2017 12/31/16   Clent Demark, PA-C  Clotrimazole 1 % OINT Apply 1 application topically 2 (two) times daily. 12/14/16   Maren Reamer, MD  cyclobenzaprine (FLEXERIL) 10 MG tablet Take 1 tablet (10 mg total) by mouth at bedtime as needed for muscle spasms. 08/21/17   Lorin Glass, PA-C  escitalopram (LEXAPRO) 20 MG tablet Take 0.5 tablets (10 mg total) by mouth daily. 04/25/17   Maren Reamer, MD  famotidine (PEPCID) 20 MG tablet Take  1 tablet (20 mg total) by mouth 2 (two) times daily. 04/25/17 04/25/18  Maren Reamer, MD  hydrochlorothiazide (HYDRODIURIL) 25 MG tablet Take 25 mg by mouth daily.    [provider]  ketoconazole (NIZORAL) 2 % cream Apply 1 application topically daily. 04/25/17   Maren Reamer, MD  pravastatin (PRAVACHOL) 40 MG tablet Take 1 tablet (40 mg total) by mouth daily. 03/16/17   Maren Reamer, MD    Family History Family History  Problem Relation Age of Onset  . Diabetes Mother   . Hypertension Mother   . Fibroids Mother   . Heart disease Father   . Hypertension Father   . Heart attack Father   . Diabetes Brother   . Hypertension Brother   . Diabetes Brother      Social History Social History  Substance Use Topics  . Smoking status: Never Smoker  . Smokeless tobacco: Never Used  . Alcohol use No     Allergies   Lidocaine; Lisinopril-hydrochlorothiazide; and Penicillins   Review of Systems Review of Systems  Constitutional: Negative for chills, fatigue, fever and unexpected weight change.  HENT: Negative for ear pain and sore throat.   Eyes: Negative for pain and visual disturbance.  Respiratory: Negative for cough and shortness of breath.   Cardiovascular: Negative for chest pain and palpitations.  Gastrointestinal: Positive for nausea. Negative for abdominal pain and vomiting.  Genitourinary: Positive for flank pain. Negative for decreased urine volume, difficulty urinating, dysuria, frequency, hematuria, menstrual problem, urgency and vaginal bleeding.  Musculoskeletal: Negative for arthralgias and back pain.  Skin: Negative for color change and rash.  Neurological: Negative for seizures, syncope and headaches.  All other systems reviewed and are negative.    Physical Exam Updated Vital Signs BP (!) 148/76   Pulse 77   Temp 97.6 F (36.4 C) (Oral)   Resp 18   Ht 5\' 5"  (1.651 m)   Wt 127 kg (280 lb)   LMP 08/07/2017 (Approximate)   SpO2 99%   BMI 46.59 kg/m   Physical Exam  Constitutional: She is oriented to person, place, and time. She appears well-developed and well-nourished. No distress.  HENT:  Head: Normocephalic and atraumatic.  Mouth/Throat: Oropharynx is clear and moist.  Eyes: Conjunctivae are normal. Right eye exhibits no discharge. Left eye exhibits no discharge. No scleral icterus.  Neck: Normal range of motion.  Cardiovascular: Normal rate, regular rhythm and intact distal pulses.  Exam reveals no friction rub.   No murmur heard. 2+ radial, DP, PT pulses present bilaterally.  Pulmonary/Chest: Effort normal and breath sounds normal. No stridor. No respiratory distress. She has no wheezes. She has no  rales.  Abdominal: Soft. Normal appearance and bowel sounds are normal. She exhibits no distension and no mass. There is no tenderness. There is no rigidity, no rebound, no guarding and no CVA tenderness.  Musculoskeletal: She exhibits no edema or deformity.  No obvious TTP over her cervical, thoracic, or lumbar back.  Neurological: She is alert and oriented to person, place, and time. No sensory deficit. She exhibits normal muscle tone.  Skin: Skin is warm and dry. She is not diaphoretic.  Psychiatric: She has a normal mood and affect. Her behavior is normal.  Nursing note and vitals reviewed.    ED Treatments / Results  Labs (all labs ordered are listed, but only abnormal results are displayed) Labs Reviewed  URINALYSIS, ROUTINE W REFLEX MICROSCOPIC - Abnormal; Notable for the following:  Result Value   Hgb urine dipstick SMALL (*)    Protein, ur 100 (*)    Squamous Epithelial / LPF 0-5 (*)    All other components within normal limits  URINE CULTURE  PREGNANCY, URINE    EKG  EKG Interpretation None       Radiology No results found.  Procedures Procedures (including critical care time)  Medications Ordered in ED Medications - No data to display   Initial Impression / Assessment and Plan / ED Course  I have reviewed the triage vital signs and the nursing notes.  Pertinent labs & imaging results that were available during my care of the patient were reviewed by me and considered in my medical decision making (see chart for details).    Patient with back pain.  No neurological deficits and normal neuro exam.  Patient can walk but states is painful.  No loss of bowel or bladder control.  No concern for cauda equina.  No fever, night sweats, weight loss, IVDU.  She does not have any tenderness to percussion of bilateral CVA areas.  She has intact sensation, and circulatory function to her bilateral upper and lower extremities. Abdomen is soft, nontender, nondistended.  UA with hematuria, and protein without obvious signs consistent with infection. Urine sent for culture. Based on her hematuria, and pain that I was not easily able to re-create with palpation, especially with her history of melanoma, I suggested that she have a CT scan, and blood work. Patient made the informed decision to decline those at this time.   I strongly suggested the patient follow up with her primary care doctor regarding her symptoms, hematuria, and proteinuria.  After discussions with patient the joint decision was made to attempt treatment for muscle spasms with Flexeril. She was instructed not to drive, or operate heavy machinery or perform other potentially dangerous tasks while taking this medicine. RICE protocol and pain medicine indicated and discussed with patient.   Final Clinical Impressions(s) / ED Diagnoses   Final diagnoses:  Flank pain    New Prescriptions Discharge Medication List as of 08/21/2017 11:24 PM    START taking these medications   Details  cyclobenzaprine (FLEXERIL) 10 MG tablet Take 1 tablet (10 mg total) by mouth at bedtime as needed for muscle spasms., Starting Sun 08/21/2017, Print         Lorin Glass, PA-C 08/21/17 2346    Tegeler, Gwenyth Allegra, MD 08/22/17 [phone]

## 2017-08-22 MED FILL — ?CYCLOBENZAPRINE 10 MG TABL: 10 | 10 days supply | Qty: 10 | Fill #0

## 2017-08-23 LAB — URINE CULTURE

## 2017-09-29 ENCOUNTER — Encounter: Payer: Self-pay | Admitting: Physician Assistant

## 2017-09-29 ENCOUNTER — Ambulatory Visit (HOSPITAL_COMMUNITY): Payer: Self-pay

## 2017-09-29 ENCOUNTER — Encounter (HOSPITAL_BASED_OUTPATIENT_CLINIC_OR_DEPARTMENT_OTHER): Payer: Self-pay

## 2017-09-29 ENCOUNTER — Ambulatory Visit: Payer: Self-pay | Attending: Internal Medicine | Admitting: Physician Assistant

## 2017-09-29 VITALS — BP 160/97 | HR 104 | Temp 98.3°F | Resp 18 | Ht 67.0 in | Wt 285.0 lb

## 2017-09-29 DIAGNOSIS — Z7189 Other specified counseling: Secondary | ICD-10-CM | POA: Insufficient documentation

## 2017-09-29 DIAGNOSIS — E785 Hyperlipidemia, unspecified: Secondary | ICD-10-CM | POA: Insufficient documentation

## 2017-09-29 DIAGNOSIS — E669 Obesity, unspecified: Secondary | ICD-10-CM

## 2017-09-29 DIAGNOSIS — F329 Major depressive disorder, single episode, unspecified: Secondary | ICD-10-CM | POA: Insufficient documentation

## 2017-09-29 DIAGNOSIS — I10 Essential (primary) hypertension: Secondary | ICD-10-CM

## 2017-09-29 DIAGNOSIS — Z85820 Personal history of malignant melanoma of skin: Secondary | ICD-10-CM | POA: Insufficient documentation

## 2017-09-29 DIAGNOSIS — K219 Gastro-esophageal reflux disease without esophagitis: Secondary | ICD-10-CM | POA: Insufficient documentation

## 2017-09-29 DIAGNOSIS — Z79899 Other long term (current) drug therapy: Secondary | ICD-10-CM | POA: Insufficient documentation

## 2017-09-29 DIAGNOSIS — Z23 Encounter for immunization: Secondary | ICD-10-CM

## 2017-09-29 DIAGNOSIS — R5383 Other fatigue: Secondary | ICD-10-CM

## 2017-09-29 DIAGNOSIS — Z0189 Encounter for other specified special examinations: Secondary | ICD-10-CM | POA: Insufficient documentation

## 2017-09-29 DIAGNOSIS — F419 Anxiety disorder, unspecified: Secondary | ICD-10-CM | POA: Insufficient documentation

## 2017-09-29 DIAGNOSIS — L409 Psoriasis, unspecified: Secondary | ICD-10-CM | POA: Insufficient documentation

## 2017-09-29 MED ORDER — AMLODIPINE BESYLATE 5 MG PO TABS: 5.0000 mg | ORAL_TABLET | Freq: Every day | ORAL | 3 refills | Status: DC

## 2017-09-29 MED ORDER — HYDROCHLOROTHIAZIDE 25 MG PO TABS: 25.0000 mg | ORAL_TABLET | Freq: Every day | ORAL | 1 refills | Status: DC

## 2017-09-29 MED FILL — AMLODIPINE BESYLATE 5 MG TA: 5 | 30 days supply | Qty: 30 | Fill #0

## 2017-09-29 MED FILL — HYDROCHLOROTHIAZIDE 25 MG T: 25 | 30 days supply | Qty: 30 | Fill #0

## 2017-09-29 NOTE — Progress Notes (Signed)
Janet [name], is a 46 y.o. female  UXL:[id number]  ZDG:[id number]  DOB - 03-04-1971  Subjective:  Chief Complaint and HPI: Janet [name] is a 46 y.o. female here today for several concerns.  She is concerned about her weight and not being able to lose weight.  Admits to eating a lot of pasta and drinking sugar-sodas and sweet tea.  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  Also c/o fatigue all the time and no energy.  Concerned she might have B12 deficiency bc her dad does.   Bottom of great toes feel numb for a few months.    BP is uncontrolled.  She does often have HAs.  She denies vision changes.  No CP/SOB/dizziness.  Was previously on Lisinopril/HCT which she said gave her leg cramps but HCTZ alone doesn't cause leg cramps.    ROS:   Constitutional:  No f/c, No night sweats, No unexplained weight loss. EENT:  No vision changes, No blurry vision, No hearing changes. No mouth, throat, or ear problems.  Respiratory: No cough, No SOB Cardiac: No CP, no palpitations GI:  No abd pain, No N/V/D. GU: No Urinary s/sx Musculoskeletal: No joint pain Neuro: No headache, no dizziness, no motor weakness.  Skin: +scalp rash(used to get a "mousse" prescribed for this but doesn't remember name.   Endocrine:  No polydipsia. No polyuria.  Psych: Denies SI/HI  No problems updated.  ALLERGIES: Allergies  Allergen Reactions  . Lidocaine     ? Lidocaine allergy? Reports heart racing when has "shot of medicine in cevix" for leep  . Lisinopril-Hydrochlorothiazide     Leg cramps.  . Penicillins Itching    PAST MEDICAL HISTORY: Past Medical History:  Diagnosis Date  . ABFND PAP SMEAR HGSIL 08/22/2007  . Anemia due to chronic blood loss - reports history of evaluation and told due to menorrhagia 05/30/2014  . ANXIETY 08/22/2007  . Anxiety and depression   . Frequent headaches   . GERD 08/22/2007  . Hyperglycemia   . Hyperlipemia   . HYPERTENSION,  BENIGN ESSENTIAL 08/22/2007  . 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 Obesity 02/03/2015  . PLANTAR FASCIITIS, RIGHT 07/01/2008  . PSORIASIS 08/22/2007    MEDICATIONS AT HOME: Prior to Admission medications   Medication Sig Start Date End Date Taking? Authorizing Provider  Clotrimazole 1 % OINT Apply 1 application topically 2 (two) times daily. 12/14/16  Yes Langeland, Dawn T, MD  cyclobenzaprine (FLEXERIL) 10 MG tablet Take 1 tablet (10 mg total) by mouth at bedtime as needed for muscle spasms. 08/21/17  Yes Lorin Glass, PA-C  famotidine (PEPCID) 20 MG tablet Take 1 tablet (20 mg total) by mouth 2 (two) times daily. 04/25/17 04/25/18 Yes Langeland, Dawn T, MD  hydrochlorothiazide (HYDRODIURIL) 25 MG tablet Take 1 tablet (25 mg total) by mouth daily. 09/29/17  Yes Mory Herrman M, PA-C  ketoconazole (NIZORAL) 2 % cream Apply 1 application topically daily. 04/25/17  Yes Langeland, Dawn T, MD  pravastatin (PRAVACHOL) 40 MG tablet Take 1 tablet (40 mg total) by mouth daily. 03/16/17  Yes Langeland, Dawn T, MD  albuterol (PROVENTIL) (2.5 MG/3ML) 0.083% nebulizer solution Take 6 mLs (5 mg total) by nebulization every 4 (four) hours as needed for wheezing. Patient not taking: Reported on 02/10/2017 12/27/16   Carlisle Cater, PA-C  amLODipine (NORVASC) 5 MG tablet Take 1 tablet (5 mg total) by mouth daily. 09/29/17   Argentina Donovan, PA-C  escitalopram (LEXAPRO) 20 MG  tablet Take 0.5 tablets (10 mg total) by mouth daily. Patient not taking: Reported on 09/29/2017 04/25/17   Maren Reamer, MD     Objective:  EXAM:   Vitals:   09/29/17 1501  BP: (!) 160/97  Pulse: (!) 104  Resp: 18  Temp: 98.3 F (36.8 C)  TempSrc: Oral  SpO2: 100%  Weight: 285 lb (129.3 kg)  Height: 5\' 7"  (1.702 m)    General appearance : A&OX3. NAD. Non-toxic-appearing HEENT: Atraumatic and Normocephalic.  PERRLA. EOM intact.  Neck: supple, no JVD. No cervical lymphadenopathy. No thyromegaly Chest/Lungs:   Breathing-non-labored, Good air entry bilaterally, breath sounds normal without rales, rhonchi, or wheezing  CVS: S1 S2 regular, no murmurs, gallops, rubs  Extremities: Bilateral Lower Ext shows no edema, both legs are warm to touch with = pulse throughout Neurology:  CN II-XII grossly intact, Non focal.   Psych:  TP linear. J/I WNL. Normal speech. Appropriate eye contact and affect.  Skin:  +scalp Rash-?psoriasis  Data Review Lab Results  Component Value Date   HGBA1C 5.5 12/14/2016   HGBA1C 6.3 10/23/2014   HGBA1C 6.0 05/30/2014     Assessment & Plan   1. Fatigue, unspecified type - CBC with Differential/Platelet - TSH - Vitamin D, 25-hydroxy - Vitamin B12  2. Hypertension, unspecified type Uncontrolled  - Basic metabolic panel continue- hydrochlorothiazide (HYDRODIURIL) 25 MG tablet; Take 1 tablet (25 mg total) by mouth daily.  Dispense: 90 tablet; Refill: 1 Start/add- amLODipine (NORVASC) 5 MG tablet; Take 1 tablet (5 mg total) by mouth daily.  Dispense: 90 tablet; Refill: 3  3. Obesity, unspecified classification, unspecified obesity type, unspecified whether serious comorbidity present Healthy diet and exercise encourage.  I also encouraged OA/12 step meetings.  I have encouraged small sustainable changes for big impact.  Cut out sodas/sweet tea. [facility].  Drink 80 ounces of water daily.  I have had a lengthy discussion and provided education about insulin resistance and the intake of too much sugar/refined carbohydrates.  I have advised the patient to work at a goal of eliminating sugary drinks, candy, desserts, sweets, refined sugars, processed foods, and white carbohydrates.  The patient expresses understanding.    4. Needs flu shot - Flu Vaccine QUAD 6+ mos PF IM (Fluarix Quad PF)  Patient have been counseled extensively about nutrition and exercise. Spent 31mins face to face with patient and more than half on counseling related to healthy lifestyle changes,  weight loss/exercise  Return in about 6 weeks (around 11/10/2017) for assign new PCP; f/up htn and fatigue.  The patient was given clear instructions to go to ER or return to medical center if symptoms don't improve, worsen or new problems develop. The patient verbalized understanding. The patient was told to call to get lab results if they haven't heard anything in the next week.     Freeman Caldron, PA-C [facility] [place] [facility], [place]   09/29/2017, 3:32 PM

## 2017-09-29 NOTE — Patient Instructions (Signed)
Check blood pressure 2-3 times/week and record and bring to next visit.   Hypertension Hypertension, commonly called high blood pressure, is when the force of blood pumping through the arteries is too strong. The arteries are the blood vessels that carry blood from the heart throughout the body. Hypertension forces the heart to work harder to pump blood and may cause arteries to become narrow or stiff. Having untreated or uncontrolled hypertension can cause heart attacks, strokes, kidney disease, and other problems. A blood pressure reading consists of a higher number over a lower number. Ideally, your blood pressure should be below 120/80. The first ("top") number is called the systolic pressure. It is a measure of the pressure in your arteries as your heart beats. The second ("bottom") number is called the diastolic pressure. It is a measure of the pressure in your arteries as the heart relaxes. What are the causes? The cause of this condition is not known. What increases the risk? Some risk factors for high blood pressure are under your control. Others are not. Factors you can change  Smoking.  Having type 2 diabetes mellitus, high cholesterol, or both.  Not getting enough exercise or physical activity.  Being overweight.  Having too much fat, sugar, calories, or salt (sodium) in your diet.  Drinking too much alcohol. Factors that are difficult or impossible to change  Having chronic kidney disease.  Having a family history of high blood pressure.  Age. Risk increases with age.  Race. You may be at higher risk if you are African-American.  Gender. Men are at higher risk than women before age 60. After age 64, women are at higher risk than men.  Having obstructive sleep apnea.  Stress. What are the signs or symptoms? Extremely high blood pressure (hypertensive crisis) may cause:  Headache.  Anxiety.  Shortness of breath.  Nosebleed.  Nausea and vomiting.  Severe  chest pain.  Jerky movements you cannot control (seizures).  How is this diagnosed? This condition is diagnosed by measuring your blood pressure while you are seated, with your arm resting on a surface. The cuff of the blood pressure monitor will be placed directly against the skin of your upper arm at the level of your heart. It should be measured at least twice using the same arm. Certain conditions can cause a difference in blood pressure between your right and left arms. Certain factors can cause blood pressure readings to be lower or higher than normal (elevated) for a short period of time:  When your blood pressure is higher when you are in a health care provider's office than when you are at home, this is called white coat hypertension. Most people with this condition do not need medicines.  When your blood pressure is higher at home than when you are in a health care provider's office, this is called masked hypertension. Most people with this condition may need medicines to control blood pressure.  If you have a high blood pressure reading during one visit or you have normal blood pressure with other risk factors:  You may be asked to return on a different day to have your blood pressure checked again.  You may be asked to monitor your blood pressure at home for 1 week or longer.  If you are diagnosed with hypertension, you may have other blood or imaging tests to help your health care provider understand your overall risk for other conditions. How is this treated? This condition is treated by making healthy lifestyle  changes, such as eating healthy foods, exercising more, and reducing your alcohol intake. Your health care provider may prescribe medicine if lifestyle changes are not enough to get your blood pressure under control, and if:  Your systolic blood pressure is above 130.  Your diastolic blood pressure is above 80.  Your personal target blood pressure may vary depending on  your medical conditions, your age, and other factors. Follow these instructions at home: Eating and drinking  Eat a diet that is high in fiber and potassium, and low in sodium, added sugar, and fat. An example eating plan is called the DASH (Dietary Approaches to Stop Hypertension) diet. To eat this way: ? Eat plenty of fresh fruits and vegetables. Try to fill half of your plate at each meal with fruits and vegetables. ? Eat whole grains, such as whole wheat pasta, brown rice, or whole grain bread. Fill about one quarter of your plate with whole grains. ? Eat or drink low-fat dairy products, such as skim milk or low-fat yogurt. ? Avoid fatty cuts of meat, processed or cured meats, and poultry with skin. Fill about one quarter of your plate with lean proteins, such as fish, chicken without skin, beans, eggs, and tofu. ? Avoid premade and processed foods. These tend to be higher in sodium, added sugar, and fat.  Reduce your daily sodium intake. Most people with hypertension should eat less than 1,500 mg of sodium a day.  Limit alcohol intake to no more than 1 drink a day for nonpregnant women and 2 drinks a day for men. One drink equals 12 oz of beer, 5 oz of wine, or 1 oz of hard liquor. Lifestyle  Work with your health care provider to maintain a healthy body weight or to lose weight. Ask what an ideal weight is for you.  Get at least 30 minutes of exercise that causes your heart to beat faster (aerobic exercise) most days of the week. Activities may include walking, swimming, or biking.  Include exercise to strengthen your muscles (resistance exercise), such as pilates or lifting weights, as part of your weekly exercise routine. Try to do these types of exercises for 30 minutes at least 3 days a week.  Do not use any products that contain nicotine or tobacco, such as cigarettes and e-cigarettes. If you need help quitting, ask your health care provider.  Monitor your blood pressure at home  as told by your health care provider.  Keep all follow-up visits as told by your health care provider. This is important. Medicines  Take over-the-counter and prescription medicines only as told by your health care provider. Follow directions carefully. Blood pressure medicines must be taken as prescribed.  Do not skip doses of blood pressure medicine. Doing this puts you at risk for problems and can make the medicine less effective.  Ask your health care provider about side effects or reactions to medicines that you should watch for. Contact a health care provider if:  You think you are having a reaction to a medicine you are taking.  You have headaches that keep coming back (recurring).  You feel dizzy.  You have swelling in your ankles.  You have trouble with your vision. Get help right away if:  You develop a severe headache or confusion.  You have unusual weakness or numbness.  You feel faint.  You have severe pain in your chest or abdomen.  You vomit repeatedly.  You have trouble breathing. Summary  Hypertension is when the force  of blood pumping through your arteries is too strong. If this condition is not controlled, it may put you at risk for serious complications.  Your personal target blood pressure may vary depending on your medical conditions, your age, and other factors. For most people, a normal blood pressure is less than 120/80.  Hypertension is treated with lifestyle changes, medicines, or a combination of both. Lifestyle changes include weight loss, eating a healthy, low-sodium diet, exercising more, and limiting alcohol. This information is not intended to replace advice given to you by your health care provider. Make sure you discuss any questions you have with your health care provider. Document Released: 11/22/2005 Document Revised: 10/20/2016 Document Reviewed: 10/20/2016 Elsevier Interactive Patient Education  [employer].

## 2017-09-30 LAB — CBC WITH DIFFERENTIAL/PLATELET
BASOS ABS: 0 10*3/uL (ref 0.0–0.2)
Basos: 0 %
EOS (ABSOLUTE): 0.2 10*3/uL (ref 0.0–0.4)
Eos: 2 %
HEMOGLOBIN: 12.5 g/dL (ref 11.1–15.9)
Hematocrit: 36.1 % (ref 34.0–46.6)
Immature Grans (Abs): 0 10*3/uL (ref 0.0–0.1)
Immature Granulocytes: 0 %
LYMPHS ABS: 3 10*3/uL (ref 0.7–3.1)
Lymphs: 39 %
MCH: 28.9 pg (ref 26.6–33.0)
MCHC: 34.6 g/dL (ref 31.5–35.7)
MCV: 84 fL (ref 79–97)
MONOCYTES: 9 %
MONOS ABS: 0.7 10*3/uL (ref 0.1–0.9)
Neutrophils Absolute: 3.8 10*3/uL (ref 1.4–7.0)
Neutrophils: 50 %
PLATELETS: 336 10*3/uL (ref 150–379)
RBC: 4.32 x10E6/uL (ref 3.77–5.28)
RDW: 14.5 % (ref 12.3–15.4)
WBC: 7.7 10*3/uL (ref 3.4–10.8)

## 2017-09-30 LAB — BASIC METABOLIC PANEL
BUN / CREAT RATIO: 22 (ref 9–23)
BUN: 13 mg/dL (ref 6–24)
CHLORIDE: 101 mmol/L (ref 96–106)
CO2: 24 mmol/L (ref 20–29)
Calcium: 9.7 mg/dL (ref 8.7–10.2)
Creatinine, Ser: 0.6 mg/dL (ref 0.57–1.00)
GFR calc non Af Amer: 111 mL/min/{1.73_m2} (ref >59)
GFR, EST AFRICAN AMERICAN: 128 mL/min/{1.73_m2} (ref >59)
Glucose: 92 mg/dL (ref 65–99)
POTASSIUM: 4.2 mmol/L (ref 3.5–5.2)
SODIUM: 143 mmol/L (ref 134–144)

## 2017-09-30 LAB — VITAMIN D 25 HYDROXY (VIT D DEFICIENCY, FRACTURES): Vit D, 25-Hydroxy: 15.7 ng/mL — ABNORMAL LOW (ref 30.0–100.0)

## 2017-09-30 LAB — TSH: TSH: 3.02 u[IU]/mL (ref 0.450–4.500)

## 2017-09-30 LAB — VITAMIN B12: Vitamin B-12: 178 pg/mL — ABNORMAL LOW (ref 232–1245)

## 2017-10-03 ENCOUNTER — Other Ambulatory Visit: Payer: Self-pay | Admitting: Physician Assistant

## 2017-10-03 ENCOUNTER — Telehealth (HOSPITAL_COMMUNITY): Payer: Self-pay | Admitting: *Deleted

## 2017-10-03 DIAGNOSIS — E559 Vitamin D deficiency, unspecified: Secondary | ICD-10-CM

## 2017-10-03 DIAGNOSIS — E539 Vitamin B deficiency, unspecified: Secondary | ICD-10-CM

## 2017-10-03 MED ORDER — VITAMIN B-12 1000 MCG PO TABS: 1000.0000 ug | ORAL_TABLET | Freq: Every day | ORAL | 1 refills | Status: DC

## 2017-10-03 MED ORDER — VITAMIN D (ERGOCALCIFEROL) 1.25 MG (50000 UNIT) PO CAPS: 50000.0000 [IU] | ORAL_CAPSULE | ORAL | 0 refills | Status: DC

## 2017-10-03 NOTE — Telephone Encounter (Signed)
Telephoned patient at home number and left message to return call to [facility] 

## 2017-10-07 ENCOUNTER — Telehealth: Payer: Self-pay | Admitting: Internal Medicine

## 2017-10-07 NOTE — Telephone Encounter (Signed)
Pt called requesting lab results , please f/up °

## 2017-10-18 ENCOUNTER — Ambulatory Visit (HOSPITAL_COMMUNITY)
Admission: RE | Admit: 2017-10-18 | Discharge: 2017-10-18 | Disposition: A | Discharge status: Home / Self Care | Payer: Self-pay | Source: Ambulatory Visit | Attending: Obstetrics and Gynecology | Admitting: Obstetrics and Gynecology

## 2017-10-18 ENCOUNTER — Encounter (HOSPITAL_COMMUNITY): Payer: Self-pay

## 2017-10-18 VITALS — BP 160/100 | Temp 98.1°F | Ht 67.0 in | Wt 282.0 lb

## 2017-10-18 DIAGNOSIS — Z01419 Encounter for gynecological examination (general) (routine) without abnormal findings: Secondary | ICD-10-CM

## 2017-10-18 DIAGNOSIS — N898 Other specified noninflammatory disorders of vagina: Secondary | ICD-10-CM

## 2017-10-18 NOTE — Addendum Note (Signed)
Encounter addended by: Armond Hang, LPN on: 84/78/4128 2:08 PM  Actions taken: Order list changed

## 2017-10-18 NOTE — Patient Instructions (Addendum)
Explained to Janet [name] that [facility] will cover Pap smears and HPV typing every 5 years unless has a history of abnormal Pap smears. Let patient know will follow up with her within the next couple weeks with results of Pap smear and wet prep by phone. Betances verbalized understanding.  Apostolos Blagg, Arvil Chaco, RN 3:27 PM

## 2017-10-18 NOTE — Addendum Note (Signed)
Encounter addended by: Armond Hang, LPN on: 21/62/4469 5:07 PM  Actions taken: Order list changed

## 2017-10-18 NOTE — Progress Notes (Signed)
No complaints today.   Pap Smear: Pap smear completed today. Last Pap smear was 07/29/2014 and normal with negative HPV. 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 Patient has had three normal Pap smears completed since LEEP that includes her most recent Pap smear, 04/12/2011, and 01/21/2010. Last three Pap smears, Colposcopy, and LEEP results are in EPIC.  Pelvic/Bimanual   Ext Genitalia No lesions, no swelling and no discharge observed on external genitalia.         Vagina Vagina pink and normal texture. No lesions and thick white yeast appearing discharge observed in vagina. Wet prep completed.          Cervix Cervix is present. Cervix pink and of normal texture. Thick white yeast appearing discharge observed on cervix.    Uterus Uterus is present and palpable. Uterus in normal position and normal size.        Adnexae Bilateral ovaries present and unable to palpate. No tenderness on palpation.          Rectovaginal No rectal exam completed today since patient had no rectal complaints. No skin abnormalities observed on exam.    Smoking History: Patient has never smoked.  Patient Navigation: Patient education provided. Access to services provided for patient through [facility] program.

## 2017-10-18 NOTE — Telephone Encounter (Signed)
Medical Assistant left message on patient's home and cell voicemail. Voicemail states to give a call back to [place] with [facility] at [phone]. !!!Please inform patient of vitamin d level being low and causing fatigue. Patient needs to pick up prescription and take once a week. Patients B 12 is low and she should pick up over the counter B 12 and take 1000 mg daily. Patient will have a recheck in 3-4 months!!!

## 2017-10-18 NOTE — Telephone Encounter (Signed)
-----   Message from [place] Donovan, [place] sent at 10/03/2017  9:32 AM EDT ----- Your vitamin D is low.   This can contribute to muscle aches, anxiety, fatigue, and depression.  I have sent a prescription to the pharmacy for you to take once a week.  Your B12 was a little low.  You can get over the counter vitamin B12 and take about 1016mcg daily. We will recheck this level in 3-4 months.  Your other labs look good. Thanks,  Freeman Caldron, PA-C

## 2017-10-20 LAB — CYTOLOGY - PAP
Bacterial vaginitis: NEGATIVE
CANDIDA VAGINITIS: NEGATIVE
DIAGNOSIS: NEGATIVE
TRICH (WINDOWPATH): NEGATIVE

## 2017-10-20 MED FILL — VIT D2 1.25 MG (50,000 UNIT: 1.25 MG | 28 days supply | Qty: 4 | Fill #0

## 2017-10-24 ENCOUNTER — Telehealth (HOSPITAL_COMMUNITY): Payer: Self-pay | Admitting: *Deleted

## 2017-10-24 NOTE — Telephone Encounter (Signed)
Patient called asking if a prescription was sent to her pharmacy for vaginal discharge. Let patient know that her Pap smear was normal and HPV negative. Told patient that her next Pap smear is due in 5 years due to having at least three normal Pap smears in row since her last abnormal Pap smear. Informed patient that her wet prep was negative for yeast, BV, and trichomonas. Told patient if she notices any discharge she can try OTC Monistat. Let patient know that if concerned about an STD she can go to the [facility] to be tested and treated free of charge. Patient verbalized understanding.

## 2017-11-04 ENCOUNTER — Ambulatory Visit: Payer: Self-pay | Attending: Physician Assistant

## 2017-11-11 ENCOUNTER — Ambulatory Visit: Payer: Self-pay | Admitting: Internal Medicine

## 2017-11-21 MED FILL — ?FAMOTIDINE 20 MG TABLET: 20 | 30 days supply | Qty: 60 | Fill #1

## 2017-11-21 MED FILL — VIT D2 1.25 MG (50,000 UNIT: 1.25 MG | 28 days supply | Qty: 4 | Fill #1

## 2017-11-21 MED FILL — PRAVASTATIN NA 40 MG TAB: 40 | 30 days supply | Qty: 30 | Fill #3

## 2018-01-06 ENCOUNTER — Ambulatory Visit: Payer: Self-pay | Admitting: Internal Medicine

## 2018-02-14 ENCOUNTER — Ambulatory Visit: Payer: Self-pay | Attending: Internal Medicine | Admitting: Internal Medicine

## 2018-02-14 ENCOUNTER — Encounter: Payer: Self-pay | Admitting: Internal Medicine

## 2018-02-14 VITALS — BP 146/96 | HR 92 | Temp 97.6°F | Resp 16 | Ht 66.0 in | Wt 283.3 lb

## 2018-02-14 DIAGNOSIS — E785 Hyperlipidemia, unspecified: Secondary | ICD-10-CM | POA: Insufficient documentation

## 2018-02-14 DIAGNOSIS — E559 Vitamin D deficiency, unspecified: Secondary | ICD-10-CM

## 2018-02-14 DIAGNOSIS — Z85820 Personal history of malignant melanoma of skin: Secondary | ICD-10-CM | POA: Insufficient documentation

## 2018-02-14 DIAGNOSIS — I10 Essential (primary) hypertension: Secondary | ICD-10-CM

## 2018-02-14 DIAGNOSIS — F329 Major depressive disorder, single episode, unspecified: Secondary | ICD-10-CM

## 2018-02-14 DIAGNOSIS — Z8249 Family history of ischemic heart disease and other diseases of the circulatory system: Secondary | ICD-10-CM | POA: Insufficient documentation

## 2018-02-14 DIAGNOSIS — K219 Gastro-esophageal reflux disease without esophagitis: Secondary | ICD-10-CM | POA: Insufficient documentation

## 2018-02-14 DIAGNOSIS — Z79899 Other long term (current) drug therapy: Secondary | ICD-10-CM | POA: Insufficient documentation

## 2018-02-14 DIAGNOSIS — L219 Seborrheic dermatitis, unspecified: Secondary | ICD-10-CM

## 2018-02-14 DIAGNOSIS — Z9889 Other specified postprocedural states: Secondary | ICD-10-CM | POA: Insufficient documentation

## 2018-02-14 DIAGNOSIS — Z880 Allergy status to penicillin: Secondary | ICD-10-CM | POA: Insufficient documentation

## 2018-02-14 DIAGNOSIS — R739 Hyperglycemia, unspecified: Secondary | ICD-10-CM | POA: Insufficient documentation

## 2018-02-14 DIAGNOSIS — L409 Psoriasis, unspecified: Secondary | ICD-10-CM

## 2018-02-14 DIAGNOSIS — E538 Deficiency of other specified B group vitamins: Secondary | ICD-10-CM

## 2018-02-14 DIAGNOSIS — F419 Anxiety disorder, unspecified: Secondary | ICD-10-CM

## 2018-02-14 DIAGNOSIS — Z888 Allergy status to other drugs, medicaments and biological substances status: Secondary | ICD-10-CM | POA: Insufficient documentation

## 2018-02-14 DIAGNOSIS — Z6841 Body Mass Index (BMI) 40.0 and over, adult: Secondary | ICD-10-CM

## 2018-02-14 DIAGNOSIS — Z833 Family history of diabetes mellitus: Secondary | ICD-10-CM | POA: Insufficient documentation

## 2018-02-14 MED ORDER — LOSARTAN POTASSIUM 25 MG PO TABS: 25.0000 mg | ORAL_TABLET | Freq: Every day | ORAL | 3 refills | Status: DC

## 2018-02-14 MED ORDER — TRIAMCINOLONE ACETONIDE 0.1 % EX CREA: TOPICAL_CREAM | CUTANEOUS | 1 refills | Status: AC

## 2018-02-14 MED ORDER — CLOBETASOL PROPIONATE 0.05 % EX SOLN: CUTANEOUS | 3 refills | Status: DC

## 2018-02-14 MED ORDER — PAROXETINE HCL 10 MG PO TABS: ORAL_TABLET | ORAL | 1 refills | Status: DC

## 2018-02-14 MED FILL — TRIAMCINOLONE ACETONIDE 0.1: 0.1 | 20 days supply | Qty: 45 | Fill #0

## 2018-02-14 MED FILL — LOSARTAN POTASSIUM 25 MG TA: 25 | 30 days supply | Qty: 30 | Fill #0

## 2018-02-14 MED FILL — VIT D2 1.25 MG (50,000 UNIT: 1.25 MG | 28 days supply | Qty: 4 | Fill #2

## 2018-02-14 MED FILL — PARoxetine HCL 10 MG TABS: 10 | 30 days supply | Qty: 30 | Fill #0

## 2018-02-14 NOTE — Progress Notes (Signed)
Patient is here to establish care for hypertension.  Quit taking Amlodipine due to make her leg feels like jello.

## 2018-02-14 NOTE — Progress Notes (Signed)
Patient ID: Janet [name], female    DOB: 11/02/1971  MRN: [id number]  CC: Establish Care   Subjective: Janet [name] is a 47 y.o. female who presents for chronic disease management and to become established with me as PCP.  Last seen by PA in October 2018. Her concerns today include:  Pt with hx of HTN, HL, GERD, obesity  1.  HTN: she stopped Norvasc because it caused discomfort in both legs.  Checks blood pressure when she goes to the grocery store.  It is been running about what it is today which is not controlled. She limits salt in the food. Denies chest pains or shortness of breath.  2.  B12 def: On last visit she requested to have B12 levels checked.  Level was 179.  She was advised to purchase and take B12 supplement 1000 mcg daily OTC.  He has been doing that consistently.  3.  Vit D: Vitamin D level was low at 15.  She has completed the high-dose vitamin D.  4.  Complaining of flaky itchy rash on the scalp for many years.  It is worse in the winter.  Also has scattered dry patches on the arms and legs.  Reports being told by a dermatologist many years ago that she has psoriasis.  She no longer has insurance.  She has been trying several over-the-counter things including Denorex shampoo without improvement.  Her mother also has psoriasis.    5.  Obesity: She has been stressed helping to care for her dad who was recently diagnosed with liver cancer.  He is currently on hospice.  She states that she is an emotional eater and has been eating more.  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     6.  Dep/anxiety:  Did not tolerate Lexapro.  Made her feel too out of it.  She would like to try Paxil which her mother is on and tolerates well.  She feels a lot of her depression anxiety is related to the recent cancer diagnosis given to her father.  No suicidal ideation.  Patient Active Problem List   Diagnosis Date Noted  . Anxiety and  depression 02/03/2015  . Hyperlipemia 02/03/2015  . Hyperglycemia 02/03/2015  . Obesity 02/03/2015  . Anemia due to chronic blood loss - reports history of evaluation and told due to menorrhagia 05/30/2014  . 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 HYPERTENSION, BENIGN ESSENTIAL 08/22/2007  . GERD 08/22/2007  . ABFND PAP SMEAR HGSIL 08/22/2007     Current Outpatient Medications on File Prior to Visit  Medication Sig Dispense Refill  . famotidine (PEPCID) 20 MG tablet Take 1 tablet (20 mg total) by mouth 2 (two) times daily. 60 tablet 3  . hydrochlorothiazide (HYDRODIURIL) 25 MG tablet Take 1 tablet (25 mg total) by mouth daily. 90 tablet 1  . pravastatin (PRAVACHOL) 40 MG tablet Take 1 tablet (40 mg total) by mouth daily. 90 tablet 3  . vitamin B-12 (CYANOCOBALAMIN) 1000 MCG tablet Take 1 tablet (1,000 mcg total) by mouth daily. 100 tablet 1  . albuterol (PROVENTIL) (2.5 MG/3ML) 0.083% nebulizer solution Take 6 mLs (5 mg total) by nebulization every 4 (four) hours as needed for wheezing. (Patient not taking: Reported on 02/10/2017) 75 mL 0  . amLODipine (NORVASC) 5 MG tablet Take 1 tablet (5 mg total) by mouth daily. (Patient not taking: Reported on 02/14/2018) 90 tablet 3  . Clotrimazole 1 % OINT  Apply 1 application topically 2 (two) times daily. (Patient not taking: Reported on 10/18/2017) 56.7 g 2  . cyclobenzaprine (FLEXERIL) 10 MG tablet Take 1 tablet (10 mg total) by mouth at bedtime as needed for muscle spasms. (Patient not taking: Reported on 10/18/2017) 10 tablet 0  . escitalopram (LEXAPRO) 20 MG tablet Take 0.5 tablets (10 mg total) by mouth daily. (Patient not taking: Reported on 09/29/2017) 45 tablet 2  . ketoconazole (NIZORAL) 2 % cream Apply 1 application topically daily. (Patient not taking: Reported on 10/18/2017) 15 g 0  . Vitamin D, Ergocalciferol, (DRISDOL) 50000 units CAPS capsule Take 1 capsule (50,000 Units total) by mouth every 7 (seven) days. (Patient not taking: Reported on  02/14/2018) 16 capsule 0   No current facility-administered medications on file prior to visit.     Allergies  Allergen Reactions  . Lidocaine     ? Lidocaine allergy? Reports heart racing when has "shot of medicine in cevix" for leep  . Lisinopril-Hydrochlorothiazide     Leg cramps.  . Penicillins Itching    Social History   Socioeconomic History  . Marital status: Single    Spouse name: Not on file  . Number of children: Not on file  . Years of education: Not on file  . Highest education level: Not on file  Social Needs  . Financial resource strain: Not on file  . Food insecurity - worry: Not on file  . Food insecurity - inability: Not on file  . Transportation needs - medical: Not on file  . Transportation needs - non-medical: Not on file  Occupational History  . Not on file  Tobacco Use  . Smoking status: Never Smoker  . Smokeless tobacco: Never Used  Substance and Sexual Activity  . Alcohol use: No  . Drug use: No  . Sexual activity: No    Partners: Male    Birth control/protection: None  Other Topics Concern  . Not on file  Social History Narrative   Work or School: daycare in infant room      Home Situation: lives alone      Spiritual Beliefs: none      Lifestyle: no regular exercise; diet is poor             Family History  Problem Relation Age of Onset  . Diabetes Mother   . Hypertension Mother   . Fibroids Mother   . Heart disease Father   . Hypertension Father   . Heart attack Father   . Diabetes Brother   . Hypertension Brother   . Diabetes Brother     Past Surgical History:  Procedure Laterality Date  . LEEP  2009   CIN I negative margin  . SKIN CANCER EXCISION  2010   chest area    ROS: Review of Systems Negative except as stated above. PHYSICAL EXAM: BP (!) 146/96 (BP Location: Left Arm, Patient Position: Sitting, Cuff Size: Large)   Pulse 92   Temp 97.6 F (36.4 C) (Oral)   Resp 16   Ht 5\' 6"  (1.676 m)   Wt 283 lb 4.8  oz (128.5 kg)   SpO2 95%   BMI 45.73 kg/m   Physical Exam  General appearance - alert, well appearing, and in no distress Mental status - alert, oriented to person, place, and time, normal mood, behavior, speech, dress, motor activity, and thought processes Neck - supple, no significant adenopathy Chest - clear to auscultation, no wheezes, rales or rhonchi, symmetric air entry  Heart - normal rate, regular rhythm, normal S1, S2, no murmurs, rubs, clicks or gallops Extremities - peripheral pulses normal, no pedal edema, no clubbing or cyanosis Skin -mild erythema around the hairline.  White flaky plaque covers the scalp.  Small amount behind the ears.  Small erythematous patch on the right lower shin.  Scattered small dry patches on the upper extremities   ASSESSMENT AND PLAN: 1. Essential hypertension Norvasc.  Continue HCTZ.  Add Cozaar - losartan (COZAAR) 25 MG tablet; Take 1 tablet (25 mg total) by mouth daily.  Dispense: 90 tablet; Refill: 3  2. Anxiety and depression .  Lexapro.  Start Paxil.. Follow-up if any increased depression or anxiety on the medication. - PARoxetine (PAXIL) 10 MG tablet; Take 1/2 tab PO daily x 2 weeks then 1 tab daily.  Dispense: 30 tablet; Refill: 1  3. Psoriasis - triamcinolone cream (KENALOG) 0.1 %; Apply BID as needed to affected areas on extremities.  Dispense: 45 g; Refill: 1 - Ambulatory referral to Dermatology  4. Seborrheic dermatitis of scalp Appears more to be related to psoriasis then just dandruff.  Will write for clobetasol solution to apply to the scalp twice a week.  Also recommend use of Selsun Blue or head and shoulder shampoo twice a week. - Ambulatory referral to Dermatology  5. Vitamin B 12 deficiency Continue B12 supplement.  Check levels today - Vitamin B12  6. Vitamin D deficiency Check level.  If levels are okay we will have her take 1000 units daily. - VITAMIN D 25 Hydroxy (Vit-D Deficiency, Fractures)  7. Class 3 severe  obesity due to excess calories without serious comorbidity with body mass index (BMI) of 45.0 to 49.9 in adult [facility]) Counseling given on healthy eating and regular exercise.  Encourage her to get in at least 30 minutes of exercise 3-4 days a week.    Patient was given the opportunity to ask questions.  Patient verbalized understanding of the plan and was able to repeat key elements of the plan.   No orders of the defined types were placed in this encounter.    Requested Prescriptions    No prescriptions requested or ordered in this encounter    No Follow-up on file.  Karle Plumber, MD, FACP

## 2018-02-14 NOTE — Patient Instructions (Signed)
Stop the amlodipine.  Start Losartan 25 mg daily.  Continue to limit salt in the foods.  Stop escitalopram.  Start Paxil instead.  Try to get in some form of aerobic exercise at least 3-4 days a week for 30 minutes.  Try using Selsun Blue or head and shoulder shampoo over-the-counter twice a week for the dandruff on the scalp.  I have referred you to the dermatologist.

## 2018-02-15 ENCOUNTER — Other Ambulatory Visit: Payer: Self-pay | Admitting: Internal Medicine

## 2018-02-15 ENCOUNTER — Telehealth: Payer: Self-pay | Admitting: Internal Medicine

## 2018-02-15 DIAGNOSIS — E539 Vitamin B deficiency, unspecified: Secondary | ICD-10-CM

## 2018-02-15 DIAGNOSIS — E559 Vitamin D deficiency, unspecified: Secondary | ICD-10-CM

## 2018-02-15 LAB — VITAMIN D 25 HYDROXY (VIT D DEFICIENCY, FRACTURES): VIT D 25 HYDROXY: 17.6 ng/mL — AB (ref 30.0–100.0)

## 2018-02-15 LAB — VITAMIN B12: VITAMIN B 12: 311 pg/mL (ref 232–1245)

## 2018-02-15 MED ORDER — VITAMIN D (ERGOCALCIFEROL) 1.25 MG (50000 UNIT) PO CAPS: 50000.0000 [IU] | ORAL_CAPSULE | ORAL | 0 refills | Status: DC

## 2018-02-15 MED FILL — CLOBETASOL 0.05% SOLUTION: 0.05 | 25 days supply | Qty: 50 | Fill #0

## 2018-02-15 MED FILL — ?FAMOTIDINE 20 MG TABLET: 20 | 30 days supply | Qty: 60 | Fill #2

## 2018-02-15 NOTE — Telephone Encounter (Signed)
PC placed to patient today.  Left message informing her that prescription sent to our pharmacy for some clobetasol solution for her to use on her scalp twice a week.  Also recommended the [facility] as we had spoken about yesterday.

## 2018-02-23 ENCOUNTER — Telehealth: Payer: Self-pay | Admitting: Internal Medicine

## 2018-02-23 NOTE — Telephone Encounter (Signed)
Contacted to inform her that she is suppose to be taking the hctz and cozaar. Pt states she received a rx for norvasc I informed pt that she told Dr. Wynetta Emery that the norvasc caused discomfort in her legs so she stopped taking it. Pt states she understands and doesn't have any questions or concerns

## 2018-02-23 NOTE — Telephone Encounter (Signed)
Pt called to request medication management advice, Please give her a call when possible to discuss which BP medication she should be taking on a regular basis.  [phone]

## 2018-04-03 MED FILL — CLOBETASOL 0.05% SOLUTION: 0.05 | 25 days supply | Qty: 50 | Fill #1

## 2018-04-06 MED FILL — ?FAMOTIDINE 20 MG TABLET: 20 | 30 days supply | Qty: 60 | Fill #3

## 2018-04-06 MED FILL — HYDROCHLOROTHIAZIDE 25 MG T: 25 | 30 days supply | Qty: 30 | Fill #1

## 2018-04-06 MED FILL — VIT D2 1.25 MG (50,000 UNIT: 1.25 MG | 28 days supply | Qty: 4 | Fill #3

## 2018-04-17 ENCOUNTER — Ambulatory Visit: Payer: Self-pay | Admitting: Internal Medicine

## 2018-04-21 ENCOUNTER — Other Ambulatory Visit: Payer: Self-pay | Admitting: Obstetrics and Gynecology

## 2018-04-21 DIAGNOSIS — Z1231 Encounter for screening mammogram for malignant neoplasm of breast: Secondary | ICD-10-CM

## 2018-04-24 ENCOUNTER — Ambulatory Visit: Payer: Self-pay | Attending: Internal Medicine | Admitting: *Deleted

## 2018-04-24 DIAGNOSIS — Z111 Encounter for screening for respiratory tuberculosis: Secondary | ICD-10-CM | POA: Insufficient documentation

## 2018-04-24 NOTE — Progress Notes (Signed)
PPD Placement note Vernie Shanks, 47 y.o. female is here today for placement of PPD test Reason for PPD test: employment  Pt taken PPD test before: yes Verified in allergy area and with patient that they are not allergic to the products PPD is made of (Phenol or Tween). Yes Is patient taking any oral or IV steroid medication now or have they taken it in the last month? no Has the patient ever received the BCG vaccine?: no Has the patient been in recent contact with anyone known or suspected of having active TB disease?: no      O: Alert and oriented in NAD. P:  PPD placed on 04/24/2018.  Patient advised to return for reading within 48-72 hours.

## 2018-04-26 ENCOUNTER — Ambulatory Visit: Payer: Self-pay | Attending: Internal Medicine | Admitting: *Deleted

## 2018-04-26 DIAGNOSIS — Z111 Encounter for screening for respiratory tuberculosis: Secondary | ICD-10-CM

## 2018-04-26 LAB — TB SKIN TEST
Induration: 0 mm
TB Skin Test: NEGATIVE

## 2018-05-11 ENCOUNTER — Encounter (HOSPITAL_COMMUNITY): Payer: Self-pay

## 2018-05-11 ENCOUNTER — Ambulatory Visit
Admission: RE | Admit: 2018-05-11 | Discharge: 2018-05-11 | Disposition: A | Discharge status: Home / Self Care | Payer: No Typology Code available for payment source | Source: Ambulatory Visit | Attending: Obstetrics and Gynecology | Admitting: Obstetrics and Gynecology

## 2018-05-11 ENCOUNTER — Ambulatory Visit (HOSPITAL_COMMUNITY)
Admission: RE | Admit: 2018-05-11 | Discharge: 2018-05-11 | Disposition: A | Discharge status: Home / Self Care | Payer: Self-pay | Source: Ambulatory Visit | Attending: Obstetrics and Gynecology | Admitting: Obstetrics and Gynecology

## 2018-05-11 VITALS — BP 124/82 | Ht 67.0 in | Wt 282.8 lb

## 2018-05-11 DIAGNOSIS — Z1239 Encounter for other screening for malignant neoplasm of breast: Secondary | ICD-10-CM

## 2018-05-11 DIAGNOSIS — Z1231 Encounter for screening mammogram for malignant neoplasm of breast: Secondary | ICD-10-CM

## 2018-05-11 NOTE — Progress Notes (Signed)
No complaints today.   Pap Smear: Pap smear not completed today. Last Pap smear was 10/18/2017 in [facility] and normal with negative HPV. Patients prior Pap smear was 07/29/2014 and normal with negative HPV. 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 Patient has had four normal Pap smears completed since LEEP. Last four Pap smears, Colposcopy, and LEEP results are in EPIC.  Physical exam: Breasts Breasts symmetrical. No skin abnormalities bilateral breasts. No nipple retraction bilateral breasts. No nipple discharge bilateral breasts. No lymphadenopathy. No lumps palpated bilateral breasts. No complaints of pain or tenderness on exam. Referred patient to the [facility] for a screening mammogram. Appointment scheduled for Thursday, May 11, 2018 at 1610.        Pelvic/Bimanual No Pap smear completed today since last Pap smear and HPV typing was 10/18/2017. Pap smear not indicated per BCCCP guidelines.   Smoking History: Patient has never smoked.  Patient Navigation: Patient education provided. Access to services provided for patient through [facility] program.   Breast and Cervical Cancer Risk Assessment: Patient has no family history of breast cancer, known genetic mutations, or radiation treatment to the chest before age 67. Patient has a history of cervical dysplasia. Patient has no history of being immunocompromised or DES exposure in-utero. Patient has a 5-year risk for breast cancer at 0.7% and lifetime risk at 8.6%.

## 2018-05-11 NOTE — Patient Instructions (Signed)
Explained breast self awareness with Vernie Shanks. Patient did not need a Pap smear today due to last Pap smear and HPV typing was 10/18/2017. Let her know BCCCP will cover Pap smears and HPV typing every 5 years unless has a history of abnormal Pap smears. Referred patient to the [place] for a screening mammogram. Appointment scheduled for Thursday, May 11, 2018 at 1610. Let [facility] know the [facility] will follow up with her within the next couple weeks with results of mammogram by letter or phone. Tranquillity verbalized understanding.  Greyden Besecker, Arvil Chaco, RN 3:50 PM

## 2018-05-19 ENCOUNTER — Encounter: Payer: Self-pay | Admitting: Internal Medicine

## 2018-05-22 ENCOUNTER — Encounter (HOSPITAL_COMMUNITY): Payer: Self-pay

## 2018-05-25 ENCOUNTER — Ambulatory Visit: Payer: Self-pay | Attending: Internal Medicine | Admitting: Internal Medicine

## 2018-05-25 ENCOUNTER — Encounter: Payer: Self-pay | Admitting: Internal Medicine

## 2018-05-25 VITALS — BP 157/91 | HR 87 | Temp 98.1°F | Resp 16 | Wt 284.0 lb

## 2018-05-25 DIAGNOSIS — Z880 Allergy status to penicillin: Secondary | ICD-10-CM | POA: Insufficient documentation

## 2018-05-25 DIAGNOSIS — E559 Vitamin D deficiency, unspecified: Secondary | ICD-10-CM | POA: Insufficient documentation

## 2018-05-25 DIAGNOSIS — Z6841 Body Mass Index (BMI) 40.0 and over, adult: Secondary | ICD-10-CM | POA: Insufficient documentation

## 2018-05-25 DIAGNOSIS — F419 Anxiety disorder, unspecified: Secondary | ICD-10-CM | POA: Insufficient documentation

## 2018-05-25 DIAGNOSIS — Z79899 Other long term (current) drug therapy: Secondary | ICD-10-CM | POA: Insufficient documentation

## 2018-05-25 DIAGNOSIS — K219 Gastro-esophageal reflux disease without esophagitis: Secondary | ICD-10-CM | POA: Insufficient documentation

## 2018-05-25 DIAGNOSIS — L409 Psoriasis, unspecified: Secondary | ICD-10-CM | POA: Insufficient documentation

## 2018-05-25 DIAGNOSIS — L219 Seborrheic dermatitis, unspecified: Secondary | ICD-10-CM | POA: Insufficient documentation

## 2018-05-25 DIAGNOSIS — F329 Major depressive disorder, single episode, unspecified: Secondary | ICD-10-CM | POA: Insufficient documentation

## 2018-05-25 DIAGNOSIS — I10 Essential (primary) hypertension: Secondary | ICD-10-CM | POA: Insufficient documentation

## 2018-05-25 DIAGNOSIS — E785 Hyperlipidemia, unspecified: Secondary | ICD-10-CM | POA: Insufficient documentation

## 2018-05-25 MED ORDER — PRAVASTATIN SODIUM 40 MG PO TABS: 40.0000 mg | ORAL_TABLET | Freq: Every day | ORAL | 3 refills | Status: DC

## 2018-05-25 MED ORDER — LOSARTAN POTASSIUM 50 MG PO TABS
50.0000 mg | ORAL_TABLET | Freq: Every day | ORAL | 1 refills | Status: DC
Start: 2018-05-25 — End: 2019-02-19

## 2018-05-25 NOTE — Progress Notes (Signed)
Janet [name], female    DOB: 06-16-1971  MRN: [id number]  CC: Hypertension   Subjective: Janet [name] is a 47 y.o. female who presents for chronic ds management.  Last seen 3 months ago. Her concerns today include:  Pt with hx of HTN, HL, GERD, obesity, vi B12 def, vit D def, obesity, anx/dep, psoriasis   Seborrheic dermatitis of the scalp/psoriasis.  She has been using this clobetasol solution and over-the-counter dandruff shampoo.  It has helped a little.  Has an appointment with dermatology later this month  HTN: Cozaar added on last visit.  Reports compliance with this and HCTZ.  Not checking blood pressures often.  Limit salt in the foods.  No chest pains or shortness of breath.   HL:  Need RF on pravastatin.  Vit D/Vit B 12: Just completed weekly high-dose vitamin D.  She is taking the B12 supplement also.  Obesity:  Stress eater.  Since I last saw her father passed away.  She was pretty stressed during his illness.  Reports that her eating has gotten a little better since he has passed.  Still under a little stress now living with her mother.  Janet Active Problem List   Diagnosis Date Noted  . Psoriasis 02/14/2018  . Vitamin B 12 deficiency 02/14/2018  . Vitamin D deficiency 02/14/2018  . Anxiety and depression 02/03/2015  . Hyperlipemia 02/03/2015  . Hyperglycemia 02/03/2015  . Obesity 02/03/2015  . Anemia due to chronic blood loss - reports history of evaluation and told due to menorrhagia 05/30/2014  . 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 Essential hypertension 08/22/2007  . GERD 08/22/2007  . ABFND PAP SMEAR HGSIL 08/22/2007     Current Outpatient Medications on File Prior to Visit  Medication Sig Dispense Refill  . albuterol (PROVENTIL) (2.5 MG/3ML) 0.083% nebulizer solution Take 6 mLs (5 mg total) by nebulization every 4 (four) hours as needed for wheezing. 75 mL 0  . clobetasol (TEMOVATE) 0.05 % external solution Apply 1 applicator to scalp twice  a week 50 mL 3  . Clotrimazole 1 % OINT Apply 1 application topically 2 (two) times daily. (Janet not taking: Reported on 10/18/2017) 56.7 g 2  . famotidine (PEPCID) 20 MG tablet Take 1 tablet (20 mg total) by mouth 2 (two) times daily. 60 tablet 3  . hydrochlorothiazide (HYDRODIURIL) 25 MG tablet Take 1 tablet (25 mg total) by mouth daily. 90 tablet 1  . PARoxetine (PAXIL) 10 MG tablet Take 1/2 tab PO daily x 2 weeks then 1 tab daily. 30 tablet 1  . triamcinolone cream (KENALOG) 0.1 % Apply BID as needed to affected areas on extremities. 45 g 1  . vitamin B-12 (CYANOCOBALAMIN) 1000 MCG tablet Take 1 tablet (1,000 mcg total) by mouth daily. 100 tablet 1   No current facility-administered medications on file prior to visit.     Allergies  Allergen Reactions  . Lidocaine     ? Lidocaine allergy? Reports heart racing when has "shot of medicine in cevix" for leep  . Lisinopril-Hydrochlorothiazide     Leg cramps.  . Norvasc [Amlodipine Besylate]     Leg pain  . Penicillins Itching    Social History   Socioeconomic History  . Marital status: Single    Spouse name: Not on file  . Number of children: Not on file  . Years of education: Not on file  . Highest education level: Not on file  Occupational History  . Not on file  Social Needs  . Financial resource strain: Not on file  . Food insecurity:    Worry: Not on file    Inability: Not on file  . Transportation needs:    Medical: Not on file    Non-medical: Not on file  Tobacco Use  . Smoking status: Never Smoker  . Smokeless tobacco: Never Used  Substance and Sexual Activity  . Alcohol use: No  . Drug use: No  . Sexual activity: Never    Partners: Male    Birth control/protection: None  Lifestyle  . Physical activity:    Days per week: 5 days    Minutes per session: 60 min  . Stress: Very much  Relationships  . Social connections:    Talks on phone: More than three times a week    Gets together: More than three  times a week    Attends religious service: More than 4 times per year    Active member of club or organization: No    Attends meetings of clubs or organizations: Never    Relationship status: Divorced  . Intimate partner violence:    Fear of current or ex partner: No    Emotionally abused: No    Physically abused: No    Forced sexual activity: No  Other Topics Concern  . Not on file  Social History Narrative   Work or School: daycare in infant room      Home Situation: lives alone      Spiritual Beliefs: none      Lifestyle: no regular exercise; diet is poor             Family History  Problem Relation Age of Onset  . Diabetes Mother   . Hypertension Mother   . Fibroids Mother   . Heart disease Father   . Hypertension Father   . Heart attack Father   . Cancer Father        liver cancer  . Diabetes Brother   . Hypertension Brother   . Diabetes Brother     Past Surgical History:  Procedure Laterality Date  . LEEP  2009   CIN I negative margin  . SKIN CANCER EXCISION  2010   chest area    ROS: Review of Systems Negative except as stated above PHYSICAL EXAM: BP (!) 157/91   Pulse 87   Temp 98.1 F (36.7 C) (Oral)   Resp 16   Wt 284 lb (128.8 kg)   SpO2 97%   BMI 44.48 kg/m   Wt Readings from Last 3 Encounters:  05/25/18 284 lb (128.8 kg)  05/11/18 282 lb 12.8 oz (128.3 kg)  02/14/18 283 lb 4.8 oz (128.5 kg)    Physical Exam General appearance - alert, well appearing, and in no distress Mental status - normal mood, behavior, speech, dress, motor activity, and thought processes Chest - clear to auscultation, no wheezes, rales or rhonchi, symmetric air entry Heart - normal rate, regular rhythm, normal S1, S2, no murmurs, rubs, clicks or gallops Extremities -no lower extremity edema. Skin -still has flaky silvery patches on the scalp.  ASSESSMENT AND PLAN: 1. Essential hypertension Not at goal.  Increase Cozaar to 50 mg daily. - losartan (COZAAR)  50 MG tablet; Take 1 tablet (50 mg total) by mouth daily.  Dispense: 90 tablet; Refill: 1  2. Seborrheic dermatitis of scalp 3. Psoriasis Keep appointment with dermatologist.  4. Vitamin D deficiency Now that she is completed high-dose vitamin D, advised to take  vitamin D 1000 IU daily.  This can be purchased over-the-counter  5. Class 3 severe obesity due to excess calories without serious comorbidity with body mass index (BMI) of 45.0 to 49.9 in adult [facility]) -Encourage healthy eating habits and limiting portion sizes.  Encourage her to move more.  6. Hyperlipidemia, unspecified hyperlipidemia type - pravastatin (PRAVACHOL) 40 MG tablet; Take 1 tablet (40 mg total) by mouth daily.  Dispense: 90 tablet; Refill: 3  Janet was given the opportunity to ask questions.  Janet verbalized understanding of the plan and was able to repeat key elements of the plan.   No orders of the defined types were placed in this encounter.    Requested Prescriptions   Signed Prescriptions Disp Refills  . pravastatin (PRAVACHOL) 40 MG tablet 90 tablet 3    Sig: Take 1 tablet (40 mg total) by mouth daily.  Marland Kitchen losartan (COZAAR) 50 MG tablet 90 tablet 1    Sig: Take 1 tablet (50 mg total) by mouth daily.    Return in about 3 months (around 08/25/2018).  Karle Plumber, MD, FACP

## 2018-05-25 NOTE — Patient Instructions (Signed)
Increase Losartan to 50 mg daily.  Take Vitamin D 1000 IU daily.  Keep dermatology appointment.

## 2018-06-15 ENCOUNTER — Ambulatory Visit (INDEPENDENT_AMBULATORY_CARE_PROVIDER_SITE_OTHER): Payer: Self-pay | Admitting: Family Medicine

## 2018-06-15 ENCOUNTER — Other Ambulatory Visit: Payer: Self-pay

## 2018-06-15 DIAGNOSIS — L219 Seborrheic dermatitis, unspecified: Secondary | ICD-10-CM | POA: Insufficient documentation

## 2018-06-15 MED ORDER — FLUOCINONIDE 0.05 % EX SOLN: 1.0000 "application " | Freq: Two times a day (BID) | CUTANEOUS | 0 refills | Status: AC

## 2018-06-15 MED ORDER — KETOCONAZOLE 1 % EX SHAM: MEDICATED_SHAMPOO | CUTANEOUS | 0 refills | Status: DC

## 2018-06-15 NOTE — Patient Instructions (Signed)
[place], it was a pleasure to see you today. We will be treating you with two new creams for your scalp. The first is an antifungal known as ketoconazole. The other is a topical steroid. Please use as prescribed and return to clinic in 4 weeks if you are not having any improvement.   Seborrheic Dermatitis, Adult Seborrheic dermatitis is a skin disease that causes red, scaly patches. It usually occurs on the scalp, and it is often called dandruff. The patches may appear on other parts of the body. Skin patches tend to appear where there are many oil glands in the skin. Areas of the body that are commonly affected include:  Scalp.  Skin folds of the body.  Ears.  Eyebrows.  Neck.  Face.  Armpits.  The bearded area of men's faces.  The condition may come and go for no known reason, and it is often long-lasting (chronic). What are the causes? The cause of this condition is not known. What increases the risk? This condition is more likely to develop in people who:  Have certain conditions, such as: ? HIV (human immunodeficiency virus). ? AIDS (acquired immunodeficiency syndrome). ? Parkinson disease. ? Mood disorders, such as depression.  Are 27-59 years old.  What are the signs or symptoms? Symptoms of this condition include:  Thick scales on the scalp.  Redness on the face or in the armpits.  Skin that is flaky. The flakes may be white or yellow.  Skin that seems oily or dry but is not helped with moisturizers.  Itching or burning in the affected areas.  How is this diagnosed? This condition is diagnosed with a medical history and physical exam. A sample of your skin may be tested (skin biopsy). You may need to see a skin specialist (dermatologist). How is this treated? There is no cure for this condition, but treatment can help to manage the symptoms. You may get treatment to remove scales, lower the risk of skin infection, and reduce swelling or itching.  Treatment may include:  Creams that reduce swelling and irritation (steroids).  Creams that reduce skin yeast.  Medicated shampoo, soaps, moisturizing creams, or ointments.  Medicated moisturizing creams or ointments.  Follow these instructions at home:  Apply over-the-counter and prescription medicines only as told by your health care provider.  Use any medicated shampoo, soaps, skin creams, or ointments only as told by your health care provider.  Keep all follow-up visits as told by your health care provider. This is important. Contact a health care provider if:  Your symptoms do not improve with treatment.  Your symptoms get worse.  You have new symptoms. This information is not intended to replace advice given to you by your health care provider. Make sure you discuss any questions you have with your health care provider. Document Released: 11/22/2005 Document Revised: 06/11/2016 Document Reviewed: 03/11/2016 Elsevier Interactive Patient Education  [employer].

## 2018-06-15 NOTE — Assessment & Plan Note (Signed)
Severe. Failed Clobetasol solution. D/C Clobetasol. Start Fluocinonide solution BID for 2 weeks. Start Ketoconazole Shampoo twice weekly for 8 weeks and then PRN. May obtain refills from PCP. Derm/PCP f/u in 4 weeks or sooner if symptoms worsens.

## 2018-06-15 NOTE — Progress Notes (Signed)
  Subjective:     Patient ID: Janet [name], female   DOB: 1971/10/20, 47 y.o.   MRN: [id number]  Rash  This is a chronic problem. The current episode started more than 1 year ago (Started years ago). The problem has been gradually worsening since onset. The affected locations include the scalp. The rash is characterized by dryness, itchiness and scaling. She was exposed to nothing. Pertinent negatives include no fever. Past treatments include topical steroids (Topical Clobetasol solution). The treatment provided mild relief. There is no history of eczema.   Current Outpatient Medications on File Prior to Visit  Medication Sig Dispense Refill  . albuterol (PROVENTIL) (2.5 MG/3ML) 0.083% nebulizer solution Take 6 mLs (5 mg total) by nebulization every 4 (four) hours as needed for wheezing. 75 mL 0  . clobetasol (TEMOVATE) 0.05 % external solution Apply 1 applicator to scalp twice a week 50 mL 3  . Clotrimazole 1 % OINT Apply 1 application topically 2 (two) times daily. (Patient not taking: Reported on 10/18/2017) 56.7 g 2  . famotidine (PEPCID) 20 MG tablet Take 1 tablet (20 mg total) by mouth 2 (two) times daily. 60 tablet 3  . hydrochlorothiazide (HYDRODIURIL) 25 MG tablet Take 1 tablet (25 mg total) by mouth daily. 90 tablet 1  . losartan (COZAAR) 50 MG tablet Take 1 tablet (50 mg total) by mouth daily. 90 tablet 1  . PARoxetine (PAXIL) 10 MG tablet Take 1/2 tab PO daily x 2 weeks then 1 tab daily. 30 tablet 1  . pravastatin (PRAVACHOL) 40 MG tablet Take 1 tablet (40 mg total) by mouth daily. 90 tablet 3  . triamcinolone cream (KENALOG) 0.1 % Apply BID as needed to affected areas on extremities. 45 g 1  . vitamin B-12 (CYANOCOBALAMIN) 1000 MCG tablet Take 1 tablet (1,000 mcg total) by mouth daily. 100 tablet 1   No current facility-administered medications on file prior to visit.    Past Medical History:  Diagnosis Date  . ABFND PAP SMEAR HGSIL 08/22/2007  . Anemia due to chronic blood loss  - reports history of evaluation and told due to menorrhagia 05/30/2014  . ANXIETY 08/22/2007  . Anxiety and depression   . Frequent headaches   . GERD 08/22/2007  . Hyperglycemia   . Hyperlipemia   . HYPERTENSION, BENIGN ESSENTIAL 08/22/2007  . 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 Obesity 02/03/2015  . PLANTAR FASCIITIS, RIGHT 07/01/2008  . PSORIASIS 08/22/2007     Review of Systems  Constitutional: Negative for fever.  Respiratory: Negative.   Cardiovascular: Negative.   Gastrointestinal: Negative.   Skin: Positive for rash.  All other systems reviewed and are negative.      Objective:   Physical Exam  Constitutional: She appears well-developed. No distress.  HENT:  Head: Normocephalic. Hair is normal.    Nursing note reviewed.      Assessment:     Severe scalp seborrheic dermatitis    Plan:     Check problem list.

## 2018-06-16 MED FILL — FLUOCINONIDE 0.05 % SOLN: 0.05 | 30 days supply | Qty: 60 | Fill #0

## 2018-06-26 ENCOUNTER — Other Ambulatory Visit: Payer: Self-pay

## 2018-06-26 MED ORDER — FAMOTIDINE 20 MG PO TABS: 20.0000 mg | ORAL_TABLET | Freq: Two times a day (BID) | ORAL | 2 refills | Status: DC

## 2018-08-16 MED FILL — ?FAMOTIDINE 20 MG TABLET: 20 | 30 days supply | Qty: 60 | Fill #0

## 2018-08-16 MED FILL — LOSARTAN POTASSIUM 50 MG TA: 50 | 30 days supply | Qty: 30 | Fill #0

## 2018-08-16 MED FILL — HYDROCHLOROTHIAZIDE 25 MG T: 25 | 30 days supply | Qty: 30 | Fill #2

## 2018-08-16 MED FILL — ?PRAVASTATIN NA 40 MG TAB: 40 | 30 days supply | Qty: 30 | Fill #0

## 2018-08-25 ENCOUNTER — Ambulatory Visit: Payer: No Typology Code available for payment source | Admitting: Internal Medicine

## 2018-09-08 ENCOUNTER — Ambulatory Visit: Payer: Self-pay | Attending: Internal Medicine | Admitting: Internal Medicine

## 2018-09-08 ENCOUNTER — Other Ambulatory Visit: Payer: Self-pay

## 2018-09-08 VITALS — BP 145/83 | HR 88 | Temp 97.2°F | Resp 16 | Ht 66.0 in | Wt 284.6 lb

## 2018-09-08 DIAGNOSIS — E559 Vitamin D deficiency, unspecified: Secondary | ICD-10-CM | POA: Insufficient documentation

## 2018-09-08 DIAGNOSIS — I10 Essential (primary) hypertension: Secondary | ICD-10-CM | POA: Insufficient documentation

## 2018-09-08 DIAGNOSIS — Z6841 Body Mass Index (BMI) 40.0 and over, adult: Secondary | ICD-10-CM | POA: Insufficient documentation

## 2018-09-08 DIAGNOSIS — Z85828 Personal history of other malignant neoplasm of skin: Secondary | ICD-10-CM | POA: Insufficient documentation

## 2018-09-08 DIAGNOSIS — Z79899 Other long term (current) drug therapy: Secondary | ICD-10-CM | POA: Insufficient documentation

## 2018-09-08 DIAGNOSIS — E538 Deficiency of other specified B group vitamins: Secondary | ICD-10-CM | POA: Insufficient documentation

## 2018-09-08 DIAGNOSIS — E785 Hyperlipidemia, unspecified: Secondary | ICD-10-CM | POA: Insufficient documentation

## 2018-09-08 DIAGNOSIS — F419 Anxiety disorder, unspecified: Secondary | ICD-10-CM | POA: Insufficient documentation

## 2018-09-08 DIAGNOSIS — Z880 Allergy status to penicillin: Secondary | ICD-10-CM | POA: Insufficient documentation

## 2018-09-08 DIAGNOSIS — F329 Major depressive disorder, single episode, unspecified: Secondary | ICD-10-CM | POA: Insufficient documentation

## 2018-09-08 DIAGNOSIS — R0683 Snoring: Secondary | ICD-10-CM | POA: Insufficient documentation

## 2018-09-08 DIAGNOSIS — K219 Gastro-esophageal reflux disease without esophagitis: Secondary | ICD-10-CM | POA: Insufficient documentation

## 2018-09-08 NOTE — Patient Instructions (Signed)
Follow a Healthy Eating Plan - You can do it! Limit sugary drinks.  Avoid sodas, sweet tea, sport or energy drinks, or fruit drinks.  Drink water, lo-fat milk, or diet drinks. Limit snack foods.   Cut back on candy, cake, cookies, chips, ice cream.  These are a special treat, only in small amounts. Eat plenty of vegetables.  Especially dark green, red, and orange vegetables. Aim for at least 3 servings a day. More is better! Include fruit in your daily diet.  Whole fruit is much healthier than fruit juice! Limit "white" bread, "white" pasta, "white" rice.   Choose "100% whole grain" products, brown or wild rice. Avoid fatty meats. Try "Meatless Monday" and choose eggs or beans one day a week.  When eating meat, choose lean meats like chicken, [place], and fish.  Grill, broil, or bake meats instead of frying, and eat poultry without the skin. Eat less salt.  Avoid frozen pizzas, frozen dinners and salty foods.  Use seasonings other than salt in cooking.  This can help blood pressure and keep you from swelling Beer, wine and liquor have calories.  If you can safely drink alcohol, limit to 1 drink per day for women, 2 drinks for men.  Try to get in some form of regular aerobic exercise at least 3 days a week for 30 minutes.

## 2018-09-08 NOTE — Progress Notes (Signed)
Patient ID: Janet [name], female    DOB: 22-Nov-1971  MRN: [id number]  CC: Follow-up (HTN)   Subjective: Janet [name] is a 47 y.o. female who presents for chronic disease management Her concerns today include:  Pt with hx of HTN, HL, GERD, obesity, vit B12 def, vit D def, obesity, anx/dep, psoriasis.  HTN: compliant with meds but did not take as yet for the day.  Limits salt in foods No device to check BP.  Denies any chest pains or shortness of breath.  Obesity:  "My problem is Sprite and sweet tea."  Plans to start walking around her daughter's apartment complex in the evenings.  She has to be there anyway to babysit her grandchildren Daughter and mom tell her that she snores very loud. Endorses day time sleepiness, morning headaches and not feeling rest in a.m.  No problems falling asleep behind the wheel.  Vit D/Vit B12 def: Taking a multivitamin that has vitamin B12 1000 MCG's in it.  She also takes vitamin D 1000 IU daily.  Would like to have levels rechecked today if blood work is going to be done.  She has seen the dermatologist for the dermatitis on her scalp.  She was prescribed  a scalp solution and prescription shampoo neither of which have helped.  She has a follow-up appointment with them.  She thinks they plan to do a biopsy.  Patient Active Problem List   Diagnosis Date Noted  . Seborrheic dermatitis of scalp 06/15/2018  . Psoriasis 02/14/2018  . Vitamin B 12 deficiency 02/14/2018  . Vitamin D deficiency 02/14/2018  . Anxiety and depression 02/03/2015  . Hyperlipemia 02/03/2015  . Obesity 02/03/2015  . Anemia due to chronic blood loss - reports history of evaluation and told due to menorrhagia 05/30/2014  . 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 Essential hypertension 08/22/2007  . GERD 08/22/2007  . ABFND PAP SMEAR HGSIL 08/22/2007     Current Outpatient Medications on File Prior to Visit  Medication Sig Dispense Refill  . clobetasol (TEMOVATE) 0.05 % external  solution Apply 1 applicator to scalp twice a week 50 mL 3  . Clotrimazole 1 % OINT Apply 1 application topically 2 (two) times daily. 56.7 g 2  . famotidine (PEPCID) 20 MG tablet Take 1 tablet (20 mg total) by mouth 2 (two) times daily. 60 tablet 2  . hydrochlorothiazide (HYDRODIURIL) 25 MG tablet Take 1 tablet (25 mg total) by mouth daily. 90 tablet 1  . KETOCONAZOLE, TOPICAL, 1 % SHAM Apply to the scalp twice weekly for 8 weeks and then as needed 200 mL 0  . losartan (COZAAR) 50 MG tablet Take 1 tablet (50 mg total) by mouth daily. 90 tablet 1  . PARoxetine (PAXIL) 10 MG tablet Take 1/2 tab PO daily x 2 weeks then 1 tab daily. 30 tablet 1  . pravastatin (PRAVACHOL) 40 MG tablet Take 1 tablet (40 mg total) by mouth daily. 90 tablet 3  . triamcinolone cream (KENALOG) 0.1 % Apply BID as needed to affected areas on extremities. 45 g 1  . vitamin B-12 (CYANOCOBALAMIN) 1000 MCG tablet Take 1 tablet (1,000 mcg total) by mouth daily. 100 tablet 1  . albuterol (PROVENTIL) (2.5 MG/3ML) 0.083% nebulizer solution Take 6 mLs (5 mg total) by nebulization every 4 (four) hours as needed for wheezing. (Patient not taking: Reported on 09/08/2018) 75 mL 0   No current facility-administered medications on file prior to visit.     Allergies  Allergen Reactions  .  Lidocaine     ? Lidocaine allergy? Reports heart racing when has "shot of medicine in cevix" for leep  . Lisinopril-Hydrochlorothiazide     Leg cramps.  . Norvasc [Amlodipine Besylate]     Leg pain  . Penicillins Itching    Social History   Socioeconomic History  . Marital status: Single    Spouse name: Not on file  . Number of children: Not on file  . Years of education: Not on file  . Highest education level: Not on file  Occupational History  . Not on file  Social Needs  . Financial resource strain: Not on file  . Food insecurity:    Worry: Not on file    Inability: Not on file  . Transportation needs:    Medical: Not on file     Non-medical: Not on file  Tobacco Use  . Smoking status: Never Smoker  . Smokeless tobacco: Never Used  Substance and Sexual Activity  . Alcohol use: No  . Drug use: No  . Sexual activity: Never    Partners: Male    Birth control/protection: None  Lifestyle  . Physical activity:    Days per week: 5 days    Minutes per session: 60 min  . Stress: Very much  Relationships  . Social connections:    Talks on phone: More than three times a week    Gets together: More than three times a week    Attends religious service: More than 4 times per year    Active member of club or organization: No    Attends meetings of clubs or organizations: Never    Relationship status: Divorced  . Intimate partner violence:    Fear of current or ex partner: No    Emotionally abused: No    Physically abused: No    Forced sexual activity: No  Other Topics Concern  . Not on file  Social History Narrative   Work or School: daycare in infant room      Home Situation: lives alone      Spiritual Beliefs: none      Lifestyle: no regular exercise; diet is poor             Family History  Problem Relation Age of Onset  . Diabetes Mother   . Hypertension Mother   . Fibroids Mother   . Heart disease Father   . Hypertension Father   . Heart attack Father   . Cancer Father        liver cancer  . Diabetes Brother   . Hypertension Brother   . Diabetes Brother     Past Surgical History:  Procedure Laterality Date  . LEEP  2009   CIN I negative margin  . SKIN CANCER EXCISION  2010   chest area    ROS: Review of Systems Negative except as above. PHYSICAL EXAM: BP (!) 145/83   Pulse 88   Temp (!) 97.2 F (36.2 C) (Oral)   Resp 16   Ht 5\' 6"  (1.676 m)   Wt 284 lb 9.6 oz (129.1 kg)   LMP 08/14/2018   SpO2 98%   BMI 45.94 kg/m   Wt Readings from Last 3 Encounters:  09/08/18 284 lb 9.6 oz (129.1 kg)  06/15/18 283 lb (128.4 kg)  05/25/18 284 lb (128.8 kg)    Physical  Exam  General appearance - alert, well appearing, obese female and in no distress Mental status - normal mood, behavior, speech, dress,  motor activity, and thought processes Neck - supple, no significant adenopathy Chest - clear to auscultation, no wheezes, rales or rhonchi, symmetric air entry Heart - normal rate, regular rhythm, normal S1, S2, no murmurs, rubs, clicks or gallops Extremities - peripheral pulses normal, no pedal edema, no clubbing or cyanosis    ASSESSMENT AND PLAN: 1. Essential hypertension Not at goal.  Patient has not taken her medication as yet for today.  Advised her to take her medicines as soon as she returns home and to take daily as prescribed.  Advised to limit salt in the foods. - CBC - Lipid panel - Comprehensive metabolic panel  2. Class 3 severe obesity due to excess calories without serious comorbidity with body mass index (BMI) of 45.0 to 49.9 in adult [facility]) Healthy eating habits discussed.  She has set a goal to stop drinking regular sodas and sweet tea.  Encourage some form of aerobic activity at least 3 days a week for 30 minutes.  She plans to start doing so.  Encouraged her to set weight loss goal. - Hemoglobin A1c  3. Loud snoring History highly suggestive of OSA.  Will refer for sleep study. - PSG Sleep Study; Future  4. Vitamin B 12 deficiency - Vitamin B12  5. Vitamin D deficiency - VITAMIN D 25 Hydroxy (Vit-D Deficiency, Fractures)  Patient was given the opportunity to ask questions.  Patient verbalized understanding of the plan and was able to repeat key elements of the plan.   Orders Placed This Encounter  Procedures  . CBC  . Lipid panel  . Comprehensive metabolic panel  . Vitamin B12  . VITAMIN D 25 Hydroxy (Vit-D Deficiency, Fractures)  . Hemoglobin A1c  . PSG Sleep Study     Requested Prescriptions    No prescriptions requested or ordered in this encounter    Return in about 3 months (around 12/09/2018).  Karle Plumber, MD, FACP

## 2018-09-09 LAB — COMPREHENSIVE METABOLIC PANEL
A/G RATIO: 1.5 (ref 1.2–2.2)
ALBUMIN: 4.1 g/dL (ref 3.5–5.5)
ALT: 24 IU/L (ref 0–32)
AST: 23 IU/L (ref 0–40)
Alkaline Phosphatase: 69 IU/L (ref 39–117)
BUN / CREAT RATIO: 14 (ref 9–23)
BUN: 10 mg/dL (ref 6–24)
CALCIUM: 9.1 mg/dL (ref 8.7–10.2)
CHLORIDE: 103 mmol/L (ref 96–106)
CO2: 21 mmol/L (ref 20–29)
Creatinine, Ser: 0.73 mg/dL (ref 0.57–1.00)
GFR, EST AFRICAN AMERICAN: 115 mL/min/{1.73_m2} (ref >59)
GFR, EST NON AFRICAN AMERICAN: 100 mL/min/{1.73_m2} (ref >59)
GLUCOSE: 150 mg/dL — AB (ref 65–99)
Globulin, Total: 2.7 g/dL (ref 1.5–4.5)
Potassium: 3.8 mmol/L (ref 3.5–5.2)
Sodium: 142 mmol/L (ref 134–144)
TOTAL PROTEIN: 6.8 g/dL (ref 6.0–8.5)

## 2018-09-09 LAB — LIPID PANEL
CHOLESTEROL TOTAL: 245 mg/dL — AB (ref 100–199)
Chol/HDL Ratio: 4.2 ratio (ref 0.0–4.4)
HDL: 58 mg/dL (ref >39)
LDL Calculated: 149 mg/dL — ABNORMAL HIGH (ref 0–99)
Triglycerides: 188 mg/dL — ABNORMAL HIGH (ref 0–149)
VLDL Cholesterol Cal: 38 mg/dL (ref 5–40)

## 2018-09-09 LAB — HEMOGLOBIN A1C
ESTIMATED AVERAGE GLUCOSE: 126 mg/dL
Hgb A1c MFr Bld: 6 % — ABNORMAL HIGH (ref 4.8–5.6)

## 2018-09-09 LAB — VITAMIN B12: Vitamin B-12: 293 pg/mL (ref 232–1245)

## 2018-09-09 LAB — CBC
Hematocrit: 38.3 % (ref 34.0–46.6)
Hemoglobin: 12.9 g/dL (ref 11.1–15.9)
MCH: 30.7 pg (ref 26.6–33.0)
MCHC: 33.7 g/dL (ref 31.5–35.7)
MCV: 91 fL (ref 79–97)
PLATELETS: 301 10*3/uL (ref 150–450)
RBC: 4.2 x10E6/uL (ref 3.77–5.28)
RDW: 13.2 % (ref 12.3–15.4)
WBC: 6.5 10*3/uL (ref 3.4–10.8)

## 2018-09-09 LAB — VITAMIN D 25 HYDROXY (VIT D DEFICIENCY, FRACTURES): Vit D, 25-Hydroxy: 15.5 ng/mL — ABNORMAL LOW (ref 30.0–100.0)

## 2018-09-10 ENCOUNTER — Other Ambulatory Visit: Payer: Self-pay | Admitting: Internal Medicine

## 2018-09-10 DIAGNOSIS — E539 Vitamin B deficiency, unspecified: Secondary | ICD-10-CM

## 2018-09-10 MED ORDER — METFORMIN HCL 500 MG PO TABS: 500.0000 mg | ORAL_TABLET | Freq: Every day | ORAL | 3 refills | Status: DC

## 2018-09-10 MED ORDER — VITAMIN B-12 1000 MCG PO TABS: 1000.0000 ug | ORAL_TABLET | Freq: Every day | ORAL | 1 refills | Status: DC

## 2018-09-11 MED FILL — metFORMIN HCL 500 MG TABS: 500 | 30 days supply | Qty: 30 | Fill #0

## 2018-09-14 ENCOUNTER — Ambulatory Visit (INDEPENDENT_AMBULATORY_CARE_PROVIDER_SITE_OTHER): Payer: Self-pay | Admitting: Family Medicine

## 2018-09-14 ENCOUNTER — Other Ambulatory Visit: Payer: Self-pay

## 2018-09-14 DIAGNOSIS — L219 Seborrheic dermatitis, unspecified: Secondary | ICD-10-CM

## 2018-09-14 NOTE — Assessment & Plan Note (Signed)
Unsure if this seborrhea vs psoriasis.  Since patient seemed to do better with tar shampoo and would like to use that she will try again.

## 2018-09-14 NOTE — Patient Instructions (Signed)
Good to see you today!  Thanks for coming in.  Stop all the other scalp medications  Use OTC Tar shampoo daily for several weeks until it is calmed down You can scrub the scalp to remove scale  Once under control use the T shampoo several times a week and in between use S[place]un blue or Head and Shoulders   If it becomes very red or very sore then let us

## 2018-09-14 NOTE — Progress Notes (Signed)
Subjective  Janet [name] is a 47 y.o. female is presenting with the following  Follow up for scalp psoriasis.  Has been using previously prescribed lotions but ran out.  The burned when applied but seems to help some but the scaling worsened again.  Patient was hoping they would cure her disease. She feels that the OTC Tgel tar shampoo she has used in past helped and would like to use that again since there is no cure No fever or discharge but scalp is very itchy   Chief Complaint noted Review of Symptoms - see HPI PMH - Smoking status noted.    Objective Vital Signs reviewed BP (!) 142/92   Pulse 85   Temp 98.4 F (36.9 C) (Oral)   Ht 5\' 7"  (1.702 m)   Wt 279 lb 3.2 oz (126.6 kg)   SpO2 95%   BMI 43.73 kg/m   Diffuse moderately thick silvery scale over scalp and in ears Scattered small areas of dry skin on extrensor surfaces  Assessments/Plans  See after visit summary for details of patient instuctions  Seborrheic dermatitis of scalp Unsure if this seborrhea vs psoriasis.  Since patient seemed to do better with tar shampoo and would like to use that she will try again.

## 2018-09-15 ENCOUNTER — Telehealth: Payer: Self-pay | Admitting: Internal Medicine

## 2018-09-15 NOTE — Telephone Encounter (Signed)
Patient called because she would like more information regarding her medication list due to her last visit at the dermatologist office. As well as the results from her Blood tests. Please follow up with patient. Patient would like to be reached at her work number.

## 2018-09-15 NOTE — Telephone Encounter (Signed)
Pt had a question about metformin because she didn't receive a call regarding lab results. Went over blood work with pt

## 2018-11-14 ENCOUNTER — Telehealth: Payer: Self-pay | Admitting: Internal Medicine

## 2018-11-14 NOTE — Telephone Encounter (Signed)
Will forward to pcp

## 2018-11-14 NOTE — Telephone Encounter (Signed)
Pt called to say she has diarrhea with METFORMIN. Pt request a different medication please. Call to [facility]. Pt ph  [phone].

## 2018-11-16 NOTE — Telephone Encounter (Signed)
The patient claims that she started by taking 1/2 tablet of metformin to begin with and that most people in her family have diabetes and are not able to take metformin. I mentioned that diet and exercise are very important in managing her DM but she was more focused on trying to switch to a different medication. The patient did say she would see a nutritionist but again stressed that she wanted a different medication and asked that I tell the doctor she said "pretty please"  I explained that metformin is typically the first step in treating DM and a change in medication would be at the discretion of her doctor. Please advise if a change is appropriate and place referral to the nutritionist.

## 2018-11-16 NOTE — Telephone Encounter (Signed)
Could you follow up with pt

## 2018-11-17 NOTE — Telephone Encounter (Signed)
Contacted pt and went over Dr. Wynetta Emery message pt states she will focus on diet and exercise

## 2018-12-15 ENCOUNTER — Ambulatory Visit: Payer: Self-pay | Admitting: Internal Medicine

## 2019-01-09 ENCOUNTER — Other Ambulatory Visit: Payer: Self-pay | Admitting: Physician Assistant

## 2019-01-09 ENCOUNTER — Encounter (HOSPITAL_BASED_OUTPATIENT_CLINIC_OR_DEPARTMENT_OTHER): Payer: Self-pay

## 2019-01-09 DIAGNOSIS — I10 Essential (primary) hypertension: Secondary | ICD-10-CM

## 2019-01-09 MED FILL — ?PRAVASTATIN NA 40 MG TAB: 40 | 30 days supply | Qty: 30 | Fill #1

## 2019-01-09 MED FILL — FAMOTIDINE 20 MG TABLET: 20 | 30 days supply | Qty: 60 | Fill #1

## 2019-01-09 MED FILL — HYDROCHLOROTHIAZIDE 25 MG T: 25 | 30 days supply | Qty: 30 | Fill #0

## 2019-01-09 MED FILL — PARoxetine HCL 10 MG TABS: 10 | 30 days supply | Qty: 30 | Fill #1

## 2019-01-15 ENCOUNTER — Ambulatory Visit (HOSPITAL_BASED_OUTPATIENT_CLINIC_OR_DEPARTMENT_OTHER): Payer: Self-pay | Attending: Internal Medicine | Admitting: Internal Medicine

## 2019-01-15 VITALS — Ht 66.0 in | Wt 280.0 lb

## 2019-01-15 DIAGNOSIS — G4733 Obstructive sleep apnea (adult) (pediatric): Secondary | ICD-10-CM | POA: Insufficient documentation

## 2019-01-15 DIAGNOSIS — R0683 Snoring: Secondary | ICD-10-CM

## 2019-01-23 ENCOUNTER — Ambulatory Visit: Payer: Self-pay | Admitting: Internal Medicine

## 2019-01-27 DIAGNOSIS — R0683 Snoring: Secondary | ICD-10-CM

## 2019-01-27 NOTE — Procedures (Signed)
    Patient Name: Janet [name], Janet [name] Date: 01/15/2019 Gender: Female D.O.B: 07-01-1971 Age (years): 41 Referring Provider: Karle Plumber MD Height (inches): 66 Interpreting Physician: Baird Lyons MD, ABSM Weight (lbs): 280 RPSGT: Rebekah [place] BMI: 45 MRN: [id number] Neck Size: 17.50  CLINICAL INFORMATION Sleep Study Type: NPSG Indication for sleep study: Daytime Fatigue, Fatigue, Hypertension, Morning Headaches, Obesity, Snoring Epworth Sleepiness Score: 15  SLEEP STUDY TECHNIQUE As per the AASM Manual for the Scoring of Sleep and Associated Events v2.3 (April 2016) with a hypopnea requiring 4% desaturations.  The channels recorded and monitored were frontal, central and occipital EEG, electrooculogram (EOG), submentalis EMG (chin), nasal and oral airflow, thoracic and abdominal wall motion, anterior tibialis EMG, snore microphone, electrocardiogram, and pulse oximetry.  MEDICATIONS Medications self-administered by patient taken the night of the study : none reported  SLEEP ARCHITECTURE The study was initiated at 10:17:32 PM and ended at 4:45:27 AM.  Sleep onset time was 2.5 minutes and the sleep efficiency was 79.6%. The total sleep time was 308.9 minutes.  Stage REM latency was 133.0 minutes.  The patient spent 29.27% of the night in stage N1 sleep, 54.22% in stage N2 sleep, 8.58% in stage N3 and 7.9% in REM.  Alpha intrusion was absent.  Supine sleep was 96.12%.  RESPIRATORY PARAMETERS The overall apnea/hypopnea index (AHI) was 52.8 per hour. There were 0 total apneas, including 0 obstructive, 0 central and 0 mixed apneas. There were 272 hypopneas and 26 RERAs.  The AHI during Stage REM sleep was 78.4 per hour.  AHI while supine was 51.1 per hour.  The mean oxygen saturation was 91.20%. The minimum SpO2 during sleep was 65.00%.  loud snoring was noted during this study.  CARDIAC DATA The 2 lead EKG demonstrated sinus rhythm. The mean heart rate was  76.67 beats per minute. Other EKG findings include:PVCs.  LEG MOVEMENT DATA The total PLMS were 0 with a resulting PLMS index of 0.00. Associated arousal with leg movement index was 0.0 .  IMPRESSIONS - Severe obstructive sleep apnea occurred during this study (AHI = 52.8/h). - No significant central sleep apnea occurred during this study (CAI = 0.0/h). - Oxygen desaturation was noted during this study (Min O2 = 65.00%). Mean sat 91.2%. - The patient snored with loud snoring volume. - No cardiac abnormalities were noted during this study. - Clinically significant periodic limb movements did not occur during sleep. No significant associated arousals.  DIAGNOSIS - Obstructive Sleep Apnea (327.23 [G47.33 ICD-10])  RECOMMENDATIONS - Suggest CPAP titration sleep study or autopap. Other options would be based on clinical judgment. - Be careful with alcohol, sedatives and other CNS depressants that may worsen sleep apnea and disrupt normal sleep architecture. - Sleep hygiene should be reviewed to assess factors that may improve sleep quality. - Weight management and regular exercise should be initiated or continued if appropriate.  [Electronically signed] 01/27/2019 11:59 AM  Baird Lyons MD, ABSM Diplomate, American Board of Sleep Medicine   NPI: [id number]                         [place], [place] [facility]  ELECTRONICALLY SIGNED ON:  01/27/2019, 11:57 AM [place] PH: [phone]   FX: [phone] [place]

## 2019-01-28 ENCOUNTER — Other Ambulatory Visit: Payer: Self-pay | Admitting: Internal Medicine

## 2019-01-28 ENCOUNTER — Encounter: Payer: Self-pay | Admitting: Internal Medicine

## 2019-01-28 DIAGNOSIS — G4733 Obstructive sleep apnea (adult) (pediatric): Secondary | ICD-10-CM | POA: Insufficient documentation

## 2019-02-05 ENCOUNTER — Telehealth: Payer: Self-pay

## 2019-02-05 NOTE — Telephone Encounter (Signed)
Contacted pt to go over sleep study results pt is aware and doesn't have any questions or concerns  

## 2019-02-11 ENCOUNTER — Ambulatory Visit (HOSPITAL_BASED_OUTPATIENT_CLINIC_OR_DEPARTMENT_OTHER): Payer: Self-pay | Attending: Internal Medicine | Admitting: Internal Medicine

## 2019-02-11 VITALS — Ht 66.0 in | Wt 280.0 lb

## 2019-02-11 DIAGNOSIS — G4733 Obstructive sleep apnea (adult) (pediatric): Secondary | ICD-10-CM | POA: Insufficient documentation

## 2019-02-14 ENCOUNTER — Telehealth (HOSPITAL_COMMUNITY): Payer: Self-pay

## 2019-02-14 NOTE — Telephone Encounter (Signed)
Left message with patient stating that I was returning her phone call that she had left [place]. Left name and number for her to call back.

## 2019-02-16 ENCOUNTER — Telehealth (HOSPITAL_COMMUNITY): Payer: Self-pay

## 2019-02-16 NOTE — Telephone Encounter (Signed)
Left message with patient about returning patient's phone call. Left name and number for her to call back.

## 2019-02-19 ENCOUNTER — Other Ambulatory Visit: Payer: Self-pay | Admitting: Internal Medicine

## 2019-02-19 ENCOUNTER — Encounter: Payer: Self-pay | Admitting: Internal Medicine

## 2019-02-19 ENCOUNTER — Other Ambulatory Visit: Payer: Self-pay

## 2019-02-19 ENCOUNTER — Ambulatory Visit: Payer: Self-pay | Attending: Internal Medicine | Admitting: Internal Medicine

## 2019-02-19 VITALS — BP 155/96 | HR 85 | Temp 98.2°F | Resp 16 | Wt 294.8 lb

## 2019-02-19 DIAGNOSIS — E538 Deficiency of other specified B group vitamins: Secondary | ICD-10-CM

## 2019-02-19 DIAGNOSIS — G4733 Obstructive sleep apnea (adult) (pediatric): Secondary | ICD-10-CM

## 2019-02-19 DIAGNOSIS — I10 Essential (primary) hypertension: Secondary | ICD-10-CM

## 2019-02-19 DIAGNOSIS — R7303 Prediabetes: Secondary | ICD-10-CM | POA: Insufficient documentation

## 2019-02-19 DIAGNOSIS — E559 Vitamin D deficiency, unspecified: Secondary | ICD-10-CM

## 2019-02-19 DIAGNOSIS — E66813 Obesity, class 3: Secondary | ICD-10-CM

## 2019-02-19 DIAGNOSIS — Z6841 Body Mass Index (BMI) 40.0 and over, adult: Secondary | ICD-10-CM

## 2019-02-19 DIAGNOSIS — Z1231 Encounter for screening mammogram for malignant neoplasm of breast: Secondary | ICD-10-CM

## 2019-02-19 LAB — POCT GLYCOSYLATED HEMOGLOBIN (HGB A1C): HBA1C, POC (PREDIABETIC RANGE): 5.9 % (ref 5.7–6.4)

## 2019-02-19 MED ORDER — LOSARTAN POTASSIUM 100 MG PO TABS: 100.0000 mg | ORAL_TABLET | Freq: Every day | ORAL | 6 refills | Status: DC

## 2019-02-19 NOTE — Patient Instructions (Signed)
If you have not heard back from me in 1 week with the results of your sleep titration study, please call me so that I can follow-up with the sleep specialist.  Your blood pressure is not at goal.  We have increased the losartan to 100 mg daily.  Try to check your blood pressure at least twice a week.  The goal is 130/80 or lower.   Prediabetes Eating Plan Prediabetes is a condition that causes blood sugar (glucose) levels to be higher than normal. This increases the risk for developing diabetes. In order to prevent diabetes from developing, your health care provider may recommend a diet and other lifestyle changes to help you:  Control your blood glucose levels.  Improve your cholesterol levels.  Manage your blood pressure. Your health care provider may recommend working with a diet and nutrition specialist (dietitian) to make a meal plan that is best for you. What are tips for following this plan? Lifestyle  Set weight loss goals with the help of your health care team. It is recommended that most people with prediabetes lose 7% of their current body weight.  Exercise for at least 30 minutes at least 5 days a week.  Attend a support group or seek ongoing support from a mental health counselor.  Take over-the-counter and prescription medicines only as told by your health care provider. Reading food labels  Read food labels to check the amount of fat, salt (sodium), and sugar in prepackaged foods. Avoid foods that have: ? Saturated fats. ? Trans fats. ? Added sugars.  Avoid foods that have more than 300 milligrams (mg) of sodium per serving. Limit your daily sodium intake to less than 2,300 mg each day. Shopping  Avoid buying pre-made and processed foods. Cooking  Cook with olive oil. Do not use butter, lard, or ghee.  Bake, broil, grill, or boil foods. Avoid frying. Meal planning   Work with your dietitian to develop an eating plan that is right for you. This may include: ?  Tracking how many calories you take in. Use a food diary, notebook, or mobile application to track what you eat at each meal. ? Using the glycemic index (GI) to plan your meals. The index tells you how quickly a food will raise your blood glucose. Choose low-GI foods. These foods take a longer time to raise blood glucose.  Consider following a Mediterranean diet. This diet includes: ? Several servings each day of fresh fruits and vegetables. ? Eating fish at least twice a week. ? Several servings each day of whole grains, beans, nuts, and seeds. ? Using olive oil instead of other fats. ? Moderate alcohol consumption. ? Eating small amounts of red meat and whole-fat dairy.  If you have high blood pressure, you may need to limit your sodium intake or follow a diet such as the DASH eating plan. DASH is an eating plan that aims to lower high blood pressure. What foods are recommended? The items listed below may not be a complete list. Talk with your dietitian about what dietary choices are best for you. Grains Whole grains, such as whole-wheat or whole-grain breads, crackers, cereals, and pasta. Unsweetened oatmeal. Bulgur. Barley. Quinoa. Brown rice. Corn or whole-wheat flour tortillas or taco shells. Vegetables Lettuce. Spinach. Peas. Beets. Cauliflower. Cabbage. Broccoli. Carrots. Tomatoes. Squash. Eggplant. Herbs. Peppers. Onions. Cucumbers. Brussels sprouts. Fruits Berries. Bananas. Apples. Oranges. Grapes. Papaya. Mango. Pomegranate. Kiwi. Grapefruit. Cherries. Meats and other protein foods Seafood. Poultry without skin. Lean cuts of pork  and beef. Tofu. Eggs. Nuts. Beans. Dairy Low-fat or fat-free dairy products, such as yogurt, cottage cheese, and cheese. Beverages Water. Tea. Coffee. Sugar-free or diet soda. Seltzer water. Lowfat or no-fat milk. Milk alternatives, such as soy or almond milk. Fats and oils Olive oil. Canola oil. Sunflower oil. Grapeseed oil. Avocado. Walnuts. Sweets  and desserts Sugar-free or low-fat pudding. Sugar-free or low-fat ice cream and other frozen treats. Seasoning and other foods Herbs. Sodium-free spices. Mustard. Relish. Low-fat, low-sugar ketchup. Low-fat, low-sugar barbecue sauce. Low-fat or fat-free mayonnaise. What foods are not recommended? The items listed below may not be a complete list. Talk with your dietitian about what dietary choices are best for you. Grains Refined white flour and flour products, such as bread, pasta, snack foods, and cereals. Vegetables Canned vegetables. Frozen vegetables with butter or cream sauce. Fruits Fruits canned with syrup. Meats and other protein foods Fatty cuts of meat. Poultry with skin. Breaded or fried meat. Processed meats. Dairy Full-fat yogurt, cheese, or milk. Beverages Sweetened drinks, such as sweet iced tea and soda. Fats and oils Butter. Lard. Ghee. Sweets and desserts Baked goods, such as cake, cupcakes, pastries, cookies, and cheesecake. Seasoning and other foods Spice mixes with added salt. Ketchup. Barbecue sauce. Mayonnaise. Summary  To prevent diabetes from developing, you may need to make diet and other lifestyle changes to help control blood sugar, improve cholesterol levels, and manage your blood pressure.  Set weight loss goals with the help of your health care team. It is recommended that most people with prediabetes lose 7 percent of their current body weight.  Consider following a Mediterranean diet that includes plenty of fresh fruits and vegetables, whole grains, beans, nuts, seeds, fish, lean meat, low-fat dairy, and healthy oils. This information is not intended to replace advice given to you by your health care provider. Make sure you discuss any questions you have with your health care provider. Document Released: 04/08/2015 Document Revised: 01/26/2017 Document Reviewed: 01/26/2017 Elsevier Interactive Patient Education  2019 [employer].

## 2019-02-19 NOTE — Progress Notes (Signed)
Patient ID: Janet [name], female    DOB: [birth year]  MRN: [id number]  CC: Hypertension   Subjective: Janet [name] is a 48 y.o. female who presents for chronic ds management Her concerns today include:  Pt with hx of HTN, HL, GERD, obesity, vit B12 def, vit D def, obesity, anx/dep, psoriasis.  Vit D def: Taking vitamin D supplement 1000 IU daily over-the-counter.    Vit B12: Taking 1000 mcg 2 daily  OSA:  Done 01/07/2019.  Waiting on results of titration study  Pre-DM:  Did not tolerate Metformin due to diarrhea.  Doing half-half with sweet tea and doing same with Sprite.  Loves chips. Not walking as much Gained 14 lbs since las mth  HTN; compliant with meds and limits salt in foods Does not have device to check blood pressure but can use mom's Denies chest pains, shortness of breath, lower extremity edema, PND orthopnea.  Anx/dep: tomorrow is the 1 yr anniversary of father's death and she is feeling a bit down about that.  She took tomorrow off from work.  She feels she is doing okay on Paxil and does not need an increased dose.  She does not feel she needs any counseling. Patient Active Problem List   Diagnosis Date Noted  . OSA (obstructive sleep apnea) 01/28/2019  . Seborrheic dermatitis of scalp 06/15/2018  . Psoriasis 02/14/2018  . Vitamin B 12 deficiency 02/14/2018  . Vitamin D deficiency 02/14/2018  . Anxiety and depression 02/03/2015  . Hyperlipemia 02/03/2015  . Obesity 02/03/2015  . Anemia due to chronic blood loss - reports history of evaluation and told due to menorrhagia 05/30/2014  . 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 Essential hypertension 08/22/2007  . GERD 08/22/2007  . ABFND PAP SMEAR HGSIL 08/22/2007     Current Outpatient Medications on File Prior to Visit  Medication Sig Dispense Refill  . famotidine (PEPCID) 20 MG tablet Take 1 tablet (20 mg total) by mouth 2 (two) times daily. 60 tablet 2  . hydrochlorothiazide (HYDRODIURIL) 25 MG tablet TAKE 1  TABLET (25 MG TOTAL) BY MOUTH DAILY. 30 tablet 0  . PARoxetine (PAXIL) 10 MG tablet Take 1/2 tab PO daily x 2 weeks then 1 tab daily. 30 tablet 1  . pravastatin (PRAVACHOL) 40 MG tablet Take 1 tablet (40 mg total) by mouth daily. 90 tablet 3  . triamcinolone cream (KENALOG) 0.1 % Apply BID as needed to affected areas on extremities. 45 g 1  . vitamin B-12 (CYANOCOBALAMIN) 1000 MCG tablet Take 1 tablet (1,000 mcg total) by mouth daily. 100 tablet 1  . albuterol (PROVENTIL) (2.5 MG/3ML) 0.083% nebulizer solution Take 6 mLs (5 mg total) by nebulization every 4 (four) hours as needed for wheezing. (Patient not taking: Reported on 09/08/2018) 75 mL 0   No current facility-administered medications on file prior to visit.     Allergies  Allergen Reactions  . Lidocaine     ? Lidocaine allergy? Reports heart racing when has "shot of medicine in cevix" for leep  . Lisinopril-Hydrochlorothiazide     Leg cramps.  . Norvasc [Amlodipine Besylate]     Leg pain  . Penicillins Itching    Social History   Socioeconomic History  . Marital status: Single    Spouse name: Not on file  . Number of children: Not on file  . Years of education: Not on file  . Highest education level: Not on file  Occupational History  . Not on file  Social Needs  .  Financial resource strain: Not on file  . Food insecurity:    Worry: Not on file    Inability: Not on file  . Transportation needs:    Medical: Not on file    Non-medical: Not on file  Tobacco Use  . Smoking status: Never Smoker  . Smokeless tobacco: Never Used  Substance and Sexual Activity  . Alcohol use: No  . Drug use: No  . Sexual activity: Never    Partners: Male    Birth control/protection: None  Lifestyle  . Physical activity:    Days per week: 5 days    Minutes per session: 60 min  . Stress: Very much  Relationships  . Social connections:    Talks on phone: More than three times a week    Gets together: More than three times a week     Attends religious service: More than 4 times per year    Active member of club or organization: No    Attends meetings of clubs or organizations: Never    Relationship status: Divorced  . Intimate partner violence:    Fear of current or ex partner: No    Emotionally abused: No    Physically abused: No    Forced sexual activity: No  Other Topics Concern  . Not on file  Social History Narrative   Work or School: daycare in infant room      Home Situation: lives alone      Spiritual Beliefs: none      Lifestyle: no regular exercise; diet is poor             Family History  Problem Relation Age of Onset  . Diabetes Mother   . Hypertension Mother   . Fibroids Mother   . Heart disease Father   . Hypertension Father   . Heart attack Father   . Cancer Father        liver cancer  . Diabetes Brother   . Hypertension Brother   . Diabetes Brother     Past Surgical History:  Procedure Laterality Date  . LEEP  2009   CIN I negative margin  . SKIN CANCER EXCISION  2010   chest area    ROS: Review of Systems Negative except as stated above  PHYSICAL EXAM: BP (!) 155/96   Pulse 85   Temp 98.2 F (36.8 C) (Oral)   Resp 16   Wt 294 lb 12.8 oz (133.7 kg)   SpO2 97%   BMI 47.58 kg/m   Wt Readings from Last 3 Encounters:  02/19/19 294 lb 12.8 oz (133.7 kg)  02/11/19 280 lb (127 kg)  01/15/19 280 lb (127 kg)  BP 150/100  Physical Exam  General appearance - alert, well appearing, and in no distress Mental status - normal mood, behavior, speech, dress, motor activity, and thought processes Mouth - mucous membranes moist, pharynx normal without lesions Neck - supple, no significant adenopathy Chest - clear to auscultation, no wheezes, rales or rhonchi, symmetric air entry Heart - normal rate, regular rhythm, normal S1, S2, no murmurs, rubs, clicks or gallops Extremities - peripheral pulses normal, no pedal edema, no clubbing or cyanosis   Depression screen [facility] 2/9  02/19/2019 09/14/2018 02/14/2018  Decreased Interest 2 0 2  Down, Depressed, Hopeless 1 0 3  PHQ - 2 Score 3 0 5  Altered sleeping 3 - 2  Tired, decreased energy 3 - 2  Change in appetite 3 - 2  Feeling bad or  failure about yourself  1 - 1  Trouble concentrating 1 - 1  Moving slowly or fidgety/restless 0 - 1  Suicidal thoughts 0 - 0  PHQ-9 Score 14 - 14    Lab Results  Component Value Date   HGBA1C 5.9 02/19/2019    CMP Latest Ref Rng & Units 09/08/2018 09/29/2017 03/16/2017  Glucose 65 - 99 mg/dL 150(H) 92 91  BUN 6 - 24 mg/dL 10 13 13   Creatinine 0.57 - 1.00 mg/dL 0.73 0.60 0.70  Sodium 134 - 144 mmol/L 142 143 143  Potassium 3.5 - 5.2 mmol/L 3.8 4.2 4.5  Chloride 96 - 106 mmol/L 103 101 101  CO2 20 - 29 mmol/L 21 24 26   Calcium 8.7 - 10.2 mg/dL 9.1 9.7 9.8  Total Protein 6.0 - 8.5 g/dL 6.8 - 7.3  Total Bilirubin 0.0 - 1.2 mg/dL <0.2 - 0.2  Alkaline Phos 39 - 117 IU/L 69 - 72  AST 0 - 40 IU/L 23 - 25  ALT 0 - 32 IU/L 24 - 30   Lipid Panel     Component Value Date/Time   CHOL 245 (H) 09/08/2018 1445   TRIG 188 (H) 09/08/2018 1445   HDL 58 09/08/2018 1445   CHOLHDL 4.2 09/08/2018 1445   CHOLHDL 4.0 12/14/2016 1421   VLDL 32 (H) 12/14/2016 1421   LDLCALC 149 (H) 09/08/2018 1445    CBC    Component Value Date/Time   WBC 6.5 09/08/2018 1445   WBC 9.4 10/11/2016 0745   RBC 4.20 09/08/2018 1445   RBC 4.45 10/11/2016 0745   HGB 12.9 09/08/2018 1445   HCT 38.3 09/08/2018 1445   PLT 301 09/08/2018 1445   MCV 91 09/08/2018 1445   MCH 30.7 09/08/2018 1445   MCH 30.8 10/11/2016 0745   MCHC 33.7 09/08/2018 1445   MCHC 33.3 10/11/2016 0745   RDW 13.2 09/08/2018 1445   LYMPHSABS 3.0 09/29/2017 1544   MONOABS 0.6 10/11/2016 0745   EOSABS 0.2 09/29/2017 1544   BASOSABS 0.0 09/29/2017 1544    ASSESSMENT AND PLAN:  1. Prediabetes A1c is improved from last visit. Dietary counseling given.  Discussed healthy snacks that she can purchase.  Advised her to eliminate  sugary drinks. Encourage her to get in 150 minutes/week of moderate intensity exercise Metformin taken off the list since she has not tolerated it. - POCT glycosylated hemoglobin (Hb A1C)  2. Essential hypertension Not at goal.  Increase Cozaar to 100 mg daily - losartan (COZAAR) 100 MG tablet; Take 1 tablet (100 mg total) by mouth daily.  Dispense: 30 tablet; Refill: 6  3. Class 3 severe obesity due to excess calories with serious comorbidity and body mass index (BMI) of 45.0 to 49.9 in adult [facility]) See #1 above  4. Vitamin B 12 deficiency Continue B12 supplement over-the-counter but advised to cut back to 1000 mcg daily  5. Vitamin D deficiency Continue vitamin D supplement over-the-counter.  6. Obstructive sleep apnea Await results of titration study.  Patient will call me back in about a week if she does not hear from me with results.   Patient was given the opportunity to ask questions.  Patient verbalized understanding of the plan and was able to repeat key elements of the plan.   Orders Placed This Encounter  Procedures  . POCT glycosylated hemoglobin (Hb A1C)     Requested Prescriptions   Signed Prescriptions Disp Refills  . losartan (COZAAR) 100 MG tablet 30 tablet 6    Sig: Take  1 tablet (100 mg total) by mouth daily.    Return in about 3 months (around 05/22/2019).  Karle Plumber, MD, FACP

## 2019-02-24 DIAGNOSIS — G4733 Obstructive sleep apnea (adult) (pediatric): Secondary | ICD-10-CM

## 2019-02-24 NOTE — Procedures (Signed)
Patient Name: Janet [name], Janet [name] Date: 02/11/2019 Gender: Female D.O.B: 1971-09-03 Age (years): 33 Referring Provider: Karle Plumber MD Height (inches): 66 Interpreting Physician: Baird Lyons MD, ABSM Weight (lbs): 280 RPSGT: Rebekah Chesterfield BMI: 45 MRN: [id number] Neck Size: 17.50  CLINICAL INFORMATION The patient is referred for a CPAP titration to treat sleep apnea. Date of NPSG, Split Night or HST: NPSG 01/15/2019  AHI 52.8/ hr, desaturation to 65%, body weight 280 lbs  SLEEP STUDY TECHNIQUE As per the AASM Manual for the Scoring of Sleep and Associated Events v2.3 (April 2016) with a hypopnea requiring 4% desaturations.  The channels recorded and monitored were frontal, central and occipital EEG, electrooculogram (EOG), submentalis EMG (chin), nasal and oral airflow, thoracic and abdominal wall motion, anterior tibialis EMG, snore microphone, electrocardiogram, and pulse oximetry. Continuous positive airway pressure (CPAP) was initiated at the beginning of the study and titrated to treat sleep-disordered breathing.  MEDICATIONS Medications self-administered by patient taken the night of the study : none reported  TECHNICIAN COMMENTS Comments added by technician: None Comments added by scorer: N/A RESPIRATORY PARAMETERS Optimal PAP Pressure (cm): 18 AHI at Optimal Pressure (/hr): 0.0 Overall Minimal O2 (%): 89.0 Supine % at Optimal Pressure (%): 100 Minimal O2 at Optimal Pressure (%): 92.0   SLEEP ARCHITECTURE The study was initiated at 10:48:11 PM and ended at 4:49:46 AM.  Sleep onset time was 9.5 minutes and the sleep efficiency was 79.0%%. The total sleep time was 285.5 minutes.  The patient spent 13.8%% of the night in stage N1 sleep, 48.5%% in stage N2 sleep, 15.8%% in stage N3 and 21.9% in REM.Stage REM latency was 78.0 minutes  Wake after sleep onset was 66.6. Alpha intrusion was absent. Supine sleep was 100.00%.  CARDIAC DATA The 2 lead EKG demonstrated  sinus rhythm. The mean heart rate was 78.5 beats per minute. Other EKG findings include: None.  LEG MOVEMENT DATA The total Periodic Limb Movements of Sleep (PLMS) were 0. The PLMS index was 0.0. A PLMS index of <15 is considered normal in adults.  IMPRESSIONS - The optimal PAP pressure was 18 cm of water. - Central sleep apnea was not noted during this titration (CAI = 1.3/h). - Mild oxygen desaturations were observed during this titration (min O2 = 89.0%).  Mean saturationon CPAP 18 was 95.7%. - The patient snored with moderate snoring volume during this titration study. - No cardiac abnormalities were observed during this study. - Clinically significant periodic limb movements were not noted during this study. Arousals associated with PLMs were rare.  DIAGNOSIS - Obstructive Sleep Apnea (327.23 [G47.33 ICD-10])  RECOMMENDATIONS - Trial of CPAP therapy on 18 cm H2O or autopap 10-20. - Patient used a Small size [employer] Full Face Mask Simplus mask and heated humidification. - Be careful with alcohol, sedatives and other CNS depressants that may worsen sleep apnea and disrupt normal sleep architecture. - Sleep hygiene should be reviewed to assess factors that may improve sleep quality. - Weight management and regular exercise should be initiated or continued.  [Electronically signed] 02/24/2019 02:14 PM  Baird Lyons MD, [place], American Board of Sleep Medicine   NPI: [id number]                          [place] [place] [facility]  ELECTRONICALLY SIGNED ON:  02/24/2019, 2:11 PM [place] PH: [phone]   FX: [phone] [place]  SLEEP MEDICINE

## 2019-02-26 ENCOUNTER — Telehealth: Payer: Self-pay

## 2019-02-26 NOTE — Telephone Encounter (Signed)
Call placed to the patient to follow up with the results of her sleep study titration and need for CPAP. She stated that she has no insurance.  Explained to her that the American Sleep [employer]nea Association - CPAP assistance program is on hold at this time as they have no refurbished machines. This was the program that this clinic worked with to provide CPAP machines for uninsured. She stated that she is not able to afford a new machine which may cost about $1000.  This clinic is in the process of trying to identify other options for patients. Informed her that  This CM to call her back as soon as possible with an update.

## 2019-02-26 NOTE — Telephone Encounter (Signed)
Contacted pt to go over cpap results pt didn't answer and was unable to lvm

## 2019-03-05 ENCOUNTER — Telehealth: Payer: Self-pay

## 2019-03-05 NOTE — Telephone Encounter (Signed)
Call placed to patient and informed her that [place] has a hardship program for CPAP machines for those individuals that have no insurance or are unable to afford the CPAP machine with their current insurance due to lack of coverage or high out of pocket expense. The patient was agreeable to a referral being made and authorized this CM to release her clinical and demographic and insurance information to [employer].    Referral faxed to [facility] and Arnetha Massy, [employer] rep informed of the referral.

## 2019-03-15 ENCOUNTER — Telehealth: Payer: Self-pay

## 2019-03-15 NOTE — Telephone Encounter (Signed)
Call placed to [facility] to inquire about the status of the CPAP referral. Spoke to [facility] who stated that they have not been able to reach the patient and left 3 messages for her. Informed Verdis Frederickson that patient will need to apply for the hardship program.    call placed to patient. She said that she has not heard from [facility],.  Provided her with their phone # [phone]. She said that she would call this afternoon.

## 2019-03-22 ENCOUNTER — Telehealth: Payer: Self-pay

## 2019-03-22 NOTE — Telephone Encounter (Signed)
Call received from [facility].  She explained that patient has been approved for the hardship program for CPAP machine. They have been in contact with her and she just needs to pick up the machine.

## 2019-04-25 ENCOUNTER — Other Ambulatory Visit: Payer: Self-pay | Admitting: Internal Medicine

## 2019-04-25 DIAGNOSIS — I10 Essential (primary) hypertension: Secondary | ICD-10-CM

## 2019-04-25 MED FILL — HYDROCHLOROTHIAZIDE 25 MG T: 25 | 30 days supply | Qty: 30 | Fill #0

## 2019-04-25 MED FILL — PRAVASTATIN NA 40 MG TAB: 40 | 30 days supply | Qty: 30 | Fill #2

## 2019-04-26 MED FILL — LOSARTAN POTASSIUM 100 MG T: 100 | 90 days supply | Qty: 90 | Fill #0

## 2019-05-14 ENCOUNTER — Other Ambulatory Visit: Payer: Self-pay

## 2019-05-14 ENCOUNTER — Ambulatory Visit
Admission: RE | Admit: 2019-05-14 | Discharge: 2019-05-14 | Disposition: A | Discharge status: Home / Self Care | Payer: No Typology Code available for payment source | Source: Ambulatory Visit | Attending: Internal Medicine | Admitting: Internal Medicine

## 2019-05-14 DIAGNOSIS — Z1231 Encounter for screening mammogram for malignant neoplasm of breast: Secondary | ICD-10-CM

## 2019-05-17 ENCOUNTER — Telehealth: Payer: Self-pay

## 2019-05-17 NOTE — Telephone Encounter (Signed)
Contacted pt to go over MM results pt is aware and doesn't have any questions or concerns

## 2019-05-22 ENCOUNTER — Encounter: Payer: Self-pay | Admitting: Internal Medicine

## 2019-05-22 ENCOUNTER — Ambulatory Visit: Payer: Self-pay | Attending: Internal Medicine | Admitting: Internal Medicine

## 2019-05-22 ENCOUNTER — Other Ambulatory Visit: Payer: Self-pay

## 2019-05-22 DIAGNOSIS — Z9989 Dependence on other enabling machines and devices: Secondary | ICD-10-CM

## 2019-05-22 DIAGNOSIS — I10 Essential (primary) hypertension: Secondary | ICD-10-CM

## 2019-05-22 DIAGNOSIS — K219 Gastro-esophageal reflux disease without esophagitis: Secondary | ICD-10-CM

## 2019-05-22 DIAGNOSIS — F419 Anxiety disorder, unspecified: Secondary | ICD-10-CM

## 2019-05-22 DIAGNOSIS — E785 Hyperlipidemia, unspecified: Secondary | ICD-10-CM

## 2019-05-22 DIAGNOSIS — R7303 Prediabetes: Secondary | ICD-10-CM

## 2019-05-22 DIAGNOSIS — F329 Major depressive disorder, single episode, unspecified: Secondary | ICD-10-CM

## 2019-05-22 DIAGNOSIS — Z6841 Body Mass Index (BMI) 40.0 and over, adult: Secondary | ICD-10-CM

## 2019-05-22 DIAGNOSIS — G4733 Obstructive sleep apnea (adult) (pediatric): Secondary | ICD-10-CM

## 2019-05-22 MED ORDER — OMEPRAZOLE 20 MG PO CPDR: 20.0000 mg | DELAYED_RELEASE_CAPSULE | Freq: Every day | ORAL | 3 refills | Status: DC | PRN

## 2019-05-22 MED ORDER — HYDROCHLOROTHIAZIDE 25 MG PO TABS: 25.0000 mg | ORAL_TABLET | Freq: Every day | ORAL | 2 refills | Status: DC

## 2019-05-22 MED ORDER — PRAVASTATIN SODIUM 40 MG PO TABS: 40.0000 mg | ORAL_TABLET | Freq: Every day | ORAL | 3 refills | Status: DC

## 2019-05-22 MED ORDER — PAROXETINE HCL 10 MG PO TABS: 10.0000 mg | ORAL_TABLET | Freq: Every day | ORAL | 5 refills | Status: DC

## 2019-05-22 NOTE — Progress Notes (Signed)
Pt states the pepcid is on back order

## 2019-05-22 NOTE — Progress Notes (Signed)
Virtual Visit via Telephone Note Due to current restrictions/limitations of in-office visits due to the COVID-19 pandemic, this scheduled clinical appointment was converted to a telehealth visit  I connected with Janet [name] on 05/22/19 at 4:27 p.m EDT by telephone and verified that I am speaking with the correct person using two identifiers. I am in my office.  The patient is at home.  Only the patient and myself participated in this encounter.  I discussed the limitations, risks, security and privacy concerns of performing an evaluation and management service by telephone and the availability of in person appointments. I also discussed with the patient that there may be a patient responsible charge related to this service. The patient expressed understanding and agreed to proceed.   History of Present Illness: Pt with hx of pre-DM,HTN, HL, GERD, obesity, OSA,  vitB12 def, vit D def, obesity, anx/dep, psoriasis. Last seen 02/2019  HYPERTENSION Currently taking: see medication list Med Adherence: [x]  Yes    []  No Medication side effects: []  Yes    [x]  No Adherence with salt restriction: [x]  Yes    []  No Home Monitoring?: [x]  Yes - at her mother's house.  States her mom checks it for her and has told her that her numbers are good.  However she has not written them down and does not recall what the numbers were. Monitoring Frequency: []  Yes    []  No Home BP results range: []  Yes    []  No SOB? []  Yes    [x]  No Chest Pain?: []  Yes    [x]  No Leg swelling?: []  Yes    [x]  No Headaches?: []  Yes    [x]  No Dizziness? []  Yes    [x]  No Comments:   OSA:  She did get CPAP machine and is using it.  She is trying to get used to the feel of the mass on her face at night.  Daytime tiredness has decreased significantly.  Obesity/Pre-DM:  Walks 2 laps around her apartment building every night.  Doing better with eating habits; less snacking.  Not sure if she has loss wgh  HL:   Compliant with  pravastatin.  GERD:  Pepcid on back order.  Gets bad heartburn with certain foods especially with sweet tea She is requesting something to use in place of Pepcid until it comes in.    Observations/Objective: No direct observation done as this was a telephone encounter.  Assessment and Plan: 1. Essential hypertension Continue hydrochlorothiazide and Cozaar.  Encourage DASH diet - hydrochlorothiazide (HYDRODIURIL) 25 MG tablet; Take 1 tablet (25 mg total) by mouth daily.  Dispense: 90 tablet; Refill: 2  2. Prediabetes 3. Class 3 severe obesity due to excess calories with serious comorbidity and body mass index (BMI) of 45.0 to 49.9 in adult [facility]) Commended her on trying to exercise regularly. Dietary counseling given.  Advised her to eliminate sweet drinks from her diet including sweet tea.  4. Anxiety and depression - PARoxetine (PAXIL) 10 MG tablet; Take 1 tablet (10 mg total) by mouth daily.  Dispense: 30 tablet; Refill: 5  5. Hyperlipidemia, unspecified hyperlipidemia type - pravastatin (PRAVACHOL) 40 MG tablet; Take 1 tablet (40 mg total) by mouth daily.  Dispense: 90 tablet; Refill: 3  6. OSA on CPAP Encouraged her to continue being compliant with the CPAP  7. Gastroesophageal reflux disease without esophagitis GERD precautions discussed and encouraged. - omeprazole (PRILOSEC) 20 MG capsule; Take 1 capsule (20 mg total) by mouth daily as needed.  Dispense:  30 capsule; Refill: 3   Follow Up Instructions: 3-4 mths  I discussed the assessment and treatment plan with the patient. The patient was provided an opportunity to ask questions and all were answered. The patient agreed with the plan and demonstrated an understanding of the instructions.   The patient was advised to call back or seek an in-person evaluation if the symptoms worsen or if the condition fails to improve as anticipated.  I provided 11 minutes of non-face-to-face time during this encounter.   Karle Plumber, MD

## 2019-05-23 MED FILL — ?PRAVASTATIN NA 40 MG TAB: 40 | 30 days supply | Qty: 30 | Fill #0

## 2019-05-23 MED FILL — ?HYDROCHLOROTHIAZIDE 25MG T: 25 | 30 days supply | Qty: 30 | Fill #0

## 2019-05-23 MED FILL — ?OMEPRAZOLE 20 MG CAPSULE D: 20 | 30 days supply | Qty: 30 | Fill #0

## 2019-05-23 MED FILL — PAROXETINE HCL 10 MG TABS: 10 | 30 days supply | Qty: 30 | Fill #0

## 2019-08-14 MED FILL — ?OMEPRAZOLE 20 MG CAPSULE D: 20 | 30 days supply | Qty: 30 | Fill #1

## 2019-08-14 MED FILL — ?PRAVASTATIN NA 40 MG TAB: 40 | 30 days supply | Qty: 30 | Fill #1

## 2019-08-14 MED FILL — LOSARTAN POTASSIUM 100 MG T: 100 | 90 days supply | Qty: 90 | Fill #1

## 2019-08-14 MED FILL — ?HYDROCHLOROTHIAZIDE 25MG T: 25 | 30 days supply | Qty: 30 | Fill #1

## 2019-08-28 ENCOUNTER — Ambulatory Visit: Payer: No Typology Code available for payment source | Admitting: Internal Medicine

## 2019-09-17 ENCOUNTER — Ambulatory Visit: Payer: No Typology Code available for payment source | Admitting: Pharmacist

## 2019-09-17 ENCOUNTER — Ambulatory Visit: Payer: Self-pay | Attending: Internal Medicine | Admitting: Internal Medicine

## 2019-09-17 ENCOUNTER — Encounter: Payer: Self-pay | Admitting: Internal Medicine

## 2019-09-17 ENCOUNTER — Other Ambulatory Visit: Payer: Self-pay

## 2019-09-17 VITALS — BP 130/90 | HR 88 | Temp 98.1°F | Resp 16 | Wt 293.4 lb

## 2019-09-17 DIAGNOSIS — Z23 Encounter for immunization: Secondary | ICD-10-CM

## 2019-09-17 DIAGNOSIS — Z6841 Body Mass Index (BMI) 40.0 and over, adult: Secondary | ICD-10-CM

## 2019-09-17 DIAGNOSIS — I10 Essential (primary) hypertension: Secondary | ICD-10-CM

## 2019-09-17 DIAGNOSIS — H40003 Preglaucoma, unspecified, bilateral: Secondary | ICD-10-CM | POA: Insufficient documentation

## 2019-09-17 DIAGNOSIS — R7303 Prediabetes: Secondary | ICD-10-CM

## 2019-09-17 DIAGNOSIS — G4733 Obstructive sleep apnea (adult) (pediatric): Secondary | ICD-10-CM

## 2019-09-17 DIAGNOSIS — Z85820 Personal history of malignant melanoma of skin: Secondary | ICD-10-CM

## 2019-09-17 MED ORDER — CARVEDILOL 3.125 MG PO TABS: 3.1250 mg | ORAL_TABLET | Freq: Two times a day (BID) | ORAL | 3 refills | Status: DC

## 2019-09-17 MED FILL — CARVEDILOL 3.125 MG TABLET: 3.125 | 30 days supply | Qty: 60 | Fill #0

## 2019-09-17 NOTE — Progress Notes (Signed)
Patient presents for vaccination against influenza per orders of Dr. Johnson. Consent given. Counseling provided. No contraindications exists. Vaccine administered without incident.   

## 2019-09-17 NOTE — Patient Instructions (Addendum)
Your blood pressure is not well controlled.  We have added another blood pressure medication called carvedilol which she will take twice a day.  Continue to monitor your blood pressure at least twice a week.  Goal is 130/80 or lower.  Influenza Virus Vaccine injection (Fluarix) What is this medicine? INFLUENZA VIRUS VACCINE (in floo EN zuh VAHY ruhs vak SEEN) helps to reduce the risk of getting influenza also known as the flu. This medicine may be used for other purposes; ask your health care provider or pharmacist if you have questions. COMMON BRAND NAME(S): Fluarix, Fluzone What should I tell my health care provider before I take this medicine? They need to know if you have any of these conditions:  bleeding disorder like hemophilia  fever or infection  Guillain-Barre syndrome or other neurological problems  immune system problems  infection with the human immunodeficiency virus (HIV) or AIDS  low blood platelet counts  multiple sclerosis  an unusual or allergic reaction to influenza virus vaccine, eggs, chicken proteins, latex, gentamicin, other medicines, foods, dyes or preservatives  pregnant or trying to get pregnant  breast-feeding How should I use this medicine? This vaccine is for injection into a muscle. It is given by a health care professional. A copy of Vaccine Information Statements will be given before each vaccination. Read this sheet carefully each time. The sheet may change frequently. Talk to your pediatrician regarding the use of this medicine in children. Special care may be needed. Overdosage: If you think you have taken too much of this medicine contact a poison control center or emergency room at once. NOTE: This medicine is only for you. Do not share this medicine with others. What if I miss a dose? This does not apply. What may interact with this medicine?  chemotherapy or radiation therapy  medicines that lower your immune system like etanercept,  anakinra, infliximab, and adalimumab  medicines that treat or prevent blood clots like warfarin  phenytoin  steroid medicines like prednisone or cortisone  theophylline  vaccines This list may not describe all possible interactions. Give your health care provider a list of all the medicines, herbs, non-prescription drugs, or dietary supplements you use. Also tell them if you smoke, drink alcohol, or use illegal drugs. Some items may interact with your medicine. What should I watch for while using this medicine? Report any side effects that do not go away within 3 days to your doctor or health care professional. Call your health care provider if any unusual symptoms occur within 6 weeks of receiving this vaccine. You may still catch the flu, but the illness is not usually as bad. You cannot get the flu from the vaccine. The vaccine will not protect against colds or other illnesses that may cause fever. The vaccine is needed every year. What side effects may I notice from receiving this medicine? Side effects that you should report to your doctor or health care professional as soon as possible:  allergic reactions like skin rash, itching or hives, swelling of the face, lips, or tongue Side effects that usually do not require medical attention (report to your doctor or health care professional if they continue or are bothersome):  fever  headache  muscle aches and pains  pain, tenderness, redness, or swelling at site where injected  weak or tired This list may not describe all possible side effects. Call your doctor for medical advice about side effects. You may report side effects to FDA at 1-800-FDA-1088. Where should I  keep my medicine? This vaccine is only given in a clinic, pharmacy, doctor's office, or other health care setting and will not be stored at home. NOTE: This sheet is a summary. It may not cover all possible information. If you have questions about this medicine, talk  to your doctor, pharmacist, or health care provider.  2020 Elsevier/Gold Standard (2008-06-19 09:30:40)

## 2019-09-17 NOTE — Progress Notes (Signed)
Patient ID: Janet [name], female    DOB: [birth year]  MRN: [id number]  CC: Hypertension   Subjective: Laprecious [name] is a 48 y.o. female who presents for chronic ds management Janet [name] concerns today include:  Pt with hx of pre-DM,HTN, HL, GERD, obesity, OSA,  vitB12 def, vit D def, obesity, anx/dep, psoriasis.   Pre-DM: Started walking last wk. Walking daily for 16 mins Thinks eating habits better.  Not eating much sweets and bread Saw eye doctor last wk.  Told glaucoma suspect.  Has f/u next mth for recheck  HTN:  Checks BP several times a mth.  Reports readings have been a little high but she does not remember specifically the numbers Compliant with meds which are HCTZ and lisinopril.  Denies any chest pains or shortness of breath.  No lower extremity edema.  No chronic headaches or dizziness.  OSA: compliant with CPAP.  She feels better and more rested.  Hx of melanoma anterior chest 9 yrs ago. Last saw derm 09/2018   Patient Active Problem List   Diagnosis Date Noted  . Prediabetes 02/19/2019  . OSA (obstructive sleep apnea) 01/28/2019  . Seborrheic dermatitis of scalp 06/15/2018  . Psoriasis 02/14/2018  . Vitamin B 12 deficiency 02/14/2018  . Vitamin D deficiency 02/14/2018  . Anxiety and depression 02/03/2015  . Hyperlipemia 02/03/2015  . Obesity 02/03/2015  . Anemia due to chronic blood loss - reports history of evaluation and told due to menorrhagia 05/30/2014  . 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 Essential hypertension 08/22/2007  . GERD 08/22/2007  . ABFND PAP SMEAR HGSIL 08/22/2007     Current Outpatient Medications on File Prior to Visit  Medication Sig Dispense Refill  . albuterol (PROVENTIL) (2.5 MG/3ML) 0.083% nebulizer solution Take 6 mLs (5 mg total) by nebulization every 4 (four) hours as needed for wheezing. (Patient not taking: Reported on 09/08/2018) 75 mL 0  . hydrochlorothiazide (HYDRODIURIL) 25 MG tablet Take 1 tablet (25 mg total) by mouth daily.  90 tablet 2  . losartan (COZAAR) 100 MG tablet Take 1 tablet (100 mg total) by mouth daily. 30 tablet 6  . omeprazole (PRILOSEC) 20 MG capsule Take 1 capsule (20 mg total) by mouth daily as needed. 30 capsule 3  . PARoxetine (PAXIL) 10 MG tablet Take 1 tablet (10 mg total) by mouth daily. 30 tablet 5  . pravastatin (PRAVACHOL) 40 MG tablet Take 1 tablet (40 mg total) by mouth daily. 90 tablet 3  . triamcinolone cream (KENALOG) 0.1 % Apply BID as needed to affected areas on extremities. 45 g 1  . vitamin B-12 (CYANOCOBALAMIN) 1000 MCG tablet Take 1 tablet (1,000 mcg total) by mouth daily. 100 tablet 1   No current facility-administered medications on file prior to visit.     Allergies  Allergen Reactions  . Lidocaine     ? Lidocaine allergy? Reports heart racing when has "shot of medicine in cevix" for leep  . Lisinopril-Hydrochlorothiazide     Leg cramps.  . Metformin And Related Diarrhea  . Norvasc [Amlodipine Besylate]     Leg pain  . Penicillins Itching    Social History   Socioeconomic History  . Marital status: Single    Spouse name: Not on file  . Number of children: Not on file  . Years of education: Not on file  . Highest education level: Not on file  Occupational History  . Not on file  Social Needs  . Financial resource strain: Not on file  .  Food insecurity    Worry: Not on file    Inability: Not on file  . Transportation needs    Medical: Not on file    Non-medical: Not on file  Tobacco Use  . Smoking status: Never Smoker  . Smokeless tobacco: Never Used  Substance and Sexual Activity  . Alcohol use: No  . Drug use: No  . Sexual activity: Never    Partners: Male    Birth control/protection: None  Lifestyle  . Physical activity    Days per week: 5 days    Minutes per session: 60 min  . Stress: Very much  Relationships  . Social connections    Talks on phone: More than three times a week    Gets together: More than three times a week    Attends  religious service: More than 4 times per year    Active member of club or organization: No    Attends meetings of clubs or organizations: Never    Relationship status: Divorced  . Intimate partner violence    Fear of current or ex partner: No    Emotionally abused: No    Physically abused: No    Forced sexual activity: No  Other Topics Concern  . Not on file  Social History Narrative   Work or School: daycare in infant room      Home Situation: lives alone      Spiritual Beliefs: none      Lifestyle: no regular exercise; diet is poor             Family History  Problem Relation Age of Onset  . Diabetes Mother   . Hypertension Mother   . Fibroids Mother   . Heart disease Father   . Hypertension Father   . Heart attack Father   . Cancer Father        liver cancer  . Diabetes Brother   . Hypertension Brother   . Diabetes Brother     Past Surgical History:  Procedure Laterality Date  . LEEP  2009   CIN I negative margin  . SKIN CANCER EXCISION  2010   chest area    ROS: Review of Systems Negative except as stated above  PHYSICAL EXAM: BP 138/84   Pulse 88   Temp 98.1 F (36.7 C) (Oral)   Resp 16   Wt 293 lb 6.4 oz (133.1 kg)   SpO2 98%   BMI 47.36 kg/m   Wt Readings from Last 3 Encounters:  09/17/19 293 lb 6.4 oz (133.1 kg)  02/19/19 294 lb 12.8 oz (133.7 kg)  02/11/19 280 lb (127 kg)   Physical Exam  General appearance - alert, well appearing, and in no distress Mental status - normal mood, behavior, speech, dress, motor activity, and thought processes Neck - supple, no significant adenopathy Chest - clear to auscultation, no wheezes, rales or rhonchi, symmetric air entry Heart - normal rate, regular rhythm, normal S1, S2, no murmurs, rubs, clicks or gallops Extremities - peripheral pulses normal, no pedal edema, no clubbing or cyanosis   CMP Latest Ref Rng & Units 09/08/2018 09/29/2017 03/16/2017  Glucose 65 - 99 mg/dL 150(H) 92 91  BUN 6 - 24  mg/dL 10 13 13   Creatinine 0.57 - 1.00 mg/dL 0.73 0.60 0.70  Sodium 134 - 144 mmol/L 142 143 143  Potassium 3.5 - 5.2 mmol/L 3.8 4.2 4.5  Chloride 96 - 106 mmol/L 103 101 101  CO2 20 - 29 mmol/L 21  24 26  Calcium 8.7 - 10.2 mg/dL 9.1 9.7 9.8  Total Protein 6.0 - 8.5 g/dL 6.8 - 7.3  Total Bilirubin 0.0 - 1.2 mg/dL <0.2 - 0.2  Alkaline Phos 39 - 117 IU/L 69 - 72  AST 0 - 40 IU/L 23 - 25  ALT 0 - 32 IU/L 24 - 30   Lipid Panel     Component Value Date/Time   CHOL 245 (H) 09/08/2018 1445   TRIG 188 (H) 09/08/2018 1445   HDL 58 09/08/2018 1445   CHOLHDL 4.2 09/08/2018 1445   CHOLHDL 4.0 12/14/2016 1421   VLDL 32 (H) 12/14/2016 1421   LDLCALC 149 (H) 09/08/2018 1445    CBC    Component Value Date/Time   WBC 6.5 09/08/2018 1445   WBC 9.4 10/11/2016 0745   RBC 4.20 09/08/2018 1445   RBC 4.45 10/11/2016 0745   HGB 12.9 09/08/2018 1445   HCT 38.3 09/08/2018 1445   PLT 301 09/08/2018 1445   MCV 91 09/08/2018 1445   MCH 30.7 09/08/2018 1445   MCH 30.8 10/11/2016 0745   MCHC 33.7 09/08/2018 1445   MCHC 33.3 10/11/2016 0745   RDW 13.2 09/08/2018 1445   LYMPHSABS 3.0 09/29/2017 1544   MONOABS 0.6 10/11/2016 0745   EOSABS 0.2 09/29/2017 1544   BASOSABS 0.0 09/29/2017 1544    ASSESSMENT AND PLAN:  1. Essential hypertension Not at goal.  Continue lisinopril and HCTZ.  Add carvedilol.  DASH diet encouraged - carvedilol (COREG) 3.125 MG tablet; Take 1 tablet (3.125 mg total) by mouth 2 (two) times daily with a meal.  Dispense: 60 tablet; Refill: 3  2. Prediabetes Commended Janet [name] on starting walking for exercise.  Encouraged Janet [name] to try to get up to 30 minutes.  Encourage healthy eating habits - CBC - Comprehensive metabolic panel - Lipid panel - Hemoglobin A1c  3. Class 3 severe obesity due to excess calories with serious comorbidity and body mass index (BMI) of 45.0 to 49.9 in adult [facility]) See #2 above  4. Personal history of malignant melanoma of skin - Ambulatory referral to  Dermatology  5. Need for influenza vaccination Given  6. OSA (obstructive sleep apnea) Continue CPAP  7. Glaucoma suspect of both eyes Being seen by ophthalmology   Patient was given the opportunity to ask questions.  Patient verbalized understanding of the plan and was able to repeat key elements of the plan.   No orders of the defined types were placed in this encounter.    Requested Prescriptions    No prescriptions requested or ordered in this encounter    No follow-ups on file.  Karle Plumber, MD, FACP

## 2019-09-18 LAB — CBC
Hematocrit: 41.4 % (ref 34.0–46.6)
Hemoglobin: 13.6 g/dL (ref 11.1–15.9)
MCH: 31.3 pg (ref 26.6–33.0)
MCHC: 32.9 g/dL (ref 31.5–35.7)
MCV: 95 fL (ref 79–97)
Platelets: 407 10*3/uL (ref 150–450)
RBC: 4.35 x10E6/uL (ref 3.77–5.28)
RDW: 12.3 % (ref 11.7–15.4)
WBC: 9.5 10*3/uL (ref 3.4–10.8)

## 2019-09-18 LAB — LIPID PANEL
Chol/HDL Ratio: 3.5 ratio (ref 0.0–4.4)
Cholesterol, Total: 202 mg/dL — ABNORMAL HIGH (ref 100–199)
HDL: 58 mg/dL (ref >39)
LDL Chol Calc (NIH): 121 mg/dL — ABNORMAL HIGH (ref 0–99)
Triglycerides: 130 mg/dL (ref 0–149)
VLDL Cholesterol Cal: 23 mg/dL (ref 5–40)

## 2019-09-18 LAB — COMPREHENSIVE METABOLIC PANEL
ALT: 27 IU/L (ref 0–32)
AST: 24 IU/L (ref 0–40)
Albumin/Globulin Ratio: 1.8 (ref 1.2–2.2)
Albumin: 4.7 g/dL (ref 3.8–4.8)
Alkaline Phosphatase: 86 IU/L (ref 39–117)
BUN/Creatinine Ratio: 16 (ref 9–23)
BUN: 13 mg/dL (ref 6–24)
Bilirubin Total: <0.2 mg/dL (ref 0.0–1.2)
CO2: 24 mmol/L (ref 20–29)
Calcium: 9.9 mg/dL (ref 8.7–10.2)
Chloride: 101 mmol/L (ref 96–106)
Creatinine, Ser: 0.79 mg/dL (ref 0.57–1.00)
GFR calc Af Amer: 104 mL/min/{1.73_m2} (ref >59)
GFR calc non Af Amer: 90 mL/min/{1.73_m2} (ref >59)
Globulin, Total: 2.6 g/dL (ref 1.5–4.5)
Glucose: 142 mg/dL — ABNORMAL HIGH (ref 65–99)
Potassium: 4.2 mmol/L (ref 3.5–5.2)
Sodium: 140 mmol/L (ref 134–144)
Total Protein: 7.3 g/dL (ref 6.0–8.5)

## 2019-09-18 LAB — HEMOGLOBIN A1C
Est. average glucose Bld gHb Est-mCnc: 128 mg/dL
Hgb A1c MFr Bld: 6.1 % — ABNORMAL HIGH (ref 4.8–5.6)

## 2019-09-21 ENCOUNTER — Telehealth: Payer: Self-pay

## 2019-09-21 DIAGNOSIS — E785 Hyperlipidemia, unspecified: Secondary | ICD-10-CM

## 2019-09-21 MED ORDER — ATORVASTATIN CALCIUM 20 MG PO TABS: 20.0000 mg | ORAL_TABLET | Freq: Every day | ORAL | 3 refills | Status: DC

## 2019-09-21 NOTE — Telephone Encounter (Signed)
Contacted pt go over lab results pt is aware of results.  Dr. Wynetta Emery pt states she has been taking the cholesterol medicine daily. Pt is okay with medication change and she will come on Monday to pick up

## 2019-09-24 MED FILL — ATORVASTATIN CALCIUM 20 MG: 20 | 30 days supply | Qty: 30 | Fill #0

## 2019-09-26 ENCOUNTER — Ambulatory Visit: Payer: No Typology Code available for payment source

## 2019-10-10 ENCOUNTER — Ambulatory Visit: Payer: No Typology Code available for payment source

## 2019-11-05 MED FILL — ?HYDROCHLOROTHIAZIDE 25MG T: 25 | 30 days supply | Qty: 30 | Fill #2

## 2019-11-05 MED FILL — ATORVASTATIN CALCIUM 20 MG: 20 | 30 days supply | Qty: 30 | Fill #1

## 2019-11-05 MED FILL — CARVEDILOL 3.125 MG TABLET: 3.125 | 30 days supply | Qty: 60 | Fill #1

## 2019-11-05 MED FILL — ?OMEPRAZOLE 20MG CAP DR: 20 | 30 days supply | Qty: 30 | Fill #2

## 2019-12-21 MED FILL — ATORVASTATIN CALCIUM 20 MG: 20 | 30 days supply | Qty: 30 | Fill #2

## 2019-12-25 ENCOUNTER — Ambulatory Visit: Payer: No Typology Code available for payment source | Admitting: Internal Medicine

## 2019-12-28 ENCOUNTER — Ambulatory Visit: Payer: No Typology Code available for payment source | Admitting: Internal Medicine

## 2020-01-17 ENCOUNTER — Ambulatory Visit: Payer: Self-pay | Attending: Internal Medicine | Admitting: Family

## 2020-01-17 ENCOUNTER — Other Ambulatory Visit: Payer: Self-pay

## 2020-01-17 DIAGNOSIS — R7303 Prediabetes: Secondary | ICD-10-CM

## 2020-01-17 DIAGNOSIS — I10 Essential (primary) hypertension: Secondary | ICD-10-CM

## 2020-01-17 NOTE — Progress Notes (Signed)
Virtual Visit via Telephone Note  I connected with Janet [name], on 01/17/2020 at 4:36 PM by telephone due to the COVID-19 pandemic and verified that I am speaking with the correct person using two identifiers.  Due to current restrictions/limitations of in-office visits due to the COVID-19 pandemic, this scheduled clinical appointment was converted to a telehealth visit.   Consent: I discussed the limitations, risks, security and privacy concerns of performing an evaluation and management service by telephone and the availability of in person appointments. I also discussed with the patient that there may be a patient responsible charge related to this service. The patient expressed understanding and agreed to proceed.   Location of Patient: Home  Location of Provider: [facility]   Persons participating in Telemedicine visit: [place], NP Orlan Leavens, CMA  History of Present Illness: 1. HYPERTENSION FOLLOW-UP: Currently taking: see medication list Med Adherence: [x]  Yes    []  No Medication side effects: []  Yes    [x]  No Adherence with salt restriction: [x]  Yes    []  No Home Monitoring?: [x]  Yes    []  No Monitoring Frequency: [x]  Yes    []  No Checks blood pressure daily after work. Home BP results range: []  Yes    [x]  No Left blood pressure readings at a family members home and cant remember the ranges. SOB? []  Yes    [x]  No Chest Pain?: []  Yes    [x]  No Leg swelling?: []  Yes    [x]  No Headaches?: []  Yes    [x]  No Dizziness? []  Yes    [x]  No Comments: Last seen October 2020 by Dr. Wynetta Emery during that encounter carvedilol was added to blood pressure management regimen as the patient's blood pressure was not at goal.  2. PRE DIABETES FOLLOW-UP:  Last A1C: 6.0 in October 2020 Results for orders placed or performed in visit on 09/17/19  CBC  Result Value Ref Range   WBC 9.5 3.4 - 10.8 x10E3/uL   RBC 4.35 3.77 - 5.28  x10E6/uL   Hemoglobin 13.6 11.1 - 15.9 g/dL   Hematocrit 41.4 34.0 - 46.6 %   MCV 95 79 - 97 fL   MCH 31.3 26.6 - 33.0 pg   MCHC 32.9 31.5 - 35.7 g/dL   RDW 12.3 11.7 - 15.4 %   Platelets 407 150 - 450 x10E3/uL  Comprehensive metabolic panel  Result Value Ref Range   Glucose 142 (H) 65 - 99 mg/dL   BUN 13 6 - 24 mg/dL   Creatinine, Ser 0.79 0.57 - 1.00 mg/dL   GFR calc non Af Amer 90 >59 mL/min/1.73   GFR calc Af Amer 104 >59 mL/min/1.73   BUN/Creatinine Ratio 16 9 - 23   Sodium 140 134 - 144 mmol/L   Potassium 4.2 3.5 - 5.2 mmol/L   Chloride 101 96 - 106 mmol/L   CO2 24 20 - 29 mmol/L   Calcium 9.9 8.7 - 10.2 mg/dL   Total Protein 7.3 6.0 - 8.5 g/dL   Albumin 4.7 3.8 - 4.8 g/dL   Globulin, Total 2.6 1.5 - 4.5 g/dL   Albumin/Globulin Ratio 1.8 1.2 - 2.2   Bilirubin Total <0.2 0.0 - 1.2 mg/dL   Alkaline Phosphatase 86 39 - 117 IU/L   AST 24 0 - 40 IU/L   ALT 27 0 - 32 IU/L  Lipid panel  Result Value Ref Range   Cholesterol, Total 202 (H) 100 - 199 mg/dL   Triglycerides  130 0 - 149 mg/dL   HDL 58 >39 mg/dL   VLDL Cholesterol Cal 23 5 - 40 mg/dL   LDL Chol Calc (NIH) 121 (H) 0 - 99 mg/dL   Chol/HDL Ratio 3.5 0.0 - 4.4 ratio  Hemoglobin A1c  Result Value Ref Range   Hgb A1c MFr Bld 6.1 (H) 4.8 - 5.6 %   Est. average glucose Bld gHb Est-mCnc 128 mg/dL    Med Adherence:  []  Yes    [x]  No Medication side effects:  []  Yes    [x]  No Home Monitoring?  []  Yes    [x]  No Home glucose results range: None  Diet Adherence: [x]  Yes    []  No Exercise: [x]  Yes    []  No Hypoglycemic episodes?: []  Yes    [x]  No Numbness of the feet? []  Yes    [x]  No Retinopathy hx? []  Yes    [x]  No Last eye exam: Unknown Comments: Did not take Metformin for prediabetes because of frequent bowel movements.  Walk for exercise everyday 20 minutes.  Last seen October 2020 by Dr. Wynetta Emery during that encounter patient was counseled on diet and exercise and labs were drawn.   Past Medical History:   Diagnosis Date  . ABFND PAP SMEAR HGSIL 08/22/2007  . Anemia due to chronic blood loss - reports history of evaluation and told due to menorrhagia 05/30/2014  . ANXIETY 08/22/2007  . Anxiety and depression   . Anxiety and depression   . Frequent headaches   . GERD 08/22/2007  . Hyperglycemia   . Hyperlipemia   . HYPERTENSION, BENIGN ESSENTIAL 08/22/2007  . 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 Obesity 02/03/2015  . PLANTAR FASCIITIS, RIGHT 07/01/2008  . PSORIASIS 08/22/2007   Allergies  Allergen Reactions  . Lidocaine     ? Lidocaine allergy? Reports heart racing when has "shot of medicine in cevix" for leep  . Lisinopril-Hydrochlorothiazide     Leg cramps.  . Metformin And Related Diarrhea  . Norvasc [Amlodipine Besylate]     Leg pain  . Penicillins Itching    Current Outpatient Medications on File Prior to Visit  Medication Sig Dispense Refill  . atorvastatin (LIPITOR) 20 MG tablet Take 1 tablet (20 mg total) by mouth daily. 90 tablet 3  . carvedilol (COREG) 3.125 MG tablet Take 1 tablet (3.125 mg total) by mouth 2 (two) times daily with a meal. 60 tablet 3  . hydrochlorothiazide (HYDRODIURIL) 25 MG tablet Take 1 tablet (25 mg total) by mouth daily. 90 tablet 2  . losartan (COZAAR) 100 MG tablet Take 1 tablet (100 mg total) by mouth daily. 30 tablet 6  . omeprazole (PRILOSEC) 20 MG capsule Take 1 capsule (20 mg total) by mouth daily as needed. 30 capsule 3  . PARoxetine (PAXIL) 10 MG tablet Take 1 tablet (10 mg total) by mouth daily. 30 tablet 5  . albuterol (PROVENTIL) (2.5 MG/3ML) 0.083% nebulizer solution Take 6 mLs (5 mg total) by nebulization every 4 (four) hours as needed for wheezing. (Patient not taking: Reported on 09/08/2018) 75 mL 0  . triamcinolone cream (KENALOG) 0.1 % Apply BID as needed to affected areas on extremities. (Patient not taking: Reported on 01/17/2020) 45 g 1  . vitamin B-12 (CYANOCOBALAMIN) 1000 MCG tablet Take 1 tablet (1,000 mcg total) by mouth daily. 100 tablet 1    No current facility-administered medications on file prior to visit.    Observations/Objective: Alert and oriented x 3. Not in acute distress. Physical examination not completed  as this is a telemedicine visit.  Assessment and Plan: 1. Essential hypertension: -Continue carvedilol, hydrochlorothiazide, and losartan for management of hypertension. -Follow-up with clinical pharmacist in 2 weeks for blood pressure check.  Please bring recorded blood pressure readings to this appointment. -Counseled on blood pressure goal of less than 130/80, low-sodium, DASH diet, medication compliance, 150 minutes of moderate intensity exercise per week. Discussed medication compliance, adverse effects.  2. Prediabetes: Patient have been counseled extensively about nutrition and exercise. Other issues discussed during this visit include: low cholesterol diet, weight control and daily exercise, foot care, annual eye examinations at Ophthalmology, importance of adherence with medications and regular follow-up. We also discussed long term complications of uncontrolled diabetes and hypertension.  Follow Up Instructions: Return in 2 weeks for blood pressure check with clinical pharmacist.  Follow-up with attending physician as needed for management of chronic conditions.   Patient was given clear instructions to go to Emergency Department or return to medical center if symptoms don't improve, worsen, or new problems develop.The patient verbalized understanding.  I discussed the assessment and treatment plan with the patient. The patient was provided an opportunity to ask questions and all were answered. The patient agreed with the plan and demonstrated an understanding of the instructions.   The patient was advised to call back or seek an in-person evaluation if the symptoms worsen or if the condition fails to improve as anticipated.  I provided 20 minutes total of non-face-to-face time during this encounter  including median intraservice time, reviewing previous notes, labs, imaging, medications, management and patient verbalized understanding.    Camillia Herter, NP  [facility] and [facility] [place], [place]   01/17/2020, 4:36 PM

## 2020-02-01 ENCOUNTER — Ambulatory Visit: Payer: No Typology Code available for payment source | Admitting: Pharmacist

## 2020-02-07 MED FILL — ATORVASTATIN CALCIUM 20 MG: 20 | 30 days supply | Qty: 30 | Fill #3

## 2020-02-07 MED FILL — LOSARTAN POTASSIUM 100 MG T: 100 | 30 days supply | Qty: 30 | Fill #2

## 2020-02-07 MED FILL — PAROXETINE HCL 10 MG TABS: 10 | 30 days supply | Qty: 30 | Fill #1

## 2020-02-07 MED FILL — CARVEDILOL 3.125 MG TABLET: 3.125 | 30 days supply | Qty: 60 | Fill #2

## 2020-02-07 MED FILL — OMEPRAZOLE 20 MG CAP: 20 | 30 days supply | Qty: 30 | Fill #3

## 2020-02-07 MED FILL — HYDROCHLOROTHIAZIDE 25 MG T: 25 | 30 days supply | Qty: 30 | Fill #3

## 2020-02-29 ENCOUNTER — Ambulatory Visit: Payer: Self-pay | Attending: Internal Medicine

## 2020-02-29 DIAGNOSIS — Z23 Encounter for immunization: Secondary | ICD-10-CM

## 2020-02-29 NOTE — Progress Notes (Signed)
   Covid-19 Vaccination Clinic  Name:  Birdena Casanas    MRN: [id number] DOB: 01-02-1971  02/29/2020  Ms. Bunde was observed post Covid-19 immunization for 15 minutes without incident. She was provided with Vaccine Information Sheet and instruction to access the V-Safe system.   Ms. Villada was instructed to call 911 with any severe reactions post vaccine: Marland Kitchen Difficulty breathing  . Swelling of face and throat  . A fast heartbeat  . A bad rash all over body  . Dizziness and weakness   Immunizations Administered    Name Date Dose VIS Date Route   Pfizer COVID-19 Vaccine 02/29/2020  2:58 PM 0.3 mL 11/16/2019 Intramuscular   Manufacturer: [place]   Lot: [id number]   [place]: [id number]

## 2020-03-25 ENCOUNTER — Encounter (HOSPITAL_COMMUNITY): Payer: Self-pay

## 2020-03-25 ENCOUNTER — Other Ambulatory Visit: Payer: Self-pay

## 2020-03-25 ENCOUNTER — Emergency Department (HOSPITAL_COMMUNITY): Payer: HRSA Program

## 2020-03-25 ENCOUNTER — Emergency Department (HOSPITAL_COMMUNITY)
Admission: EM | Admit: 2020-03-25 | Discharge: 2020-03-25 | Disposition: A | Discharge status: Home / Self Care | Payer: HRSA Program | Attending: Emergency Medicine | Admitting: Emergency Medicine

## 2020-03-25 ENCOUNTER — Other Ambulatory Visit: Payer: Self-pay | Admitting: Internal Medicine

## 2020-03-25 ENCOUNTER — Ambulatory Visit: Payer: No Typology Code available for payment source | Attending: Internal Medicine

## 2020-03-25 DIAGNOSIS — Z79899 Other long term (current) drug therapy: Secondary | ICD-10-CM | POA: Diagnosis not present

## 2020-03-25 DIAGNOSIS — U071 COVID-19: Secondary | ICD-10-CM | POA: Diagnosis not present

## 2020-03-25 DIAGNOSIS — I10 Essential (primary) hypertension: Secondary | ICD-10-CM

## 2020-03-25 DIAGNOSIS — K219 Gastro-esophageal reflux disease without esophagitis: Secondary | ICD-10-CM

## 2020-03-25 DIAGNOSIS — J069 Acute upper respiratory infection, unspecified: Secondary | ICD-10-CM

## 2020-03-25 DIAGNOSIS — M791 Myalgia, unspecified site: Secondary | ICD-10-CM | POA: Diagnosis present

## 2020-03-25 LAB — CBC
HCT: 39.6 % (ref 36.0–46.0)
Hemoglobin: 12.6 g/dL (ref 12.0–15.0)
MCH: 30.8 pg (ref 26.0–34.0)
MCHC: 31.8 g/dL (ref 30.0–36.0)
MCV: 96.8 fL (ref 80.0–100.0)
Platelets: 319 10*3/uL (ref 150–400)
RBC: 4.09 MIL/uL (ref 3.87–5.11)
RDW: 12.3 % (ref 11.5–15.5)
WBC: 6.1 10*3/uL (ref 4.0–10.5)
nRBC: 0 % (ref 0.0–0.2)

## 2020-03-25 LAB — BASIC METABOLIC PANEL
Anion gap: 13 (ref 5–15)
BUN: 9 mg/dL (ref 6–20)
CO2: 24 mmol/L (ref 22–32)
Calcium: 9.1 mg/dL (ref 8.9–10.3)
Chloride: 101 mmol/L (ref 98–111)
Creatinine, Ser: 0.71 mg/dL (ref 0.44–1.00)
GFR calc Af Amer: >60 mL/min (ref >60)
GFR calc non Af Amer: >60 mL/min (ref >60)
Glucose, Bld: 104 mg/dL — ABNORMAL HIGH (ref 70–99)
Potassium: 3.9 mmol/L (ref 3.5–5.1)
Sodium: 138 mmol/L (ref 135–145)

## 2020-03-25 LAB — RESPIRATORY PANEL BY RT PCR (FLU A&B, COVID)
Influenza A by PCR: NEGATIVE
Influenza B by PCR: NEGATIVE
SARS Coronavirus 2 by RT PCR: POSITIVE — AB

## 2020-03-25 MED ORDER — BENZONATATE 200 MG PO CAPS: 200.0000 mg | ORAL_CAPSULE | Freq: Three times a day (TID) | ORAL | 0 refills | Status: AC

## 2020-03-25 MED ORDER — ALBUTEROL SULFATE HFA 108 (90 BASE) MCG/ACT IN AERS: 2.0000 | INHALATION_SPRAY | RESPIRATORY_TRACT | 0 refills | Status: AC | PRN

## 2020-03-25 MED FILL — ?CARVEDILOL 3.125 MG TABLET: 3.125 | 30 days supply | Qty: 60 | Fill #3

## 2020-03-25 MED FILL — ?ATORVASTATIN 20 MG TABLET: 20 | 30 days supply | Qty: 30 | Fill #4

## 2020-03-25 MED FILL — HYDROCHLOROTHIAZIDE 25 MG T: 25 | 30 days supply | Qty: 30 | Fill #4

## 2020-03-25 NOTE — ED Provider Notes (Signed)
[facility] DEPT Provider Note   CSN: [id number] Arrival date & time: 03/25/20  1458     History Chief Complaint  Patient presents with  . Generalized Body Aches    Janet [name] is a 49 y.o. female.  49 year old female presents with complaint of feeling terrible, states she is having body aches, congestion, dry cough.  Patient feels like she has fevers at night although is not checking her temperature, states that she has chills all night long.  Symptoms started 1 week ago with diarrhea followed by onset of body aches and then cough.  Patient has lost sense of smell and taste.  No known sick contacts.  Patient did get her first Covid shot.  Janet [name] was evaluated in Emergency Department on 03/25/2020 for the symptoms described in the history of present illness. She was evaluated in the context of the global COVID-19 pandemic, which necessitated consideration that the patient might be at risk for infection with the SARS-CoV-2 virus that causes COVID-19. Institutional protocols and algorithms that pertain to the evaluation of patients at risk for COVID-19 are in a state of rapid change based on information released by regulatory bodies including the CDC and federal and state organizations. These policies and algorithms were followed during the patient's care in the ED.         Past Medical History:  Diagnosis Date  . ABFND PAP SMEAR HGSIL 08/22/2007  . Anemia due to chronic blood loss - reports history of evaluation and told due to menorrhagia 05/30/2014  . ANXIETY 08/22/2007  . Anxiety and depression   . Anxiety and depression   . Frequent headaches   . GERD 08/22/2007  . Hyperglycemia   . Hyperlipemia   . HYPERTENSION, BENIGN ESSENTIAL 08/22/2007  . 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 Obesity 02/03/2015  . PLANTAR FASCIITIS, RIGHT 07/01/2008  . PSORIASIS 08/22/2007    Patient Active Problem List   Diagnosis Date Noted  . Glaucoma suspect  of both eyes 09/17/2019  . Personal history of malignant melanoma of skin 09/17/2019  . Prediabetes 02/19/2019  . OSA (obstructive sleep apnea) 01/28/2019  . Seborrheic dermatitis of scalp 06/15/2018  . Psoriasis 02/14/2018  . Vitamin B 12 deficiency 02/14/2018  . Vitamin D deficiency 02/14/2018  . Anxiety and depression 02/03/2015  . Hyperlipemia 02/03/2015  . Obesity 02/03/2015  . Anemia due to chronic blood loss - reports history of evaluation and told due to menorrhagia 05/30/2014  . 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 Essential hypertension 08/22/2007  . GERD 08/22/2007  . ABFND PAP SMEAR HGSIL 08/22/2007    Past Surgical History:  Procedure Laterality Date  . LEEP  2009   CIN I negative margin  . SKIN CANCER EXCISION  2010   chest area     OB History    Gravida  1   Para  1   Term  1   Preterm      AB      Living  1     SAB      TAB      Ectopic      Multiple      Live Births  1           Family History  Problem Relation Age of Onset  . Diabetes Mother   . Hypertension Mother   . Fibroids Mother   . Heart disease Father   . Hypertension Father   . Heart attack Father   . Cancer  Father        liver cancer  . Diabetes Brother   . Hypertension Brother   . Diabetes Brother     Social History   Tobacco Use  . Smoking status: Never Smoker  . Smokeless tobacco: Never Used  Substance Use Topics  . Alcohol use: No  . Drug use: No    Home Medications Prior to Admission medications   Medication Sig Start Date End Date Taking? Authorizing Provider  albuterol (PROVENTIL) (2.5 MG/3ML) 0.083% nebulizer solution Take 6 mLs (5 mg total) by nebulization every 4 (four) hours as needed for wheezing. Patient not taking: Reported on 09/08/2018 12/27/16   Carlisle Cater, PA-C  albuterol (VENTOLIN HFA) 108 (90 Base) MCG/ACT inhaler Inhale 2 puffs into the lungs every 4 (four) hours as needed for wheezing or shortness of breath. 03/25/20   Tacy Learn,  PA-C  atorvastatin (LIPITOR) 20 MG tablet Take 1 tablet (20 mg total) by mouth daily. 09/21/19   Ladell Pier, MD  benzonatate (TESSALON) 200 MG capsule Take 1 capsule (200 mg total) by mouth every 8 (eight) hours for 10 days. 03/25/20 04/04/20  Tacy Learn, PA-C  carvedilol (COREG) 3.125 MG tablet Take 1 tablet (3.125 mg total) by mouth 2 (two) times daily with a meal. 09/17/19   Ladell Pier, MD  hydrochlorothiazide (HYDRODIURIL) 25 MG tablet Take 1 tablet (25 mg total) by mouth daily. 05/22/19   Ladell Pier, MD  losartan (COZAAR) 100 MG tablet Take 1 tablet (100 mg total) by mouth daily. 02/19/19   Ladell Pier, MD  omeprazole (PRILOSEC) 20 MG capsule Take 1 capsule (20 mg total) by mouth daily as needed. 05/22/19   Ladell Pier, MD  PARoxetine (PAXIL) 10 MG tablet Take 1 tablet (10 mg total) by mouth daily. 05/22/19   Ladell Pier, MD  triamcinolone cream (KENALOG) 0.1 % Apply BID as needed to affected areas on extremities. Patient not taking: Reported on 01/17/2020 02/14/18   Ladell Pier, MD  vitamin B-12 (CYANOCOBALAMIN) 1000 MCG tablet Take 1 tablet (1,000 mcg total) by mouth daily. 09/10/18   Ladell Pier, MD    Allergies    Lidocaine, Lisinopril-hydrochlorothiazide, Metformin and related, Norvasc [amlodipine besylate], and Penicillins  Review of Systems   Review of Systems  Constitutional: Positive for chills. Negative for fever.  HENT: Positive for congestion.   Respiratory: Positive for cough. Negative for shortness of breath.   Cardiovascular: Negative for chest pain.  Gastrointestinal: Positive for diarrhea. Negative for abdominal pain, nausea and vomiting.  Genitourinary: Negative for difficulty urinating and dysuria.  Musculoskeletal: Positive for arthralgias and myalgias.  Skin: Negative for rash and wound.  Allergic/Immunologic: Negative for immunocompromised state.  Hematological: Negative for adenopathy.  All other systems  reviewed and are negative.   Physical Exam Updated Vital Signs BP (!) 143/86   Pulse 97   Temp 99 F (37.2 C) (Oral)   Resp 16   LMP 03/11/2020   SpO2 98%   Physical Exam Vitals and nursing note reviewed.  Constitutional:      General: She is not in acute distress.    Appearance: She is well-developed. She is not diaphoretic.  HENT:     Head: Normocephalic and atraumatic.  Cardiovascular:     Rate and Rhythm: Normal rate and regular rhythm.     Pulses: Normal pulses.     Heart sounds: Normal heart sounds.  Pulmonary:     Effort: Pulmonary effort is normal.  Breath sounds: Normal breath sounds.  Skin:    General: Skin is warm and dry.     Findings: No erythema or rash.  Neurological:     Mental Status: She is alert and oriented to person, place, and time.  Psychiatric:        Behavior: Behavior normal.     ED Results / Procedures / Treatments   Labs (all labs ordered are listed, but only abnormal results are displayed) Labs Reviewed  BASIC METABOLIC PANEL - Abnormal; Notable for the following components:      Result Value   Glucose, Bld 104 (*)    All other components within normal limits  SARS CORONAVIRUS 2 (TAT 6-24 HRS)  CBC    EKG None  Radiology DG Chest 2 View  Result Date: 03/25/2020 CLINICAL DATA:  Fever, chills, myalgias and diarrhea. EXAM: CHEST - 2 VIEW COMPARISON:  12/27/2016 FINDINGS: Normal sized heart. Clear lungs. Stable mild peribronchial thickening. Mild thoracic spine degenerative changes. IMPRESSION: No acute abnormality. Stable mild chronic bronchitic changes. Electronically Signed   By: Claudie Revering M.D.   On: 03/25/2020 16:22    Procedures Procedures (including critical care time)  Medications Ordered in ED Medications - No data to display  ED Course  I have reviewed the triage vital signs and the nursing notes.  Pertinent labs & imaging results that were available during my care of the patient were reviewed by me and  considered in my medical decision making (see chart for details).  Clinical Course as of Mar 25 1820  Tue Mar 25, 3630  [id number] 49 year old female presents with body aches, cough, congestion, diarrhea, loss of smell and taste.  Symptoms concerning for COVID-19 infection.  Labs including CBC and BMP without significant findings.  Vitals are reassuring including temp of 99, O2 sat 98% on room air without respiratory complaints.  Chest x-ray with bronchitic changes.  Patient be given prescription for Tessalon and albuterol.  Advised to quarantine, Covid test obtained.   [LM]    Clinical Course User Index [LM] Roque Lias   MDM Rules/Calculators/A&P                      Final Clinical Impression(s) / ED Diagnoses Final diagnoses:  Upper respiratory tract infection, unspecified type    Rx / DC Orders ED Discharge Orders         Ordered    benzonatate (TESSALON) 200 MG capsule  Every 8 hours     03/25/20 1805    albuterol (VENTOLIN HFA) 108 (90 Base) MCG/ACT inhaler  Every 4 hours PRN     03/25/20 1805           Tacy Learn, PA-C 03/25/20 1821    Milton Ferguson, MD 03/25/20 2228

## 2020-03-25 NOTE — Discharge Instructions (Signed)
Tessalon and albuterol as needed as directed for cough. Quarantine until 10 days after onset of symptoms.

## 2020-03-25 NOTE — ED Triage Notes (Signed)
Pt states she has had body aches and diarrhea several days ago. Pt states that the body aches have continued (diarrhea only x 1 day). Pt states she felt a chest and head cold. Pt c/o chills at night.   Pt had her first COVID shot, was due for 2nd one today.

## 2020-03-26 ENCOUNTER — Encounter: Payer: Self-pay | Admitting: Internal Medicine

## 2020-03-26 ENCOUNTER — Telehealth: Payer: Self-pay | Admitting: Unknown Physician Specialty

## 2020-03-26 MED FILL — ALBUTEROL SULFATE HFA 108 (: 108 (90 BAS | 16 days supply | Qty: 18 | Fill #0

## 2020-03-26 MED FILL — BENZONATATE 100 MG CAPS: 100 | 10 days supply | Qty: 60 | Fill #0

## 2020-03-26 NOTE — Telephone Encounter (Signed)
Called to discuss with Janet [name] about Covid symptoms and the use of bamlanivimab, a monoclonal antibody infusion for those with mild to moderate Covid symptoms and at a high risk of hospitalization.     Pt is qualified for this infusion at the [facility] infusion center due to co-morbid conditions and/or a member of an at-risk group, however declines infusion at this time. Symptoms tier reviewed as well as criteria for ending isolation.  Symptoms reviewed that would warrant ED/Hospital evaluation. Preventative practices reviewed. Patient verbalized understanding. Patient advised to call back if he decides that he does want to get infusion. Callback number to the infusion center given. Patient advised to go to Urgent care or ED with severe symptoms. Last date she would be eligible for infusion is 03/27/2020.    Patient Active Problem List   Diagnosis Date Noted  . Glaucoma suspect of both eyes 09/17/2019  . Personal history of malignant melanoma of skin 09/17/2019  . Prediabetes 02/19/2019  . OSA (obstructive sleep apnea) 01/28/2019  . Seborrheic dermatitis of scalp 06/15/2018  . Psoriasis 02/14/2018  . Vitamin B 12 deficiency 02/14/2018  . Vitamin D deficiency 02/14/2018  . Anxiety and depression 02/03/2015  . Hyperlipemia 02/03/2015  . Obesity 02/03/2015  . Anemia due to chronic blood loss - reports history of evaluation and told due to menorrhagia 05/30/2014  . 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 Essential hypertension 08/22/2007  . GERD 08/22/2007  . ABFND PAP SMEAR HGSIL 08/22/2007

## 2020-03-27 ENCOUNTER — Ambulatory Visit: Payer: HRSA Program | Attending: Critical Care Medicine | Admitting: Critical Care Medicine

## 2020-03-27 ENCOUNTER — Other Ambulatory Visit: Payer: Self-pay

## 2020-03-27 ENCOUNTER — Encounter: Payer: Self-pay | Admitting: Critical Care Medicine

## 2020-03-27 DIAGNOSIS — U071 COVID-19: Secondary | ICD-10-CM | POA: Insufficient documentation

## 2020-03-27 NOTE — Progress Notes (Signed)
Subjective:    Patient ID: Janet [name], female    DOB: 1971/02/12, 49 y.o.   MRN: [id number] Virtual Visit via Video Note  I connected with@ on 03/27/20 at@ by a video enabled telemedicine application and verified that I am speaking with the correct person using two identifiers.   Consent:  I discussed the limitations, risks, security and privacy concerns of performing an evaluation and management service by video visit and the availability of in person appointments. I also discussed with the patient that there may be a patient responsible charge related to this service. The patient expressed understanding and agreed to proceed.  Location of patient: The patient is at home  Location of provider: I am in my office  Persons participating in the televisit with the patient.   No one else on the call    History of Present Illness:  49 y.o.F with COVID +    This is a telemedicine video visit   this is a primary care patient of Dr. Wynetta Emery and presented to the ER on April 20 with body aches cough congestion diarrhea loss of smell and taste.  Her symptoms began on April 13.  She was tested positive for Covid on the April 20 ER visit.  She still is having diarrhea but actually is slowly improving symptoms.  She still has minimal cough.  She notes her diarrhea has slowly improved.  She has no headache or dizziness.  Her taste and smell is returning.  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  She actually did receive her first vaccine of Pfizer 4 weeks ago and was due to have another 1 soon.  She was called to see if she would be willing to take the monoclonal antibody infusion and she declined that.      Past Medical History:  Diagnosis Date  . ABFND PAP SMEAR HGSIL 08/22/2007  . Anemia due to chronic blood loss - reports history of evaluation and told due to menorrhagia 05/30/2014  . ANXIETY 08/22/2007  . Anxiety and depression   . Anxiety and  depression   . Frequent headaches   . GERD 08/22/2007  . Hyperglycemia   . Hyperlipemia   . HYPERTENSION, BENIGN ESSENTIAL 08/22/2007  . 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 Obesity 02/03/2015  . PLANTAR FASCIITIS, RIGHT 07/01/2008  . PSORIASIS 08/22/2007     Family History  Problem Relation Age of Onset  . Diabetes Mother   . Hypertension Mother   . Fibroids Mother   . Heart disease Father   . Hypertension Father   . Heart attack Father   . Cancer Father        liver cancer  . Diabetes Brother   . Hypertension Brother   . Diabetes Brother      Social History   Socioeconomic History  . Marital status: Single    Spouse name: Not on file  . Number of children: Not on file  . Years of education: Not on file  . Highest education level: Not on file  Occupational History  . Not on file  Tobacco Use  . Smoking status: Never Smoker  . Smokeless tobacco: Never Used  Substance and Sexual Activity  . Alcohol use: No  . Drug use: No  . Sexual activity: Never    Partners: Male    Birth control/protection: None  Other Topics Concern  . Not on file  Social History Narrative   Work or School: daycare in  infant room      Home Situation: lives alone      Spiritual Beliefs: none      Lifestyle: no regular exercise; diet is poor            Social Determinants of Radio broadcast assistant Strain:   . Difficulty of Paying Living Expenses:   Food Insecurity:   . Worried About Charity fundraiser in the Last Year:   . Arboriculturist in the Last Year:   Transportation Needs:   . Film/video editor (Medical):   Marland Kitchen Lack of Transportation (Non-Medical):   Physical Activity:   . Days of Exercise per Week:   . Minutes of Exercise per Session:   Stress:   . Feeling of Stress :   Social Connections:   . Frequency of Communication with Friends and Family:   . Frequency of Social Gatherings with Friends and Family:   . Attends Religious Services:   . Active Member of Clubs or  Organizations:   . Attends Archivist Meetings:   Marland Kitchen Marital Status:   Intimate Partner Violence:   . Fear of Current or Ex-Partner:   . Emotionally Abused:   Marland Kitchen Physically Abused:   . Sexually Abused:      Allergies  Allergen Reactions  . Lidocaine     ? Lidocaine allergy? Reports heart racing when has "shot of medicine in cevix" for leep  . Lisinopril-Hydrochlorothiazide     Leg cramps.  . Metformin And Related Diarrhea  . Norvasc [Amlodipine Besylate]     Leg pain  . Penicillins Itching     Outpatient Medications Prior to Visit  Medication Sig Dispense Refill  . albuterol (VENTOLIN HFA) 108 (90 Base) MCG/ACT inhaler Inhale 2 puffs into the lungs every 4 (four) hours as needed for wheezing or shortness of breath. 18 g 0  . atorvastatin (LIPITOR) 20 MG tablet Take 1 tablet (20 mg total) by mouth daily. 90 tablet 3  . benzonatate (TESSALON) 200 MG capsule Take 1 capsule (200 mg total) by mouth every 8 (eight) hours for 10 days. 30 capsule 0  . carvedilol (COREG) 3.125 MG tablet Take 1 tablet (3.125 mg total) by mouth 2 (two) times daily with a meal. 60 tablet 3  . hydrochlorothiazide (HYDRODIURIL) 25 MG tablet Take 1 tablet (25 mg total) by mouth daily. 90 tablet 2  . losartan (COZAAR) 100 MG tablet Take 1 tablet (100 mg total) by mouth daily. 30 tablet 6  . omeprazole (PRILOSEC) 20 MG capsule Take 1 capsule (20 mg total) by mouth daily as needed. 30 capsule 3  . PARoxetine (PAXIL) 10 MG tablet Take 1 tablet (10 mg total) by mouth daily. 30 tablet 5  . vitamin B-12 (CYANOCOBALAMIN) 1000 MCG tablet Take 1 tablet (1,000 mcg total) by mouth daily. 100 tablet 1  . albuterol (PROVENTIL) (2.5 MG/3ML) 0.083% nebulizer solution Take 6 mLs (5 mg total) by nebulization every 4 (four) hours as needed for wheezing. (Patient not taking: Reported on 03/27/2020) 75 mL 0  . triamcinolone cream (KENALOG) 0.1 % Apply BID as needed to affected areas on extremities. (Patient not taking: Reported  on 01/17/2020) 45 g 1   No facility-administered medications prior to visit.    Review of Systems  Constitutional: Positive for fatigue and fever.  HENT: Positive for postnasal drip, sinus pain and sore throat.   Respiratory: Positive for cough and shortness of breath.   Gastrointestinal: Positive for diarrhea.  Neurological:  Positive for dizziness, light-headedness and headaches.  Psychiatric/Behavioral: Negative.        Objective:   Physical Exam No exam as this is a video visit However I can see the patient on the video and she is in no acute distress     Assessment & Plan:  I personally reviewed all images and lab data in the [facility] system as well as any outside material available during this office visit and agree with the  radiology impressions.   COVID-19 virus infection COVID-19 viral infection slowly resolving No indication for monoclonal antibody infusion Status post Covid vaccine x1 dose 4 weeks ago  Patient will need to delay her second vaccine and will need to stay out of work until we can have another video visit with her the first of next week  Patient continue vitamin D supplementation and continue hydration     Diagnoses and all orders for this visit:  COVID-19 virus infection    Follow Up Instructions: The patient knows a follow-up video visit will occur within a week and she is to stay out of work during that time a work note was given to the patient the patient already is taking vitamin D supplements at this time.  She does not appear to need the monoclonal antibody infusion at this time.   I discussed the assessment and treatment plan with the patient. The patient was provided an opportunity to ask questions and all were answered. The patient agreed with the plan and demonstrated an understanding of the instructions.   The patient was advised to call back or seek an in-person evaluation if the symptoms worsen or if the condition fails to improve as  anticipated.  I provided 30 minutes of non-face-to-face time during this encounter  including  median intraservice time , review of notes, labs, imaging, medications  and explaining diagnosis and management to the patient .    Asencion Noble, MD

## 2020-03-27 NOTE — Assessment & Plan Note (Signed)
COVID-19 viral infection slowly resolving No indication for monoclonal antibody infusion Status post Covid vaccine x1 dose 4 weeks ago  Patient will need to delay her second vaccine and will need to stay out of work until we can have another video visit with her the first of next week  Patient continue vitamin D supplementation and continue hydration

## 2020-03-27 NOTE — Progress Notes (Signed)
COVID 19 f/u, per pt she is still having Fatigue, cough

## 2020-03-28 ENCOUNTER — Telehealth: Payer: Self-pay | Admitting: *Deleted

## 2020-03-28 NOTE — Telephone Encounter (Signed)
TOC CM received call from pt stating on her AVS it is showing albuterol neb solution but she did not receive a RX. TOC CM verified with ED provider, Suella Broad PA and pt was only prescribed albuterol inhaler. She would need to continue to follow up with her PCP. [place], [place] ED TOC CM [phone]

## 2020-03-31 ENCOUNTER — Encounter: Payer: Self-pay | Admitting: Critical Care Medicine

## 2020-03-31 ENCOUNTER — Telehealth (HOSPITAL_BASED_OUTPATIENT_CLINIC_OR_DEPARTMENT_OTHER): Payer: HRSA Program | Admitting: Critical Care Medicine

## 2020-03-31 DIAGNOSIS — U071 COVID-19: Secondary | ICD-10-CM | POA: Diagnosis not present

## 2020-03-31 NOTE — Progress Notes (Signed)
Subjective:    Patient ID: Janet [name], female    DOB: 10-22-1971, 49 y.o.   MRN: [id number] Virtual Visit via Video Note  I connected with@ on 03/31/20 at@ by a video enabled telemedicine application and verified that I am speaking with the correct person using two identifiers.   Consent:  I discussed the limitations, risks, security and privacy concerns of performing an evaluation and management service by video visit and the availability of in person appointments. I also discussed with the patient that there may be a patient responsible charge related to this service. The patient expressed understanding and agreed to proceed.  Location of patient: The patient is at home  Location of provider: I am in my office  Persons participating in the televisit with the patient.   No one else on the call    History of Present Illness:  49 y.o.F with COVID +    This is a telemedicine video visit   this is a primary care patient of Dr. Wynetta Emery and presented to the ER on April 20 with body aches cough congestion diarrhea loss of smell and taste.  Her symptoms began on April 13.  She was tested positive for Covid on the April 20 ER visit.  She still is having diarrhea but actually is slowly improving symptoms.  She still has minimal cough.  She notes her diarrhea has slowly improved.  She has no headache or dizziness.  Her taste and smell is returning.  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  She actually did receive her first vaccine of Pfizer 4 weeks ago and was due to have another 1 soon.  She was called to see if she would be willing to take the monoclonal antibody infusion and she declined that.   03/31/2020 The patient is seen again today by way of a telemedicine visit and she is markedly better.  She has minimal cough and only has fatigue and no other symptoms are present.  No further gastrointestinal complaints are noted.  She is having no headache or  dizziness.  She is anxious to work again and works in childcare   Past Medical History:  Diagnosis Date  . ABFND PAP SMEAR HGSIL 08/22/2007  . Anemia due to chronic blood loss - reports history of evaluation and told due to menorrhagia 05/30/2014  . ANXIETY 08/22/2007  . Anxiety and depression   . Anxiety and depression   . Frequent headaches   . GERD 08/22/2007  . Hyperglycemia   . Hyperlipemia   . HYPERTENSION, BENIGN ESSENTIAL 08/22/2007  . 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 Obesity 02/03/2015  . PLANTAR FASCIITIS, RIGHT 07/01/2008  . PSORIASIS 08/22/2007     Family History  Problem Relation Age of Onset  . Diabetes Mother   . Hypertension Mother   . Fibroids Mother   . Heart disease Father   . Hypertension Father   . Heart attack Father   . Cancer Father        liver cancer  . Diabetes Brother   . Hypertension Brother   . Diabetes Brother      Social History   Socioeconomic History  . Marital status: Single    Spouse name: Not on file  . Number of children: Not on file  . Years of education: Not on file  . Highest education level: Not on file  Occupational History  . Not on file  Tobacco Use  . Smoking status: Never  Smoker  . Smokeless tobacco: Never Used  Substance and Sexual Activity  . Alcohol use: No  . Drug use: No  . Sexual activity: Never    Partners: Male    Birth control/protection: None  Other Topics Concern  . Not on file  Social History Narrative   Work or School: daycare in infant room      Home Situation: lives alone      Spiritual Beliefs: none      Lifestyle: no regular exercise; diet is poor            Social Determinants of Radio broadcast assistant Strain:   . Difficulty of Paying Living Expenses:   Food Insecurity:   . Worried About Charity fundraiser in the Last Year:   . Arboriculturist in the Last Year:   Transportation Needs:   . Film/video editor (Medical):   Marland Kitchen Lack of Transportation (Non-Medical):   Physical  Activity:   . Days of Exercise per Week:   . Minutes of Exercise per Session:   Stress:   . Feeling of Stress :   Social Connections:   . Frequency of Communication with Friends and Family:   . Frequency of Social Gatherings with Friends and Family:   . Attends Religious Services:   . Active Member of Clubs or Organizations:   . Attends Archivist Meetings:   Marland Kitchen Marital Status:   Intimate Partner Violence:   . Fear of Current or Ex-Partner:   . Emotionally Abused:   Marland Kitchen Physically Abused:   . Sexually Abused:      Allergies  Allergen Reactions  . Lidocaine     ? Lidocaine allergy? Reports heart racing when has "shot of medicine in cevix" for leep  . Lisinopril-Hydrochlorothiazide     Leg cramps.  . Metformin And Related Diarrhea  . Norvasc [Amlodipine Besylate]     Leg pain  . Penicillins Itching     Outpatient Medications Prior to Visit  Medication Sig Dispense Refill  . albuterol (PROVENTIL) (2.5 MG/3ML) 0.083% nebulizer solution Take 6 mLs (5 mg total) by nebulization every 4 (four) hours as needed for wheezing. 75 mL 0  . albuterol (VENTOLIN HFA) 108 (90 Base) MCG/ACT inhaler Inhale 2 puffs into the lungs every 4 (four) hours as needed for wheezing or shortness of breath. 18 g 0  . atorvastatin (LIPITOR) 20 MG tablet Take 1 tablet (20 mg total) by mouth daily. 90 tablet 3  . benzonatate (TESSALON) 200 MG capsule Take 1 capsule (200 mg total) by mouth every 8 (eight) hours for 10 days. 30 capsule 0  . carvedilol (COREG) 3.125 MG tablet Take 1 tablet (3.125 mg total) by mouth 2 (two) times daily with a meal. 60 tablet 3  . hydrochlorothiazide (HYDRODIURIL) 25 MG tablet Take 1 tablet (25 mg total) by mouth daily. 90 tablet 2  . losartan (COZAAR) 100 MG tablet Take 1 tablet (100 mg total) by mouth daily. 30 tablet 6  . omeprazole (PRILOSEC) 20 MG capsule Take 1 capsule (20 mg total) by mouth daily as needed. 30 capsule 3  . PARoxetine (PAXIL) 10 MG tablet Take 1 tablet  (10 mg total) by mouth daily. 30 tablet 5  . triamcinolone cream (KENALOG) 0.1 % Apply BID as needed to affected areas on extremities. 45 g 1  . vitamin B-12 (CYANOCOBALAMIN) 1000 MCG tablet Take 1 tablet (1,000 mcg total) by mouth daily. 100 tablet 1   No facility-administered  medications prior to visit.    Review of Systems  Constitutional: Positive for fatigue and fever.  HENT: Positive for postnasal drip, sinus pain and sore throat.   Respiratory: Positive for cough and shortness of breath.   Gastrointestinal: Positive for diarrhea.  Neurological: Positive for dizziness, light-headedness and headaches.  Psychiatric/Behavioral: Negative.        Objective:   Physical Exam No exam as this is a video visit However I can see the patient on the video and she is in no acute distress     Assessment & Plan:  I personally reviewed all images and lab data in the [facility] system as well as any outside material available during this office visit and agree with the  radiology impressions.   COVID-19 virus infection COVID-19 infection that occurred between the first and second [place] vaccine  The patient has resolved her symptoms and may be released to work on April 28 and be out of isolation  The patient may receive her second Covid vaccine the week of May 3   Diagnoses and all orders for this visit:  COVID-19 virus infection    Follow Up Instructions: The patient knows she can be cleared to return to work on April 28 and a letter will be sent to her email address for clearance, I also advised that she can receive her Covid vaccine next dose on the week of May 3  I discussed the assessment and treatment plan with the patient. The patient was provided an opportunity to ask questions and all were answered. The patient agreed with the plan and demonstrated an understanding of the instructions.   The patient was advised to call back or seek an in-person evaluation if the symptoms worsen or  if the condition fails to improve as anticipated.  I provided 30 minutes of non-face-to-face time during this encounter  including  median intraservice time , review of notes, labs, imaging, medications  and explaining diagnosis and management to the patient .    Asencion Noble, MD

## 2020-03-31 NOTE — Assessment & Plan Note (Signed)
COVID-19 infection that occurred between the first and second [place] vaccine  The patient has resolved her symptoms and may be released to work on April 28 and be out of isolation  The patient may receive her second Covid vaccine the week of May 3

## 2020-04-09 ENCOUNTER — Ambulatory Visit: Payer: No Typology Code available for payment source | Attending: Internal Medicine

## 2020-04-09 DIAGNOSIS — Z23 Encounter for immunization: Secondary | ICD-10-CM

## 2020-04-09 NOTE — Progress Notes (Signed)
   Covid-19 Vaccination Clinic  Name:  Janet [name]    MRN: [id number] DOB: [birth year]  04/09/2020  [name] was observed post Covid-19 immunization for 15 minutes without incident. She was provided with Vaccine Information Sheet and instruction to access the V-Safe system.   [name] was instructed to call 911 with any severe reactions post vaccine: Marland Kitchen Difficulty breathing  . Swelling of face and throat  . A fast heartbeat  . A bad rash all over body  . Dizziness and weakness   Immunizations Administered    Name Date Dose VIS Date Route   Pfizer COVID-19 Vaccine 04/09/2020  3:48 PM 0.3 mL 01/30/2019 Intramuscular   Manufacturer: [place]   Lot: [id number]   [place]: [id number]

## 2020-04-24 ENCOUNTER — Other Ambulatory Visit: Payer: Self-pay | Admitting: Internal Medicine

## 2020-04-24 DIAGNOSIS — I10 Essential (primary) hypertension: Secondary | ICD-10-CM

## 2020-04-24 MED FILL — LOSARTAN POTASSIUM 100 MG T: 100 | 30 days supply | Qty: 30 | Fill #0

## 2020-05-08 ENCOUNTER — Encounter (HOSPITAL_COMMUNITY): Payer: Self-pay

## 2020-05-08 ENCOUNTER — Emergency Department (HOSPITAL_COMMUNITY)
Admission: EM | Admit: 2020-05-08 | Discharge: 2020-05-09 | Disposition: A | Discharge status: Home / Self Care | Payer: No Typology Code available for payment source | Attending: Emergency Medicine | Admitting: Emergency Medicine

## 2020-05-08 ENCOUNTER — Other Ambulatory Visit: Payer: Self-pay

## 2020-05-08 DIAGNOSIS — I10 Essential (primary) hypertension: Secondary | ICD-10-CM | POA: Insufficient documentation

## 2020-05-08 DIAGNOSIS — Z79899 Other long term (current) drug therapy: Secondary | ICD-10-CM | POA: Insufficient documentation

## 2020-05-08 DIAGNOSIS — M109 Gout, unspecified: Secondary | ICD-10-CM

## 2020-05-08 NOTE — ED Triage Notes (Signed)
Pt arrives to ED w/ c/o R foot pain, which she states is gout. Pt reports 9/10 pain. Pt has been taking advil w/ mild relief of pain.

## 2020-05-09 MED ORDER — COLCHICINE 0.6 MG PO TABS: ORAL_TABLET | ORAL | 0 refills | Status: DC

## 2020-05-09 MED ORDER — HYDROCODONE-ACETAMINOPHEN 5-325 MG PO TABS: 1.0000 | ORAL_TABLET | ORAL | 0 refills | Status: DC | PRN

## 2020-05-09 NOTE — ED Notes (Signed)
Pt verbalized understanding of d/c instructions, follow up care and s/s requiring return to ed. Pt had no further questions.  

## 2020-05-09 NOTE — Discharge Instructions (Signed)
Elevate the foot to reduce swelling. Take medications as prescribed.  Keep your appointment with Dr. Wynetta Emery for further evaluation of what is most likely a gouty arthritis attack.

## 2020-05-09 NOTE — ED Provider Notes (Signed)
[facility] EMERGENCY DEPARTMENT Provider Note   CSN: [id number] Arrival date & time: 05/08/20  2103     History Chief Complaint  Patient presents with  . Gout    Janet [name] is a 49 y.o. female.  Patient to ED with pain and swelling of her right foot for the past several days. No injury. The pain is located at the 1st toe joint. She has taken Advil with limited relief. She states it has become significantly tender to the touch. No history of gout.   The history is provided by the patient. No language interpreter was used.       Past Medical History:  Diagnosis Date  . ABFND PAP SMEAR HGSIL 08/22/2007  . Anemia due to chronic blood loss - reports history of evaluation and told due to menorrhagia 05/30/2014  . ANXIETY 08/22/2007  . Anxiety and depression   . Anxiety and depression   . Frequent headaches   . GERD 08/22/2007  . Hyperglycemia   . Hyperlipemia   . HYPERTENSION, BENIGN ESSENTIAL 08/22/2007  . 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 Obesity 02/03/2015  . PLANTAR FASCIITIS, RIGHT 07/01/2008  . PSORIASIS 08/22/2007    Patient Active Problem List   Diagnosis Date Noted  . COVID-19 virus infection 03/27/2020  . Glaucoma suspect of both eyes 09/17/2019  . Personal history of malignant melanoma of skin 09/17/2019  . Prediabetes 02/19/2019  . OSA (obstructive sleep apnea) 01/28/2019  . Seborrheic dermatitis of scalp 06/15/2018  . Psoriasis 02/14/2018  . Vitamin B 12 deficiency 02/14/2018  . Vitamin D deficiency 02/14/2018  . Anxiety and depression 02/03/2015  . Hyperlipemia 02/03/2015  . Obesity 02/03/2015  . Anemia due to chronic blood loss - reports history of evaluation and told due to menorrhagia 05/30/2014  . 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 Essential hypertension 08/22/2007  . GERD 08/22/2007  . ABFND PAP SMEAR HGSIL 08/22/2007    Past Surgical History:  Procedure Laterality Date  . LEEP  2009   CIN I negative margin  . SKIN CANCER  EXCISION  2010   chest area     OB History    Gravida  1   Para  1   Term  1   Preterm      AB      Living  1     SAB      TAB      Ectopic      Multiple      Live Births  1           Family History  Problem Relation Age of Onset  . Diabetes Mother   . Hypertension Mother   . Fibroids Mother   . Heart disease Father   . Hypertension Father   . Heart attack Father   . Cancer Father        liver cancer  . Diabetes Brother   . Hypertension Brother   . Diabetes Brother     Social History   Tobacco Use  . Smoking status: Never Smoker  . Smokeless tobacco: Never Used  Substance Use Topics  . Alcohol use: No  . Drug use: No    Home Medications Prior to Admission medications   Medication Sig Start Date End Date Taking? Authorizing Provider  albuterol (PROVENTIL) (2.5 MG/3ML) 0.083% nebulizer solution Take 6 mLs (5 mg total) by nebulization every 4 (four) hours as needed for wheezing. 12/27/16   Carlisle Cater, PA-C  albuterol (VENTOLIN HFA) 108 (  90 Base) MCG/ACT inhaler Inhale 2 puffs into the lungs every 4 (four) hours as needed for wheezing or shortness of breath. 03/25/20   Tacy Learn, PA-C  atorvastatin (LIPITOR) 20 MG tablet Take 1 tablet (20 mg total) by mouth daily. 09/21/19   Ladell Pier, MD  carvedilol (COREG) 3.125 MG tablet Take 1 tablet (3.125 mg total) by mouth 2 (two) times daily with a meal. 09/17/19   Ladell Pier, MD  hydrochlorothiazide (HYDRODIURIL) 25 MG tablet Take 1 tablet (25 mg total) by mouth daily. 05/22/19   Ladell Pier, MD  losartan (COZAAR) 100 MG tablet TAKE 1 TABLET (100 MG TOTAL) BY MOUTH DAILY. 04/24/20   Ladell Pier, MD  omeprazole (PRILOSEC) 20 MG capsule Take 1 capsule (20 mg total) by mouth daily as needed. 05/22/19   Ladell Pier, MD  PARoxetine (PAXIL) 10 MG tablet Take 1 tablet (10 mg total) by mouth daily. 05/22/19   Ladell Pier, MD  triamcinolone cream (KENALOG) 0.1 % Apply  BID as needed to affected areas on extremities. 02/14/18   Ladell Pier, MD  vitamin B-12 (CYANOCOBALAMIN) 1000 MCG tablet Take 1 tablet (1,000 mcg total) by mouth daily. 09/10/18   Ladell Pier, MD    Allergies    Lidocaine, Lisinopril-hydrochlorothiazide, Metformin and related, Norvasc [amlodipine besylate], and Penicillins  Review of Systems   Review of Systems  Constitutional: Negative for fever.  Gastrointestinal: Negative for nausea.  Musculoskeletal:       See hpi.  Skin: Positive for color change.  Neurological: Negative for weakness and numbness.    Physical Exam Updated Vital Signs BP 113/62 (BP Location: Right Arm)   Pulse 93   Temp 98.7 F (37.1 C) (Oral)   Resp 16   SpO2 97%   Physical Exam Vitals and nursing note reviewed.  Constitutional:      Appearance: She is well-developed.  Pulmonary:     Effort: Pulmonary effort is normal.  Musculoskeletal:     Cervical back: Normal range of motion.     Comments: Right foot has mild erythema surrounding the 1st MTP joint. Significant tenderness. No skin breakdown, wound.   Skin:    General: Skin is warm and dry.  Neurological:     Mental Status: She is alert and oriented to person, place, and time.     ED Results / Procedures / Treatments   Labs (all labs ordered are listed, but only abnormal results are displayed) Labs Reviewed - No data to display  EKG None  Radiology No results found.  Procedures Procedures (including critical care time)  Medications Ordered in ED Medications - No data to display  ED Course  I have reviewed the triage vital signs and the nursing notes.  Pertinent labs & imaging results that were available during my care of the patient were reviewed by me and considered in my medical decision making (see chart for details).    MDM Rules/Calculators/A&P                      Patient to ED with pain and swelling of the right 1st MTP. No history of gout. No fever or  injury.   DDx: gout vs early cellulitis. Findings on exam and location of pain are consistent with gout. Will give colchicine for total dose of 1.2 mg and encourage follow up with her doctor for recheck this week. Discussed with the patient that there should be improvement with this  medication but if there is no improvement it is important for her doctor to re-examine and treat further as indicated.   Final Clinical Impression(s) / ED Diagnoses Final diagnoses:  None   1. Gout   Rx / DC Orders ED Discharge Orders    None       Dennie Bible 05/13/20 0309    Palumbo, April, MD 05/13/20 2313

## 2020-05-15 ENCOUNTER — Other Ambulatory Visit: Payer: Self-pay | Admitting: Internal Medicine

## 2020-05-15 DIAGNOSIS — I10 Essential (primary) hypertension: Secondary | ICD-10-CM

## 2020-05-15 MED FILL — ?ATORVASTATIN 20 MG TABLET: 20 | 30 days supply | Qty: 30 | Fill #5

## 2020-05-16 MED FILL — ?CARVEDILOL 3.125 MG TABLET: 3.125 | 30 days supply | Qty: 60 | Fill #0

## 2020-05-21 ENCOUNTER — Other Ambulatory Visit: Payer: Self-pay

## 2020-05-21 DIAGNOSIS — Z1231 Encounter for screening mammogram for malignant neoplasm of breast: Secondary | ICD-10-CM

## 2020-05-22 ENCOUNTER — Telehealth: Payer: Self-pay | Admitting: Internal Medicine

## 2020-05-22 NOTE — Telephone Encounter (Signed)
Pt was seen at the [facility] ED on 05/08/20 for gout. Pt was prescribed Colchicine 0.6mg  but only 2 tablets

## 2020-05-22 NOTE — Telephone Encounter (Signed)
Patient called saying she has gout and would like to be prescribed medication. Please f/u

## 2020-05-23 NOTE — Telephone Encounter (Signed)
Pt has an appointment with pcp on Monday June 21 at 210pm

## 2020-05-26 ENCOUNTER — Ambulatory Visit: Payer: Self-pay | Attending: Internal Medicine | Admitting: Family

## 2020-05-26 ENCOUNTER — Other Ambulatory Visit: Payer: Self-pay

## 2020-05-26 DIAGNOSIS — M109 Gout, unspecified: Secondary | ICD-10-CM

## 2020-05-26 MED ORDER — PREDNISONE 10 MG PO TABS: ORAL_TABLET | ORAL | 0 refills | Status: AC

## 2020-05-26 MED FILL — ?PREDINSONE 10MG TABLETS: 10 | 8 days supply | Qty: 20 | Fill #0

## 2020-05-26 NOTE — Patient Instructions (Signed)
Prednisone for management of gout. Follow-up with primary physician in 1 month or sooner if needed. Gout  Gout is painful swelling of your joints. Gout is a type of arthritis. It is caused by having too much uric acid in your body. Uric acid is a chemical that is made when your body breaks down substances called purines. If your body has too much uric acid, sharp crystals can form and build up in your joints. This causes pain and swelling. Gout attacks can happen quickly and be very painful (acute gout). Over time, the attacks can affect more joints and happen more often (chronic gout). What are the causes?  Too much uric acid in your blood. This can happen because: ? Your kidneys do not remove enough uric acid from your blood. ? Your body makes too much uric acid. ? You eat too many foods that are high in purines. These foods include organ meats, some seafood, and beer.  Trauma or stress. What increases the risk?  Having a family history of gout.  Being female and middle-aged.  Being female and having gone through menopause.  Being very overweight (obese).  Drinking alcohol, especially beer.  Not having enough water in the body (being dehydrated).  Losing weight too quickly.  Having an organ transplant.  Having lead poisoning.  Taking certain medicines.  Having kidney disease.  Having a skin condition called psoriasis. What are the signs or symptoms? An attack of acute gout usually happens in just one joint. The most common place is the big toe. Attacks often start at night. Other joints that may be affected include joints of the feet, ankle, knee, fingers, wrist, or elbow. Symptoms of an attack may include:  Very bad pain.  Warmth.  Swelling.  Stiffness.  Shiny, red, or purple skin.  Tenderness. The affected joint may be very painful to touch.  Chills and fever. Chronic gout may cause symptoms more often. More joints may be involved. You may also have white or  yellow lumps (tophi) on your hands or feet or in other areas near your joints. How is this treated?  Treatment for this condition has two phases: treating an acute attack and preventing future attacks.  Acute gout treatment may include: ? NSAIDs. ? Steroids. These are taken by mouth or injected into a joint. ? Colchicine. This medicine relieves pain and swelling. It can be given by mouth or through an IV tube.  Preventive treatment may include: ? Taking small doses of NSAIDs or colchicine daily. ? Using a medicine that reduces uric acid levels in your blood. ? Making changes to your diet. You may need to see a food expert (dietitian) about what to eat and drink to prevent gout. Follow these instructions at home: During a gout attack   If told, put ice on the painful area: ? Put ice in a plastic bag. ? Place a towel between your skin and the bag. ? Leave the ice on for 20 minutes, 2-3 times a day.  Raise (elevate) the painful joint above the level of your heart as often as you can.  Rest the joint as much as possible. If the joint is in your leg, you may be given crutches.  Follow instructions from your doctor about what you cannot eat or drink. Avoiding future gout attacks  Eat a low-purine diet. Avoid foods and drinks such as: ? Liver. ? Kidney. ? Anchovies. ? Asparagus. ? Herring. ? Mushrooms. ? Mussels. ? Beer.  Stay at a  healthy weight. If you want to lose weight, talk with your doctor. Do not lose weight too fast.  Start or continue an exercise plan as told by your doctor. Eating and drinking  Drink enough fluids to keep your pee (urine) pale yellow.  If you drink alcohol: ? Limit how much you use to:  0-1 drink a day for women.  0-2 drinks a day for men. ? Be aware of how much alcohol is in your drink. In the [place] one drink equals one 12 oz bottle of beer (355 mL), one 5 oz glass of wine (148 mL), or one 1 oz glass of hard liquor (44 mL). General  instructions  Take over-the-counter and prescription medicines only as told by your doctor.  Do not drive or use heavy machinery while taking prescription pain medicine.  Return to your normal activities as told by your doctor. Ask your doctor what activities are safe for you.  Keep all follow-up visits as told by your doctor. This is important. Contact a doctor if:  You have another gout attack.  You still have symptoms of a gout attack after 10 days of treatment.  You have problems (side effects) because of your medicines.  You have chills or a fever.  You have burning pain when you pee (urinate).  You have pain in your lower back or belly. Get help right away if:  You have very bad pain.  Your pain cannot be controlled.  You cannot pee. Summary  Gout is painful swelling of the joints.  The most common site of pain is the big toe, but it can affect other joints.  Medicines and avoiding some foods can help to prevent and treat gout attacks. This information is not intended to replace advice given to you by your health care provider. Make sure you discuss any questions you have with your health care provider. Document Revised: 06/14/2018 Document Reviewed: 06/14/2018 Elsevier Patient Education  [place].

## 2020-05-26 NOTE — Progress Notes (Signed)
Virtual Visit via Telephone Note  I connected with Janet [name], on 05/26/2020 at 2:33 PM by telephone due to the COVID-19 pandemic and verified that I am speaking with the correct person using two identifiers.  Due to current restrictions/limitations of in-office visits due to the COVID-19 pandemic, this scheduled clinical appointment was converted to a telehealth visit.   Consent: I discussed the limitations, risks, security and privacy concerns of performing an evaluation and management service by telephone and the availability of in person appointments. I also discussed with the patient that there may be a patient responsible charge related to this service. The patient expressed understanding and agreed to proceed.  Location of Patient: Home  Location of Provider: [place]  Persons participating in Telemedicine visit: [place], NP Orlan Leavens, CMA  History of Present Illness: Janet [name] is a 49 year-old female with history of essential hypertension, obstructive sleep apnea, GERD, psoriasis, anemia due to chronic blood loss, anxiety and depression, hyperlipidemia, vitamin B12 deficiency, prediabetes, and Covid-19 virus infection who presents today for concerns of gout.   1. ER FOLLOW UP: Time since discharge: 17 days Hospital/facility: [facility] emergency department Diagnosis: acute gout involving toe of right foot, unspecified cause Procedures/tests: none Consultants: none New medications: Colchicine Discharge instructions:  Follow-up with PCP in 1 week to assess gout versus early cellulitis Status: stable  Last visit 05/08/2020 at the [facility] emergency department with Dr. Randal Buba. During that encounter it was determined patient had gout versus early cellulitis. Findings on exam and location of pain were consistent with gout. Patient given Colchicine for total dose of 1.2 mg and encouraged to  follow-up with her PCP for recheck the same week.   2. GOUT:  Duration:weeks Right 1st metatarsophalangeal pain: yes Left 1st metatarsophalangeal pain: no Right knee pain: no Left knee pain: no Severity: 8/10  Quality: aching Swelling: yes Redness: yes Trauma: no Recent dietary change or indiscretion: no, reports she does eat red meat quite often Fevers: no Nausea/vomiting: no Aggravating factors: denies Alleviating factors: denies Status:  stable Treatments attempted: Colchicine Reports several weeks ago woke up and right big toe felt like toothache, top of foot was red, and ball of foot felt like a toothache. Reports after enduring pain for 1.5 week she went to the emergency department and the doctor there said her foot had heat in it and appeared to be gout.   Past Medical History:  Diagnosis Date  . ABFND PAP SMEAR HGSIL 08/22/2007  . Anemia due to chronic blood loss - reports history of evaluation and told due to menorrhagia 05/30/2014  . ANXIETY 08/22/2007  . Anxiety and depression   . Anxiety and depression   . Frequent headaches   . GERD 08/22/2007  . Hyperglycemia   . Hyperlipemia   . HYPERTENSION, BENIGN ESSENTIAL 08/22/2007  . 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 Obesity 02/03/2015  . PLANTAR FASCIITIS, RIGHT 07/01/2008  . PSORIASIS 08/22/2007   Allergies  Allergen Reactions  . Lidocaine Hypertension    ? Lidocaine allergy? Reports heart racing when has "shot of medicine in cevix" for leep  . Lisinopril-Hydrochlorothiazide Other (See Comments)    Leg cramps.  . Metformin And Related Diarrhea  . Norvasc [Amlodipine Besylate] Other (See Comments)    Leg pain  . Penicillins Itching    Current Outpatient Medications on File Prior to Visit  Medication Sig Dispense Refill  . albuterol (PROVENTIL) (2.5 MG/3ML) 0.083% nebulizer solution  Take 6 mLs (5 mg total) by nebulization every 4 (four) hours as needed for wheezing. 75 mL 0  . albuterol (VENTOLIN HFA) 108 (90 Base)  MCG/ACT inhaler Inhale 2 puffs into the lungs every 4 (four) hours as needed for wheezing or shortness of breath. 18 g 0  . atorvastatin (LIPITOR) 20 MG tablet Take 1 tablet (20 mg total) by mouth daily. 90 tablet 3  . carvedilol (COREG) 3.125 MG tablet TAKE 1 TABLET (3.125 MG TOTAL) BY MOUTH 2 (TWO) TIMES DAILY WITH A MEAL. 60 tablet 0  . colchicine 0.6 MG tablet Take one tablet tonight. If no improvement in pain after one hour repeat dose. 2 tablet 0  . hydrochlorothiazide (HYDRODIURIL) 25 MG tablet Take 1 tablet (25 mg total) by mouth daily. 90 tablet 2  . HYDROcodone-acetaminophen (NORCO/VICODIN) 5-325 MG tablet Take 1 tablet by mouth every 4 (four) hours as needed for severe pain. 8 tablet 0  . losartan (COZAAR) 100 MG tablet TAKE 1 TABLET (100 MG TOTAL) BY MOUTH DAILY. 30 tablet 6  . omeprazole (PRILOSEC) 20 MG capsule Take 1 capsule (20 mg total) by mouth daily as needed. 30 capsule 3  . PARoxetine (PAXIL) 10 MG tablet Take 1 tablet (10 mg total) by mouth daily. 30 tablet 5  . triamcinolone cream (KENALOG) 0.1 % Apply BID as needed to affected areas on extremities. 45 g 1  . vitamin B-12 (CYANOCOBALAMIN) 1000 MCG tablet Take 1 tablet (1,000 mcg total) by mouth daily. 100 tablet 1   No current facility-administered medications on file prior to visit.    Observations/Objective: Alert and oriented x 3. Not in acute distress. Physical examination not completed as this is a telemedicine visit.  Assessment and Plan: 1. Acute gout involving toe of right foot, unspecified cause: -Today patient reports right foot great toe still causing pain and has swelling. Last visit 05/08/2020 at the [facility] emergency department for gout. Reports pain and swelling about the same since discharge from hospital. Reports she does have a diet which consists of red meat. Was discharged home on Colchicine and advised to follow-up with her primary physician in 1 week for evaluation of gout versus early  cellulitis -Prednisone taper for management of gout.  -Will not order Colchicine for management of gout. Patient taking Carvedilol and Atorvastatin which are contraindicated in combination with Colchicine. -Uric acid to evaluate for severity of gout.  -Last creatinine 0.71 on 03/25/2020.  -Follow-up with primary physician in 1 month or sooner if needed. Patient will need to come in for office visit for assessment if gout continues to flare. Patient verbalizes understanding. -Patient was given clear instructions to go to Emergency Department or return to medical center if symptoms don't improve, worsen, or new problems develop.The patient verbalized understanding. - predniSONE (DELTASONE) 10 MG tablet; Take 4 tablets (40 mg total) by mouth daily with breakfast for 2 days, THEN 3 tablets (30 mg total) daily with breakfast for 2 days, THEN 2 tablets (20 mg total) daily with breakfast for 2 days, THEN 1 tablet (10 mg total) daily with breakfast for 2 days.  Dispense: 20 tablet; Refill: 0 - Uric Acid   Follow Up Instructions: Follow-up with primary physician in 1 month or sooner if needed.   Patient was given clear instructions to go to Emergency Department or return to medical center if symptoms don't improve, worsen, or new problems develop.The patient verbalized understanding.  I discussed the assessment and treatment plan with the patient. The patient  was provided an opportunity to ask questions and all were answered. The patient agreed with the plan and demonstrated an understanding of the instructions.   The patient was advised to call back or seek an in-person evaluation if the symptoms worsen or if the condition fails to improve as anticipated.   I provided 10 minutes total of non-face-to-face time during this encounter including median intraservice time, reviewing previous notes, labs, imaging, medications, management and patient verbalized understanding.    Camillia Herter, NP  [facility] [place], [place]   05/26/2020, 2:17 PM

## 2020-05-27 LAB — URIC ACID: Uric Acid: 8.3 mg/dL — ABNORMAL HIGH (ref 2.6–6.2)

## 2020-05-27 NOTE — Progress Notes (Signed)
Uric acid elevated which suggests you may have gout.   Uric acid is a way to measure inflammation in the body.   Limit foods and drinks high in purines such as alcohol, seafood, and red meat.  Complete Prednisone taper and follow-up with primary physician as needed.   Explained to patient during telemedicine visit that she will need to come in for an office visit if this continues to repeat and/or progress. Patient verbalized understanding.

## 2020-06-12 ENCOUNTER — Ambulatory Visit: Payer: Self-pay

## 2020-06-12 ENCOUNTER — Telehealth: Payer: Self-pay

## 2020-06-12 NOTE — Telephone Encounter (Signed)
Left message for patient about rescheduling [facility] appt. Left name and number for patient to call back.

## 2020-06-15 ENCOUNTER — Encounter: Payer: Self-pay | Admitting: Internal Medicine

## 2020-06-16 ENCOUNTER — Ambulatory Visit: Payer: Self-pay

## 2020-06-16 ENCOUNTER — Telehealth: Payer: Self-pay | Admitting: Internal Medicine

## 2020-06-16 MED ORDER — PREDNISONE 10 MG PO TABS: ORAL_TABLET | ORAL | 0 refills | Status: DC

## 2020-06-16 NOTE — Telephone Encounter (Signed)
Pt states provider is aware she has gout yet has not had any medication sent to the pharmacy. Pt states waiting until one week 06/23/20 for appt is not necessary since we have the labs and notified her she has gout. Please call pt & advise. Please call in medication for gout. Sending high priority due to several calls.

## 2020-06-16 NOTE — Telephone Encounter (Signed)
Patient called to discuss her gout flare up. I advised I will ask a series of questions then schedule and appointment. She says she doesn't understand why she has to have an appointment when this is an ongoing problem for her and why she can't have medication prescribed for this. She says she went to the ED and was given 2 pills and some Hydrocodone, which the Hydrocodone doesn't touch the pain. She says she came in the last time, to have blood work done and it showed she has gout, nothing was prescribed for long term. She was not giving me the opportunity to ask the questions. I placed her on hold, called the office and spoke to [place], [facility] about the above concerns of the patient, advised the patient sent a message yesterday to Dr. Wynetta Emery with no response, she called this morning and the message was not responded to, so I called her after the 2nd call to triage her. I asked if she could speak to the patient to resolve this for her, she agreed. The call was connected successfully to [place], [place].   Summary: Gout Flare up   Patient experiencing swollen big toe gout flare up seeking clinical advice, message has been sent to patient PCP requesting Rx. Patient seeing clinical advice until provider follow up

## 2020-06-16 NOTE — Telephone Encounter (Signed)
Pt has sent dr Blima Dessert message yesterday. Pt is having a gout flare up and would like to know if dr Wynetta Emery would send in something to her [facility]

## 2020-06-17 MED FILL — ?PREDINSONE 10MG TABLETS: 10 | 9 days supply | Qty: 18 | Fill #0

## 2020-06-23 ENCOUNTER — Encounter: Payer: Self-pay | Admitting: Internal Medicine

## 2020-06-23 ENCOUNTER — Other Ambulatory Visit: Payer: Self-pay | Admitting: Internal Medicine

## 2020-06-23 ENCOUNTER — Ambulatory Visit: Payer: Self-pay | Attending: Internal Medicine | Admitting: Internal Medicine

## 2020-06-23 ENCOUNTER — Other Ambulatory Visit: Payer: Self-pay

## 2020-06-23 VITALS — BP 142/77 | HR 77 | Resp 16 | Wt 291.8 lb

## 2020-06-23 DIAGNOSIS — Z85820 Personal history of malignant melanoma of skin: Secondary | ICD-10-CM

## 2020-06-23 DIAGNOSIS — R7303 Prediabetes: Secondary | ICD-10-CM

## 2020-06-23 DIAGNOSIS — I10 Essential (primary) hypertension: Secondary | ICD-10-CM

## 2020-06-23 DIAGNOSIS — Z6841 Body Mass Index (BMI) 40.0 and over, adult: Secondary | ICD-10-CM

## 2020-06-23 DIAGNOSIS — E785 Hyperlipidemia, unspecified: Secondary | ICD-10-CM

## 2020-06-23 DIAGNOSIS — Z8739 Personal history of other diseases of the musculoskeletal system and connective tissue: Secondary | ICD-10-CM

## 2020-06-23 DIAGNOSIS — E66813 Obesity, class 3: Secondary | ICD-10-CM

## 2020-06-23 MED ORDER — HYDRALAZINE HCL 10 MG PO TABS: 10.0000 mg | ORAL_TABLET | Freq: Two times a day (BID) | ORAL | 5 refills | Status: DC

## 2020-06-23 MED ORDER — ALLOPURINOL 100 MG PO TABS: 100.0000 mg | ORAL_TABLET | Freq: Every day | ORAL | 6 refills | Status: DC

## 2020-06-23 MED ORDER — COLCHICINE 0.6 MG PO TABS: ORAL_TABLET | ORAL | 4 refills | Status: DC

## 2020-06-23 MED ORDER — COLCHICINE 0.6 MG PO TABS: 0.6000 mg | ORAL_TABLET | Freq: Every day | ORAL | 4 refills | Status: DC

## 2020-06-23 MED FILL — ?ATORVASTATIN 20 MG TABLET: 20 | 30 days supply | Qty: 30 | Fill #6

## 2020-06-23 MED FILL — hydrALAZINE HCL 10 MG TABS: 10 | 30 days supply | Qty: 60 | Fill #0

## 2020-06-23 MED FILL — ?ALLOPURINOL 100MG TABLET: 100 | 30 days supply | Qty: 30 | Fill #0

## 2020-06-23 MED FILL — LOSARTAN POTASSIUM 100 MG T: 100 | 30 days supply | Qty: 30 | Fill #1

## 2020-06-23 NOTE — Patient Instructions (Addendum)
Stop hydrochlorothiazide.  We have added a different blood pressure medication instead call hydralazine 10 mg twice a day.  Once you finish the prednisone, you should start colchicine and take 1 tablet daily.  Start allopurinol 100 mg daily.  This will help to decrease episodes of gout.  If you develop rash on allopurinol, you should stop the medication and come in.  Gout  Gout is painful swelling of your joints. Gout is a type of arthritis. It is caused by having too much uric acid in your body. Uric acid is a chemical that is made when your body breaks down substances called purines. If your body has too much uric acid, sharp crystals can form and build up in your joints. This causes pain and swelling. Gout attacks can happen quickly and be very painful (acute gout). Over time, the attacks can affect more joints and happen more often (chronic gout). What are the causes?  Too much uric acid in your blood. This can happen because: ? Your kidneys do not remove enough uric acid from your blood. ? Your body makes too much uric acid. ? You eat too many foods that are high in purines. These foods include organ meats, some seafood, and beer.  Trauma or stress. What increases the risk?  Having a family history of gout.  Being female and middle-aged.  Being female and having gone through menopause.  Being very overweight (obese).  Drinking alcohol, especially beer.  Not having enough water in the body (being dehydrated).  Losing weight too quickly.  Having an organ transplant.  Having lead poisoning.  Taking certain medicines.  Having kidney disease.  Having a skin condition called psoriasis. What are the signs or symptoms? An attack of acute gout usually happens in just one joint. The most common place is the big toe. Attacks often start at night. Other joints that may be affected include joints of the feet, ankle, knee, fingers, wrist, or elbow. Symptoms of an attack may  include:  Very bad pain.  Warmth.  Swelling.  Stiffness.  Shiny, red, or purple skin.  Tenderness. The affected joint may be very painful to touch.  Chills and fever. Chronic gout may cause symptoms more often. More joints may be involved. You may also have white or yellow lumps (tophi) on your hands or feet or in other areas near your joints. How is this treated?  Treatment for this condition has two phases: treating an acute attack and preventing future attacks.  Acute gout treatment may include: ? NSAIDs. ? Steroids. These are taken by mouth or injected into a joint. ? Colchicine. This medicine relieves pain and swelling. It can be given by mouth or through an IV tube.  Preventive treatment may include: ? Taking small doses of NSAIDs or colchicine daily. ? Using a medicine that reduces uric acid levels in your blood. ? Making changes to your diet. You may need to see a food expert (dietitian) about what to eat and drink to prevent gout. Follow these instructions at home: During a gout attack   If told, put ice on the painful area: ? Put ice in a plastic bag. ? Place a towel between your skin and the bag. ? Leave the ice on for 20 minutes, 2-3 times a day.  Raise (elevate) the painful joint above the level of your heart as often as you can.  Rest the joint as much as possible. If the joint is in your leg, you may be given  crutches.  Follow instructions from your doctor about what you cannot eat or drink. Avoiding future gout attacks  Eat a low-purine diet. Avoid foods and drinks such as: ? Liver. ? Kidney. ? Anchovies. ? Asparagus. ? Herring. ? Mushrooms. ? Mussels. ? Beer.  Stay at a healthy weight. If you want to lose weight, talk with your doctor. Do not lose weight too fast.  Start or continue an exercise plan as told by your doctor. Eating and drinking  Drink enough fluids to keep your pee (urine) pale yellow.  If you drink alcohol: ? Limit how  much you use to:  0-1 drink a day for women.  0-2 drinks a day for men. ? Be aware of how much alcohol is in your drink. In the [place] one drink equals one 12 oz bottle of beer (355 mL), one 5 oz glass of wine (148 mL), or one 1 oz glass of hard liquor (44 mL). General instructions  Take over-the-counter and prescription medicines only as told by your doctor.  Do not drive or use heavy machinery while taking prescription pain medicine.  Return to your normal activities as told by your doctor. Ask your doctor what activities are safe for you.  Keep all follow-up visits as told by your doctor. This is important. Contact a doctor if:  You have another gout attack.  You still have symptoms of a gout attack after 10 days of treatment.  You have problems (side effects) because of your medicines.  You have chills or a fever.  You have burning pain when you pee (urinate).  You have pain in your lower back or belly. Get help right away if:  You have very bad pain.  Your pain cannot be controlled.  You cannot pee. Summary  Gout is painful swelling of the joints.  The most common site of pain is the big toe, but it can affect other joints.  Medicines and avoiding some foods can help to prevent and treat gout attacks. This information is not intended to replace advice given to you by your health care provider. Make sure you discuss any questions you have with your health care provider. Document Revised: 06/14/2018 Document Reviewed: 06/14/2018 Elsevier Patient Education  [place].

## 2020-06-23 NOTE — Progress Notes (Signed)
Patient ID: Janet [name], female    DOB: May 27, 1971  MRN: [id number]  CC: Gout   Subjective: Janet [name] is a 49 y.o. female who presents for chronic ds management. Her concerns today include:  Pt with hx ofpre-DM,HTN, HL, GERD, obesity,OSA, on CPAP,vitB12 def, vit D def, obesity, anx/dep, psoriasis, melanoma, COVID-19 infection 03/2020.  Pt seen in ER last mth with pain in Rt big toe. Symptoms and exam c/w gout. Had another episode last wk in the same toe and had called for Prednisone.  Reports good results with prednisone.  She has 2 days left.    HTN:  She has taken Coreg this a.m.  Takes HCTZ and Losartan in the evenings.  No checking blood pressure at home over the past several weeks. She limits salt in the foods.  Obesity/PreDM: "I quit sodas." Reports portion sizes are adequate.  Walks around her apartment complex on wkends  HL: Taking and tolerating Lipitor  OSA:  Using CPAP machine  Hx of Melanoma: She never did get the appointment to see the dermatologist.  She would like the referral to be resubmitted. Patient Active Problem List   Diagnosis Date Noted  . COVID-19 virus infection 03/27/2020  . Glaucoma suspect of both eyes 09/17/2019  . Personal history of malignant melanoma of skin 09/17/2019  . Prediabetes 02/19/2019  . OSA (obstructive sleep apnea) 01/28/2019  . Seborrheic dermatitis of scalp 06/15/2018  . Psoriasis 02/14/2018  . Vitamin B 12 deficiency 02/14/2018  . Vitamin D deficiency 02/14/2018  . Anxiety and depression 02/03/2015  . Hyperlipemia 02/03/2015  . Obesity 02/03/2015  . Anemia due to chronic blood loss - reports history of evaluation and told due to menorrhagia 05/30/2014  . 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 Essential hypertension 08/22/2007  . GERD 08/22/2007  . ABFND PAP SMEAR HGSIL 08/22/2007     Current Outpatient Medications on File Prior to Visit  Medication Sig Dispense Refill  . albuterol (PROVENTIL) (2.5 MG/3ML) 0.083%  nebulizer solution Take 6 mLs (5 mg total) by nebulization every 4 (four) hours as needed for wheezing. 75 mL 0  . albuterol (VENTOLIN HFA) 108 (90 Base) MCG/ACT inhaler Inhale 2 puffs into the lungs every 4 (four) hours as needed for wheezing or shortness of breath. 18 g 0  . atorvastatin (LIPITOR) 20 MG tablet Take 1 tablet (20 mg total) by mouth daily. 90 tablet 3  . carvedilol (COREG) 3.125 MG tablet TAKE 1 TABLET (3.125 MG TOTAL) BY MOUTH 2 (TWO) TIMES DAILY WITH A MEAL. 60 tablet 0  . colchicine 0.6 MG tablet Take one tablet tonight. If no improvement in pain after one hour repeat dose. 2 tablet 0  . HYDROcodone-acetaminophen (NORCO/VICODIN) 5-325 MG tablet Take 1 tablet by mouth every 4 (four) hours as needed for severe pain. 8 tablet 0  . losartan (COZAAR) 100 MG tablet TAKE 1 TABLET (100 MG TOTAL) BY MOUTH DAILY. 30 tablet 6  . omeprazole (PRILOSEC) 20 MG capsule Take 1 capsule (20 mg total) by mouth daily as needed. 30 capsule 3  . PARoxetine (PAXIL) 10 MG tablet Take 1 tablet (10 mg total) by mouth daily. 30 tablet 5  . predniSONE (DELTASONE) 10 MG tablet 3 tabs p.o. daily x3 days then 2 tabs daily x3 days then 1 tab daily x3 days 18 tablet 0  . triamcinolone cream (KENALOG) 0.1 % Apply BID as needed to affected areas on extremities. 45 g 1  . vitamin B-12 (CYANOCOBALAMIN) 1000 MCG tablet Take 1  tablet (1,000 mcg total) by mouth daily. 100 tablet 1   No current facility-administered medications on file prior to visit.    Allergies  Allergen Reactions  . Lidocaine Hypertension    ? Lidocaine allergy? Reports heart racing when has "shot of medicine in cevix" for leep  . Lisinopril-Hydrochlorothiazide Other (See Comments)    Leg cramps.  . Metformin And Related Diarrhea  . Norvasc [Amlodipine Besylate] Other (See Comments)    Leg pain  . Penicillins Itching    Social History   Socioeconomic History  . Marital status: Single    Spouse name: Not on file  . Number of children:  Not on file  . Years of education: Not on file  . Highest education level: Not on file  Occupational History  . Not on file  Tobacco Use  . Smoking status: Never Smoker  . Smokeless tobacco: Never Used  Vaping Use  . Vaping Use: Never used  Substance and Sexual Activity  . Alcohol use: No  . Drug use: No  . Sexual activity: Never    Partners: Male    Birth control/protection: None  Other Topics Concern  . Not on file  Social History Narrative   Work or School: daycare in infant room      Home Situation: lives alone      Spiritual Beliefs: none      Lifestyle: no regular exercise; diet is poor            Social Determinants of Radio broadcast assistant Strain:   . Difficulty of Paying Living Expenses:   Food Insecurity:   . Worried About Charity fundraiser in the Last Year:   . Arboriculturist in the Last Year:   Transportation Needs:   . Film/video editor (Medical):   Marland Kitchen Lack of Transportation (Non-Medical):   Physical Activity:   . Days of Exercise per Week:   . Minutes of Exercise per Session:   Stress:   . Feeling of Stress :   Social Connections:   . Frequency of Communication with Friends and Family:   . Frequency of Social Gatherings with Friends and Family:   . Attends Religious Services:   . Active Member of Clubs or Organizations:   . Attends Archivist Meetings:   Marland Kitchen Marital Status:   Intimate Partner Violence:   . Fear of Current or Ex-Partner:   . Emotionally Abused:   Marland Kitchen Physically Abused:   . Sexually Abused:     Family History  Problem Relation Age of Onset  . Diabetes Mother   . Hypertension Mother   . Fibroids Mother   . Heart disease Father   . Hypertension Father   . Heart attack Father   . Cancer Father        liver cancer  . Diabetes Brother   . Hypertension Brother   . Diabetes Brother     Past Surgical History:  Procedure Laterality Date  . LEEP  2009   CIN I negative margin  . SKIN CANCER EXCISION   2010   chest area    ROS: Review of Systems Negative except as stated above  PHYSICAL EXAM: BP (!) 142/77   Pulse 77   Resp 16   Wt 291 lb 12.8 oz (132.4 kg)   SpO2 97%   BMI 47.10 kg/m   Wt Readings from Last 3 Encounters:  06/23/20 291 lb 12.8 oz (132.4 kg)  09/17/19 293 lb 6.4  oz (133.1 kg)  02/19/19 294 lb 12.8 oz (133.7 kg)  BP 130/82  Physical Exam  General appearance - alert, well appearing, and in no distress Mental status - normal mood, behavior, speech, dress, motor activity, and thought processes Neck - supple, no significant adenopathy Chest - clear to auscultation, no wheezes, rales or rhonchi, symmetric air entry Heart - normal rate, regular rhythm, normal S1, S2, no murmurs, rubs, clicks or gallops Extremities - peripheral pulses normal, no pedal edema, no clubbing or cyanosis MSK: Slight erythema on the ball of the right big toe.  CMP Latest Ref Rng & Units 03/25/2020 09/17/2019 09/08/2018  Glucose 70 - 99 mg/dL 104(H) 142(H) 150(H)  BUN 6 - 20 mg/dL 9 13 10   Creatinine 0.44 - 1.00 mg/dL 0.71 0.79 0.73  Sodium 135 - 145 mmol/L 138 140 142  Potassium 3.5 - 5.1 mmol/L 3.9 4.2 3.8  Chloride 98 - 111 mmol/L 101 101 103  CO2 22 - 32 mmol/L 24 24 21   Calcium 8.9 - 10.3 mg/dL 9.1 9.9 9.1  Total Protein 6.0 - 8.5 g/dL - 7.3 6.8  Total Bilirubin 0.0 - 1.2 mg/dL - <0.2 <0.2  Alkaline Phos 39 - 117 IU/L - 86 69  AST 0 - 40 IU/L - 24 23  ALT 0 - 32 IU/L - 27 24   Lipid Panel     Component Value Date/Time   CHOL 202 (H) 09/17/2019 1633   TRIG 130 09/17/2019 1633   HDL 58 09/17/2019 1633   CHOLHDL 3.5 09/17/2019 1633   CHOLHDL 4.0 12/14/2016 1421   VLDL 32 (H) 12/14/2016 1421   LDLCALC 121 (H) 09/17/2019 1633    CBC    Component Value Date/Time   WBC 6.1 03/25/2020 1609   RBC 4.09 03/25/2020 1609   HGB 12.6 03/25/2020 1609   HGB 13.6 09/17/2019 1633   HCT 39.6 03/25/2020 1609   HCT 41.4 09/17/2019 1633   PLT 319 03/25/2020 1609   PLT 407 09/17/2019  1633   MCV 96.8 03/25/2020 1609   MCV 95 09/17/2019 1633   MCH 30.8 03/25/2020 1609   MCHC 31.8 03/25/2020 1609   RDW 12.3 03/25/2020 1609   RDW 12.3 09/17/2019 1633   LYMPHSABS 3.0 09/29/2017 1544   MONOABS 0.6 10/11/2016 0745   EOSABS 0.2 09/29/2017 1544   BASOSABS 0.0 09/29/2017 1544    ASSESSMENT AND PLAN: 1. Essential hypertension Stop HCTZ as it can contribute to gout.  Change to hydralazine.  She has been intolerant of Norvasc in the past. - hydrALAZINE (APRESOLINE) 10 MG tablet; Take 1 tablet (10 mg total) by mouth 2 (two) times daily.  Dispense: 60 tablet; Refill: 5  2. Class 3 severe obesity due to excess calories without serious comorbidity with body mass index (BMI) of 45.0 to 49.9 in adult ([place]) 3. Prediabetes Healthy eating habits discussed and encouraged.  Encouraged her to try to walk at least 3 days a week for 30 minutes. - Hemoglobin A1c  4. History of gout Discussed diagnosis of gout and management.  Printed information given on foods that are high in purine so that she can try to avoid.  We have stop HCTZ.  We will put her on colchicine to take daily and overlap with allopurinol.  Went over possible side effects of severe rash with allopurinol.  If she develops any rash while taking this medication, patient told to stop the medicine and come in - colchicine 0.6 MG tablet; Take one tablet daily.  Dispense: 30  tablet; Refill: 4 - allopurinol (ZYLOPRIM) 100 MG tablet; Take 1 tablet (100 mg total) by mouth daily.  Dispense: 30 tablet; Refill: 6  5. Hyperlipidemia, unspecified hyperlipidemia type Continue atorvastatin - Lipid panel - Hepatic Function Panel  6. History of melanoma - Ambulatory referral to Dermatology   Patient was given the opportunity to ask questions.  Patient verbalized understanding of the plan and was able to repeat key elements of the plan.   No orders of the defined types were placed in this encounter.    Requested Prescriptions    No  prescriptions requested or ordered in this encounter    No follow-ups on file.  Karle Plumber, MD, FACP

## 2020-06-24 LAB — HEPATIC FUNCTION PANEL
ALT: 15 IU/L (ref 0–32)
AST: 12 IU/L (ref 0–40)
Albumin: 4.4 g/dL (ref 3.8–4.8)
Alkaline Phosphatase: 92 IU/L (ref 48–121)
Bilirubin Total: 0.3 mg/dL (ref 0.0–1.2)
Bilirubin, Direct: 0.1 mg/dL (ref 0.00–0.40)
Total Protein: 7 g/dL (ref 6.0–8.5)

## 2020-06-24 LAB — HEMOGLOBIN A1C
Est. average glucose Bld gHb Est-mCnc: 154 mg/dL
Hgb A1c MFr Bld: 7 % — ABNORMAL HIGH (ref 4.8–5.6)

## 2020-06-24 LAB — LIPID PANEL
Chol/HDL Ratio: 2.5 ratio (ref 0.0–4.4)
Cholesterol, Total: 167 mg/dL (ref 100–199)
HDL: 68 mg/dL (ref >39)
LDL Chol Calc (NIH): 81 mg/dL (ref 0–99)
Triglycerides: 100 mg/dL (ref 0–149)
VLDL Cholesterol Cal: 18 mg/dL (ref 5–40)

## 2020-06-24 MED ORDER — TRUE METRIX BLOOD GLUCOSE TEST VI STRP: ORAL_STRIP | 12 refills | Status: DC

## 2020-06-24 MED ORDER — TRUE METRIX METER W/DEVICE KIT: PACK | 0 refills | Status: DC

## 2020-06-24 MED ORDER — TRUEPLUS LANCETS 28G MISC: 4 refills | Status: DC

## 2020-06-24 MED FILL — ?COLCHINCINE 0.6 MG TABS: 0.6 | 30 days supply | Qty: 30 | Fill #0

## 2020-06-25 ENCOUNTER — Other Ambulatory Visit: Payer: Self-pay

## 2020-06-25 MED ORDER — TRUE METRIX BLOOD GLUCOSE TEST VI STRP
ORAL_STRIP | 12 refills | Status: DC
  Filled 2021-03-09: qty 100, 50d supply, fill #0
  Filled 2021-06-21: qty 100, 50d supply, fill #1

## 2020-06-25 MED ORDER — TRUE METRIX METER W/DEVICE KIT: PACK | 0 refills | Status: DC

## 2020-06-25 MED ORDER — TRUEPLUS LANCETS 28G MISC: 4 refills | Status: DC

## 2020-06-25 MED FILL — TRUE METRIX TEST STRIP: 30 days supply | Qty: 100 | Fill #0

## 2020-06-25 MED FILL — !TRUE METRIX BLOOD GLUCOSE: 30 days supply | Qty: 1 | Fill #0

## 2020-06-25 MED FILL — TRUEplus LANCETS 28G MISC: 30 days supply | Qty: 100 | Fill #0

## 2020-06-27 ENCOUNTER — Telehealth: Payer: Self-pay | Admitting: Internal Medicine

## 2020-06-27 ENCOUNTER — Other Ambulatory Visit: Payer: Self-pay | Admitting: Internal Medicine

## 2020-06-27 MED ORDER — GLIMEPIRIDE 2 MG PO TABS: 2.0000 mg | ORAL_TABLET | Freq: Every day | ORAL | 3 refills | Status: DC

## 2020-06-27 NOTE — Telephone Encounter (Signed)
According to the message concerning her lab results,  pt was instructed to start a low dose of  Glimepiride However, the Rx was not sent in. Can you send to [place] [place] [employer] Phone:  [phone]  Fax:  [phone]

## 2020-06-27 NOTE — Telephone Encounter (Signed)
Will forward to pcp

## 2020-06-30 MED FILL — GLIMEPIRIDE 2 MG TABS: 2 | 30 days supply | Qty: 30 | Fill #0

## 2020-07-05 ENCOUNTER — Other Ambulatory Visit: Payer: Self-pay | Admitting: Internal Medicine

## 2020-07-05 DIAGNOSIS — I10 Essential (primary) hypertension: Secondary | ICD-10-CM

## 2020-07-22 ENCOUNTER — Ambulatory Visit: Payer: Self-pay

## 2020-07-22 MED FILL — ?ATORVASTATIN 20 MG TABLET: 20 | 30 days supply | Qty: 30 | Fill #7

## 2020-07-22 MED FILL — hydrALAZINE HCL 10 MG TABS: 10 | 30 days supply | Qty: 60 | Fill #1

## 2020-07-22 MED FILL — LOSARTAN POTASSIUM 100 MG T: 100 | 30 days supply | Qty: 30 | Fill #2

## 2020-07-22 MED FILL — ?COLCHINCINE 0.6 MG TABS: 0.6 | 30 days supply | Qty: 30 | Fill #1

## 2020-07-22 MED FILL — ?ALLOPURINOL 100MG TABLET: 100 | 30 days supply | Qty: 30 | Fill #1

## 2020-07-24 ENCOUNTER — Ambulatory Visit
Admission: RE | Admit: 2020-07-24 | Discharge: 2020-07-24 | Disposition: A | Discharge status: Home / Self Care | Payer: No Typology Code available for payment source | Source: Ambulatory Visit | Attending: Obstetrics and Gynecology | Admitting: Obstetrics and Gynecology

## 2020-07-24 ENCOUNTER — Other Ambulatory Visit: Payer: Self-pay

## 2020-07-24 ENCOUNTER — Ambulatory Visit: Payer: Self-pay | Admitting: *Deleted

## 2020-07-24 VITALS — BP 122/86 | Temp 97.7°F | Wt 300.0 lb

## 2020-07-24 DIAGNOSIS — Z1239 Encounter for other screening for malignant neoplasm of breast: Secondary | ICD-10-CM

## 2020-07-24 DIAGNOSIS — Z1231 Encounter for screening mammogram for malignant neoplasm of breast: Secondary | ICD-10-CM

## 2020-07-24 NOTE — Patient Instructions (Addendum)
Informed Janet [name] about breast self awareness. Patient did not need a Pap smear today due to last Pap smear was on 10/18/2017. Let her know [facility] will cover Pap smears every 3 years unless has a history of abnormal Pap smears. Referred patient to the [facility] for screening mammogram on the mobile unit. Appointment scheduled for July 24, 2020 at 4:00pm. Patient escorted to mobile unit for mammogram following [facility] appointment. Let patient know the [facility] will follow up with her within the next couple weeks with results of her mammogram by letter or phone. Janet [name] verbalized understanding.  Vania Rea, RN, FNP student 3:46 PM

## 2020-07-24 NOTE — Progress Notes (Signed)
Janet [name] is a 49 y.o. female who presents to [facility] clinic today with no complaints.    Pap Smear: Pap smear not completed today. Last Pap smear was 10/18/2017 at [facility] clinic and was normal. Per patient has history of an abnormal Pap smear in 2008 that was followed up with a colposcopy and LEEP. Patient states that she has had at least 3 normal Pap smears since then. Last Pap smear result is available in Epic.   Physical exam: Breasts Breasts symmetrical. No skin abnormalities bilateral breasts. No nipple retraction bilateral breasts. No nipple discharge bilateral breasts. No lymphadenopathy. No lumps palpated bilateral breasts. No complaints of pain or tenderness on exam.      Pelvic/Bimanual Pap is not indicated today.    Smoking History: Patient has never smoked.    Patient Navigation: Patient education provided. Access to services provided for patient through [facility] program.    Breast and Cervical Cancer Risk Assessment: Patient does not have family history of breast cancer, known genetic mutations, or radiation treatment to the chest before age 71. Patient does have history of cervical dysplasia. Patient has no history of being immunocompromised or DES exposure in-utero.  Risk Assessment    Risk Scores      07/24/2020 05/11/2018   Last edited by: Royston Bake, CMA Rolena Infante H, LPN   5-year risk: 0.8 % 0.7 %   Lifetime risk: 8.4 % 8.6 %          A: [facility] exam without pap smear No complaints today.  P: Referred patient to the [facility] for a screening mammogram on the mobile unit. Appointment scheduled July 24, 2020 at 4:00pm.  Vania Rea, RN, FNP student 07/24/2020 3:40 PM   Attestation of Supervision of Student:  I confirm that I have verified the information documented in the nurse practitioner student's note and that I have also personally reperformed the history, physical exam and all medical decision making  activities.  I have verified that all services and findings are accurately documented in this student's note; and I agree with management and plan as outlined in the documentation. I have also made any necessary editorial changes.  [place], [place] for [employer], [place] 07/24/2020 4:06 PM

## 2020-07-25 MED FILL — GLIMEPIRIDE 2 MG TABS: 2 | 30 days supply | Qty: 30 | Fill #1

## 2020-07-29 ENCOUNTER — Encounter: Payer: Self-pay | Admitting: Internal Medicine

## 2020-07-29 DIAGNOSIS — I10 Essential (primary) hypertension: Secondary | ICD-10-CM

## 2020-08-27 ENCOUNTER — Other Ambulatory Visit: Payer: Self-pay | Admitting: Internal Medicine

## 2020-08-27 DIAGNOSIS — I10 Essential (primary) hypertension: Secondary | ICD-10-CM

## 2020-08-27 MED FILL — CARVEDILOL 3.125 MG TABLET: 3.125 | 30 days supply | Qty: 60 | Fill #0

## 2020-08-27 MED FILL — ATORVASTATIN CALCIUM 20 MG: 20 | 30 days supply | Qty: 30 | Fill #8

## 2020-08-27 MED FILL — hydrALAZINE HCL 10 MG TABS: 10 | 30 days supply | Qty: 60 | Fill #2

## 2020-08-27 MED FILL — ?ALLOPURINOL 100MG TABLET: 100 | 30 days supply | Qty: 30 | Fill #2

## 2020-08-27 MED FILL — GLIMEPIRIDE 2 MG TABS: 2 | 30 days supply | Qty: 30 | Fill #2

## 2020-08-27 MED FILL — ?COLCHINCINE 0.6 MG TABS: 0.6 | 30 days supply | Qty: 30 | Fill #2

## 2020-08-27 MED FILL — LOSARTAN POTASSIUM 100 MG T: 100 | 30 days supply | Qty: 30 | Fill #3

## 2020-09-15 ENCOUNTER — Encounter: Payer: Self-pay | Admitting: Internal Medicine

## 2020-09-16 ENCOUNTER — Other Ambulatory Visit: Payer: Self-pay | Admitting: Internal Medicine

## 2020-09-16 MED ORDER — FUROSEMIDE 20 MG PO TABS: ORAL_TABLET | ORAL | 3 refills | Status: DC

## 2020-09-16 MED FILL — FUROSEMIDE 20 MG TABS: 20 | 30 days supply | Qty: 30 | Fill #0

## 2020-09-25 ENCOUNTER — Ambulatory Visit: Payer: No Typology Code available for payment source | Admitting: Physician Assistant

## 2020-09-29 ENCOUNTER — Other Ambulatory Visit: Payer: Self-pay | Admitting: Internal Medicine

## 2020-09-29 MED FILL — ?CARVEDILOL 3.125 MG TABLET: 3.125 | 30 days supply | Qty: 60 | Fill #1

## 2020-09-29 MED FILL — LOSARTAN POTASSIUM 100 MG T: 100 | 30 days supply | Qty: 30 | Fill #4

## 2020-09-29 MED FILL — TRUEplus LANCETS 28G MISC: 30 days supply | Qty: 100 | Fill #1

## 2020-09-29 MED FILL — TRUE METRIX TEST STRIP: 30 days supply | Qty: 100 | Fill #1

## 2020-09-29 MED FILL — GLIMEPIRIDE 2 MG TABS: 2 | 30 days supply | Qty: 30 | Fill #3

## 2020-09-29 MED FILL — ?COLCHINCINE 0.6 MG TABS: 0.6 | 30 days supply | Qty: 30 | Fill #3

## 2020-09-29 MED FILL — ?ATORVASTATIN 20 MG TABLET: 20 | 30 days supply | Qty: 30 | Fill #0

## 2020-09-29 MED FILL — ?ALLOPURINOL 100MG TABLET: 100 | 30 days supply | Qty: 30 | Fill #3

## 2020-09-29 NOTE — Telephone Encounter (Signed)
Requested Prescriptions  Pending Prescriptions Disp Refills   atorvastatin (LIPITOR) 20 MG tablet [Pharmacy Med Name: ATORVASTATIN CALCIUM 20 MG 20 Tablet] 90 tablet 3    Sig: TAKE 1 TABLET (20 MG TOTAL) BY MOUTH DAILY.     Cardiovascular:  Antilipid - Statins Failed - 09/29/2020 10:05 AM      Failed - LDL in normal range and within 360 days    LDL Chol Calc (NIH)  Date Value Ref Range Status  06/23/2020 81 0 - 99 mg/dL Final         Passed - Total Cholesterol in normal range and within 360 days    Cholesterol, Total  Date Value Ref Range Status  06/23/2020 167 100 - 199 mg/dL Final         Passed - HDL in normal range and within 360 days    HDL  Date Value Ref Range Status  06/23/2020 68 >39 mg/dL Final         Passed - Triglycerides in normal range and within 360 days    Triglycerides  Date Value Ref Range Status  06/23/2020 100 0 - 149 mg/dL Final         Passed - Patient is not pregnant      Passed - Valid encounter within last 12 months    Recent Outpatient Visits          3 months ago Essential hypertension   [place], Neoma Laming B, MD   4 months ago Acute gout involving toe of right foot, unspecified cause   [place], [place], NP   6 months ago COVID-19 virus infection   [place] Elsie Stain, MD   6 months ago COVID-19 virus infection   [place], Patrick E, MD   8 months ago Essential hypertension   [place], [place], NP      Future Appointments            In 1 week Camillia Herter, NP [place]

## 2020-10-01 MED FILL — hydrALAZINE HCL 10 MG TABS: 10 | 30 days supply | Qty: 60 | Fill #3

## 2020-10-06 ENCOUNTER — Other Ambulatory Visit: Payer: Self-pay | Admitting: Internal Medicine

## 2020-10-06 DIAGNOSIS — Z8739 Personal history of other diseases of the musculoskeletal system and connective tissue: Secondary | ICD-10-CM

## 2020-10-06 MED FILL — CARVEDILOL 3.125 MG TABLET: 3.125 | 30 days supply | Qty: 60 | Fill #1

## 2020-10-06 MED FILL — GLIMEPIRIDE 2 MG TABS: 2 | 30 days supply | Qty: 30 | Fill #3

## 2020-10-06 MED FILL — ?ALLOPURINOL 100MG TABLET: 100 | 30 days supply | Qty: 30 | Fill #3

## 2020-10-06 MED FILL — ?COLCHINCINE 0.6 MG TABS: 0.6 | 30 days supply | Qty: 30 | Fill #3

## 2020-10-06 MED FILL — ?ATORVASTATIN 20 MG TABLET: 20 | 30 days supply | Qty: 30 | Fill #0

## 2020-10-06 MED FILL — FUROSEMIDE 20 MG TABS: 20 | 30 days supply | Qty: 30 | Fill #1

## 2020-10-06 MED FILL — LOSARTAN POTASSIUM 100 MG T: 100 | 30 days supply | Qty: 30 | Fill #4

## 2020-10-06 MED FILL — TRUE METRIX TEST STRIP: 25 days supply | Qty: 100 | Fill #0

## 2020-10-06 MED FILL — TRUEplus LANCETS 28G MISC: 25 days supply | Qty: 100 | Fill #0

## 2020-10-09 ENCOUNTER — Encounter: Payer: Self-pay | Admitting: Family

## 2020-10-09 ENCOUNTER — Other Ambulatory Visit: Payer: Self-pay

## 2020-10-09 ENCOUNTER — Ambulatory Visit: Payer: Self-pay | Attending: Internal Medicine | Admitting: Family

## 2020-10-09 VITALS — BP 128/83 | HR 99 | Temp 98.1°F | Ht 67.0 in | Wt 298.0 lb

## 2020-10-09 DIAGNOSIS — Z23 Encounter for immunization: Secondary | ICD-10-CM

## 2020-10-09 DIAGNOSIS — R5383 Other fatigue: Secondary | ICD-10-CM

## 2020-10-09 NOTE — Progress Notes (Signed)
States that she has been tired.  Wants to get VitD checked.

## 2020-10-09 NOTE — Patient Instructions (Signed)
Lab today. Flu vaccine today. Fatigue If you have fatigue, you feel tired all the time and have a lack of energy or a lack of motivation. Fatigue may make it difficult to start or complete tasks because of exhaustion. In general, occasional or mild fatigue is often a normal response to activity or life. However, long-lasting (chronic) or extreme fatigue may be a symptom of a medical condition. Follow these instructions at home: General instructions  Watch your fatigue for any changes.  Go to bed and get up at the same time every day.  Avoid fatigue by pacing yourself during the day and getting enough sleep at night.  Maintain a healthy weight. Medicines  Take over-the-counter and prescription medicines only as told by your health care provider.  Take a multivitamin, if told by your health care provider.  Do not use herbal or dietary supplements unless they are approved by your health care provider. Activity   Exercise regularly, as told by your health care provider.  Use or practice techniques to help you relax, such as yoga, tai chi, meditation, or massage therapy. Eating and drinking   Avoid heavy meals in the evening.  Eat a well-balanced diet, which includes lean proteins, whole grains, plenty of fruits and vegetables, and low-fat dairy products.  Avoid consuming too much caffeine.  Avoid the use of alcohol.  Drink enough fluid to keep your urine pale yellow. Lifestyle  Change situations that cause you stress. Try to keep your work and personal schedule in balance.  Do not use any products that contain nicotine or tobacco, such as cigarettes and e-cigarettes. If you need help quitting, ask your health care provider.  Do not use drugs. Contact a health care provider if:  Your fatigue does not get better.  You have a fever.  You suddenly lose or gain weight.  You have headaches.  You have trouble falling asleep or sleeping through the night.  You feel  angry, guilty, anxious, or sad.  You are unable to have a bowel movement (constipation).  Your skin is dry.  You have swelling in your legs or another part of your body. Get help right away if:  You feel confused.  Your vision is blurry.  You feel faint or you pass out.  You have a severe headache.  You have severe pain in your abdomen, your back, or the area between your waist and hips (pelvis).  You have chest pain, shortness of breath, or an irregular or fast heartbeat.  You are unable to urinate, or you urinate less than normal.  You have abnormal bleeding, such as bleeding from the rectum, vagina, nose, lungs, or nipples.  You vomit blood.  You have thoughts about hurting yourself or others. If you ever feel like you may hurt yourself or others, or have thoughts about taking your own life, get help right away. You can go to your nearest emergency department or call:  Your local emergency services (911 in the [place]  A suicide crisis helpline, such as the [place] at 419-351-7686. This is open 24 hours a day. Summary  If you have fatigue, you feel tired all the time and have a lack of energy or a lack of motivation.  Fatigue may make it difficult to start or complete tasks because of exhaustion.  Long-lasting (chronic) or extreme fatigue may be a symptom of a medical condition.  Exercise regularly, as told by your health care provider.  Change situations that cause you  stress. Try to keep your work and personal schedule in balance. This information is not intended to replace advice given to you by your health care provider. Make sure you discuss any questions you have with your health care provider. Document Revised: 06/13/2019 Document Reviewed: 08/17/2017 Elsevier Patient Education  2020 [employer].

## 2020-10-09 NOTE — Progress Notes (Signed)
Patient ID: Janet [name], female    DOB: [birth year]  MRN: [id number]  CC: Tiredness  Subjective: Janet [name] is a 49 y.o. female with history of essential hypertension, obstructive sleep apnea, GERD, anxiety and depression, hyperlipidemia, vitamin B 12 deficiency, vitamin D deficiency, and prediabetes who presents for tiredness.  1. TIREDNESS: Duration:  chronic, years, does have vitamin D deficiency and taking over-the-counter medication. Severity: worsening Onset: gradual Symptoms improve with rest: no  Depressive symptoms: job related, denies thoughts of self-harm and suicidal ideation Stress/anxiety: yes, job related, denies thoughts of self-harm and suicidal ideation Insomnia: no  Snoring: yes Daytime hypersomnolence:yes  Wakes feeling refreshed: no Orthopnea/PND: no Chest pain: no  Lower extremity edema: bilateral ankles do swell sometimes and taking Lasix which helps  Patient Active Problem List   Diagnosis Date Noted  . COVID-19 virus infection 03/27/2020  . Glaucoma suspect of both eyes 09/17/2019  . Personal history of malignant melanoma of skin 09/17/2019  . Prediabetes 02/19/2019  . OSA (obstructive sleep apnea) 01/28/2019  . Seborrheic dermatitis of scalp 06/15/2018  . Psoriasis 02/14/2018  . Vitamin B 12 deficiency 02/14/2018  . Vitamin D deficiency 02/14/2018  . Anxiety and depression 02/03/2015  . Hyperlipemia 02/03/2015  . Obesity 02/03/2015  . Anemia due to chronic blood loss - reports history of evaluation and told due to menorrhagia 05/30/2014  . 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 Essential hypertension 08/22/2007  . GERD 08/22/2007  . ABFND PAP SMEAR HGSIL 08/22/2007     Current Outpatient Medications on File Prior to Visit  Medication Sig Dispense Refill  . allopurinol (ZYLOPRIM) 100 MG tablet Take 1 tablet (100 mg total) by mouth daily. 30 tablet 6  . atorvastatin (LIPITOR) 20 MG tablet TAKE 1 TABLET (20 MG TOTAL) BY MOUTH DAILY. 90 tablet  3  . Blood Glucose Monitoring Suppl (TRUE METRIX METER) w/Device KIT Use as directed 1 kit 0  . carvedilol (COREG) 3.125 MG tablet TAKE 1 TABLET (3.125 MG TOTAL) BY MOUTH 2 (TWO) TIMES DAILY WITH A MEAL. 60 tablet 3  . cholecalciferol (VITAMIN D3) 25 MCG (1000 UNIT) tablet Take 1,000 Units by mouth daily.    . colchicine 0.6 MG tablet Take 1 tablet (0.6 mg total) by mouth daily. 30 tablet 4  . furosemide (LASIX) 20 MG tablet 1 tab po daily PRN swelling in legs 30 tablet 3  . glimepiride (AMARYL) 2 MG tablet Take 1 tablet (2 mg total) by mouth daily before breakfast. 30 tablet 3  . glucose blood (TRUE METRIX BLOOD GLUCOSE TEST) test strip Use as instructed 100 each 12  . hydrALAZINE (APRESOLINE) 10 MG tablet Take 1 tablet (10 mg total) by mouth 2 (two) times daily. 60 tablet 5  . losartan (COZAAR) 100 MG tablet TAKE 1 TABLET (100 MG TOTAL) BY MOUTH DAILY. 30 tablet 6  . albuterol (PROVENTIL) (2.5 MG/3ML) 0.083% nebulizer solution Take 6 mLs (5 mg total) by nebulization every 4 (four) hours as needed for wheezing. (Patient not taking: Reported on 07/24/2020) 75 mL 0  . albuterol (VENTOLIN HFA) 108 (90 Base) MCG/ACT inhaler Inhale 2 puffs into the lungs every 4 (four) hours as needed for wheezing or shortness of breath. (Patient not taking: Reported on 07/24/2020) 18 g 0  . omeprazole (PRILOSEC) 20 MG capsule Take 1 capsule (20 mg total) by mouth daily as needed. (Patient not taking: Reported on 07/24/2020) 30 capsule 3  . PARoxetine (PAXIL) 10 MG tablet Take 1 tablet (10 mg total) by mouth  daily. (Patient not taking: Reported on 07/24/2020) 30 tablet 5  . predniSONE (DELTASONE) 10 MG tablet 3 tabs p.o. daily x3 days then 2 tabs daily x3 days then 1 tab daily x3 days (Patient not taking: Reported on 07/24/2020) 18 tablet 0  . triamcinolone cream (KENALOG) 0.1 % Apply BID as needed to affected areas on extremities. (Patient not taking: Reported on 07/24/2020) 45 g 1  . TRUEplus Lancets 28G MISC Use as directed  (Patient not taking: Reported on 07/24/2020) 100 each 4  . vitamin B-12 (CYANOCOBALAMIN) 1000 MCG tablet Take 1 tablet (1,000 mcg total) by mouth daily. (Patient not taking: Reported on 07/24/2020) 100 tablet 1   No current facility-administered medications on file prior to visit.    Allergies  Allergen Reactions  . Lidocaine Hypertension    ? Lidocaine allergy? Reports heart racing when has "shot of medicine in cevix" for leep  . Hydralazine Other (See Comments)    Edema in legs  . Lisinopril-Hydrochlorothiazide Other (See Comments)    Leg cramps.  . Metformin And Related Diarrhea  . Norvasc [Amlodipine Besylate] Other (See Comments)    Leg pain  . Penicillins Itching    Social History   Socioeconomic History  . Marital status: Single    Spouse name: Not on file  . Number of children: Not on file  . Years of education: Not on file  . Highest education level: Not on file  Occupational History  . Not on file  Tobacco Use  . Smoking status: Never Smoker  . Smokeless tobacco: Never Used  Vaping Use  . Vaping Use: Never used  Substance and Sexual Activity  . Alcohol use: No  . Drug use: No  . Sexual activity: Never    Partners: Male    Birth control/protection: None  Other Topics Concern  . Not on file  Social History Narrative   Work or School: daycare in infant room      Home Situation: lives alone      Spiritual Beliefs: none      Lifestyle: no regular exercise; diet is poor            Social Determinants of Radio broadcast assistant Strain:   . Difficulty of Paying Living Expenses: Not on file  Food Insecurity:   . Worried About Charity fundraiser in the Last Year: Not on file  . Ran Out of Food in the Last Year: Not on file  Transportation Needs:   . Lack of Transportation (Medical): Not on file  . Lack of Transportation (Non-Medical): Not on file  Physical Activity:   . Days of Exercise per Week: Not on file  . Minutes of Exercise per Session: Not  on file  Stress:   . Feeling of Stress : Not on file  Social Connections:   . Frequency of Communication with Friends and Family: Not on file  . Frequency of Social Gatherings with Friends and Family: Not on file  . Attends Religious Services: Not on file  . Active Member of Clubs or Organizations: Not on file  . Attends Archivist Meetings: Not on file  . Marital Status: Not on file  Intimate Partner Violence:   . Fear of Current or Ex-Partner: Not on file  . Emotionally Abused: Not on file  . Physically Abused: Not on file  . Sexually Abused: Not on file    Family History  Problem Relation Age of Onset  . Diabetes Mother   .  Hypertension Mother   . Fibroids Mother   . Heart disease Father   . Hypertension Father   . Heart attack Father   . Cancer Father        liver cancer  . Diabetes Brother   . Hypertension Brother   . Diabetes Brother     Past Surgical History:  Procedure Laterality Date  . LEEP  2009   CIN I negative margin  . SKIN CANCER EXCISION  2010   chest area    ROS: Review of Systems Negative except as stated above  PHYSICAL EXAM: BP 128/83   Pulse 99   Temp 98.1 F (36.7 C) (Oral)   Ht '5\' 7"'  (1.702 m)   Wt 298 lb (135.2 kg)   SpO2 99%   BMI 46.67 kg/m   Physical Exam General appearance - alert, well appearing, and in no distress, oriented to person, place, and time and overweight Mental status - alert, oriented to person, place, and time, normal mood, behavior, speech, dress, motor activity, and thought processes Eyes - pupils equal and reactive, extraocular eye movements intact Ears - bilateral TM's and external ear canals normal Neck - supple, no significant adenopathy, bilateral symmetric anterior adenopathy Lymphatics - no palpable lymphadenopathy, no hepatosplenomegaly Chest - clear to auscultation, no wheezes, rales or rhonchi, symmetric air entry, no tachypnea, retractions or cyanosis Heart - normal rate, regular rhythm,  normal S1, S2, no murmurs, rubs, clicks or gallops Neurological - alert, oriented, normal speech, no focal findings or movement disorder noted, cranial nerves II through XII intact, normal muscle tone, no tremors, strength 5/5 Extremities - bilateral ankle edema 1+  ASSESSMENT AND PLAN: 1. Fatigue, unspecified type: - Chronic - Vitamin D, 25-hydroxy to test for vitamin D deficiency. Does have history of vitamin D deficiency and currently taking over-the-counter medication. - CBC to check for anemia. - TSH to check for possible thyroid dysfunction.  - CMP to check kidney function, liver function, and electrolyte balance. - Hemoglobin A1C to check level of diabetes control. Does have history of pre-diabetes and taking Amaryl. - Vitamin D, 25-hydroxy - CBC - TSH - CMP14+EGFR - Hemoglobin A1c  2. Need for immunization against influenza: - Flu vaccine administered today in clinic. - Flu Vaccine QUAD 36+ mos IM  Patient was given the opportunity to ask questions.  Patient verbalized understanding of the plan and was able to repeat key elements of the plan. Patient was given clear instructions to go to Emergency Department or return to medical center if symptoms don't improve, worsen, or new problems develop.The patient verbalized understanding.   Orders Placed This Encounter  Procedures  . Flu Vaccine QUAD 36+ mos IM  . Vitamin D, 25-hydroxy  . CBC  . TSH  . CMP14+EGFR  . Hemoglobin A1c    Requested Prescriptions    No prescriptions requested or ordered in this encounter    Follow-up with primary physician as needed.  Camillia Herter, NP

## 2020-10-10 LAB — CMP14+EGFR
ALT: 31 IU/L (ref 0–32)
AST: 30 IU/L (ref 0–40)
Albumin/Globulin Ratio: 1.8 (ref 1.2–2.2)
Albumin: 4.4 g/dL (ref 3.8–4.8)
Alkaline Phosphatase: 72 IU/L (ref 44–121)
BUN/Creatinine Ratio: 19 (ref 9–23)
BUN: 13 mg/dL (ref 6–24)
Bilirubin Total: <0.2 mg/dL (ref 0.0–1.2)
CO2: 25 mmol/L (ref 20–29)
Calcium: 9.6 mg/dL (ref 8.7–10.2)
Chloride: 103 mmol/L (ref 96–106)
Creatinine, Ser: 0.67 mg/dL (ref 0.57–1.00)
GFR calc Af Amer: 121 mL/min/{1.73_m2} (ref >59)
GFR calc non Af Amer: 105 mL/min/{1.73_m2} (ref >59)
Globulin, Total: 2.5 g/dL (ref 1.5–4.5)
Glucose: 100 mg/dL — ABNORMAL HIGH (ref 65–99)
Potassium: 3.9 mmol/L (ref 3.5–5.2)
Sodium: 141 mmol/L (ref 134–144)
Total Protein: 6.9 g/dL (ref 6.0–8.5)

## 2020-10-10 LAB — HEMOGLOBIN A1C
Est. average glucose Bld gHb Est-mCnc: 128 mg/dL
Hgb A1c MFr Bld: 6.1 % — ABNORMAL HIGH (ref 4.8–5.6)

## 2020-10-10 LAB — CBC
Hematocrit: 38.6 % (ref 34.0–46.6)
Hemoglobin: 12.6 g/dL (ref 11.1–15.9)
MCH: 29 pg (ref 26.6–33.0)
MCHC: 32.6 g/dL (ref 31.5–35.7)
MCV: 89 fL (ref 79–97)
Platelets: 298 10*3/uL (ref 150–450)
RBC: 4.35 x10E6/uL (ref 3.77–5.28)
RDW: 15.4 % (ref 11.7–15.4)
WBC: 7.2 10*3/uL (ref 3.4–10.8)

## 2020-10-10 LAB — TSH: TSH: 1.93 u[IU]/mL (ref 0.450–4.500)

## 2020-10-10 LAB — VITAMIN D 25 HYDROXY (VIT D DEFICIENCY, FRACTURES): Vit D, 25-Hydroxy: 24.5 ng/mL — ABNORMAL LOW (ref 30.0–100.0)

## 2020-10-10 NOTE — Progress Notes (Signed)
Please call patient with update.   Vitamin D improved since last lab in 2019. Continue over-the-counter vitamin D as prescribed. Not indicated to begin vitamin D injections at this time.  No anemia.   Thyroid normal.   Kidney function normal.   Liver function normal.    Diabetes 6.1% which is improved from 7.0% three months ago. Continue medication(s) as prescribed. Keep all appointments with primary physician.

## 2020-10-13 ENCOUNTER — Telehealth: Payer: Self-pay

## 2020-10-13 NOTE — Telephone Encounter (Signed)
Patient name and DOB has been verified Patient was informed of lab results. Patient had no questions.  

## 2020-10-13 NOTE — Telephone Encounter (Signed)
-----   Message from Camillia Herter, NP sent at 10/10/2020  9:25 PM EDT ----- Please call patient with update.   Vitamin D improved since last lab in 2019. Continue over-the-counter vitamin D as prescribed. Not indicated to begin vitamin D injections at this time.  No anemia.   Thyroid normal.   Kidney function normal.   Liver function normal.    Diabetes 6.1% which is improved from 7.0% three months ago. Continue medication(s) as prescribed. Keep all appointments with primary physician.

## 2020-11-10 ENCOUNTER — Ambulatory Visit: Payer: No Typology Code available for payment source

## 2020-11-13 ENCOUNTER — Other Ambulatory Visit: Payer: Self-pay | Admitting: Internal Medicine

## 2020-11-13 MED FILL — ?COLCHINCINE 0.6 MG TABS: 0.6 | 30 days supply | Qty: 30 | Fill #4

## 2020-11-13 MED FILL — ?ATORVASTATIN 20 MG TABLET: 20 | 30 days supply | Qty: 30 | Fill #1

## 2020-11-13 MED FILL — GLIMEPIRIDE 2 MG TABS: 2 | 30 days supply | Qty: 30 | Fill #0

## 2020-11-13 MED FILL — ?ALLOPURINOL 100MG TABLET: 100 | 30 days supply | Qty: 30 | Fill #4

## 2020-11-13 MED FILL — LOSARTAN POTASSIUM 100 MG T: 100 | 30 days supply | Qty: 30 | Fill #5

## 2020-12-04 MED FILL — CARVEDILOL 3.125 MG TABLET: 3.125 | 30 days supply | Qty: 60 | Fill #2

## 2020-12-19 MED FILL — LOSARTAN POTASSIUM 100 MG T: 100 | 30 days supply | Qty: 30 | Fill #6

## 2020-12-19 MED FILL — ?ALLOPURINOL 100MG TABLET: 100 | 30 days supply | Qty: 30 | Fill #5

## 2020-12-19 MED FILL — ?ATORVASTATIN 20 MG TABLET: 20 | 30 days supply | Qty: 30 | Fill #2

## 2020-12-19 MED FILL — GLIMEPIRIDE 2 MG TABS: 2 | 30 days supply | Qty: 30 | Fill #1

## 2020-12-19 MED FILL — hydrALAZINE HCL 10 MG TABS: 10 | 30 days supply | Qty: 60 | Fill #4

## 2020-12-19 MED FILL — COLCHICINE 0.6 MG TABS: 0.6 | 30 days supply | Qty: 30 | Fill #0

## 2021-01-28 ENCOUNTER — Other Ambulatory Visit: Payer: Self-pay | Admitting: Internal Medicine

## 2021-01-28 DIAGNOSIS — I10 Essential (primary) hypertension: Secondary | ICD-10-CM

## 2021-01-28 MED FILL — GLIMEPIRIDE 2 MG TABS: 2 | 30 days supply | Qty: 30 | Fill #2

## 2021-01-28 MED FILL — ?ATORVASTATIN 20 MG TABLET: 20 | 30 days supply | Qty: 30 | Fill #3

## 2021-01-28 MED FILL — ?ALLOPURINOL 100MG TABLET: 100 | 30 days supply | Qty: 30 | Fill #6

## 2021-01-28 MED FILL — LOSARTAN POTASSIUM 100 MG T: 100 | 30 days supply | Qty: 30 | Fill #0

## 2021-01-28 MED FILL — COLCHICINE 0.6 MG TABS: 0.6 | 30 days supply | Qty: 30 | Fill #1

## 2021-01-28 MED FILL — hydrALAZINE HCL 10 MG TABS: 10 | 30 days supply | Qty: 60 | Fill #5

## 2021-02-02 ENCOUNTER — Ambulatory Visit: Payer: No Typology Code available for payment source

## 2021-03-02 ENCOUNTER — Other Ambulatory Visit: Payer: Self-pay | Admitting: Internal Medicine

## 2021-03-02 DIAGNOSIS — Z8739 Personal history of other diseases of the musculoskeletal system and connective tissue: Secondary | ICD-10-CM

## 2021-03-02 MED FILL — ALLOPURINOL 100 MG TABLET: 100 | 30 days supply | Qty: 30 | Fill #0

## 2021-03-02 MED FILL — COLCHICINE 0.6 MG TABS: 0.6 | 30 days supply | Qty: 30 | Fill #2

## 2021-03-02 MED FILL — ?CARVEDILOL 3.125 MG TABLET: 3.125 | 30 days supply | Qty: 60 | Fill #3

## 2021-03-02 MED FILL — GLIMEPIRIDE 2 MG TABS: 2 | 30 days supply | Qty: 30 | Fill #3

## 2021-03-02 MED FILL — LOSARTAN POTASSIUM 100 MG T: 100 | 30 days supply | Qty: 30 | Fill #1

## 2021-03-08 ENCOUNTER — Other Ambulatory Visit: Payer: Self-pay

## 2021-03-09 ENCOUNTER — Other Ambulatory Visit: Payer: Self-pay

## 2021-03-09 MED FILL — Lancets: 50 days supply | Qty: 100 | Fill #0 | Status: AC

## 2021-03-09 MED FILL — Atorvastatin Calcium Tab 20 MG (Base Equivalent): ORAL | 30 days supply | Qty: 30 | Fill #0 | Status: AC

## 2021-03-10 ENCOUNTER — Other Ambulatory Visit: Payer: Self-pay

## 2021-03-12 ENCOUNTER — Ambulatory Visit: Payer: No Typology Code available for payment source | Admitting: Physician Assistant

## 2021-04-09 ENCOUNTER — Other Ambulatory Visit: Payer: Self-pay | Admitting: Internal Medicine

## 2021-04-09 MED FILL — Atorvastatin Calcium Tab 20 MG (Base Equivalent): ORAL | 30 days supply | Qty: 30 | Fill #1 | Status: AC

## 2021-04-09 MED FILL — Colchicine Tab 0.6 MG: ORAL | 30 days supply | Qty: 30 | Fill #0 | Status: AC

## 2021-04-09 MED FILL — Allopurinol Tab 100 MG: ORAL | 30 days supply | Qty: 30 | Fill #0 | Status: AC

## 2021-04-09 MED FILL — Losartan Potassium Tab 100 MG: ORAL | 30 days supply | Qty: 30 | Fill #0 | Status: AC

## 2021-04-10 ENCOUNTER — Other Ambulatory Visit: Payer: Self-pay

## 2021-04-10 MED ORDER — GLIMEPIRIDE 2 MG PO TABS
2.0000 mg | ORAL_TABLET | Freq: Every day | ORAL | 0 refills | Status: DC
  Filled 2021-04-10: qty 30, 30d supply, fill #0

## 2021-04-13 ENCOUNTER — Other Ambulatory Visit: Payer: Self-pay

## 2021-04-14 ENCOUNTER — Ambulatory Visit: Payer: No Typology Code available for payment source

## 2021-04-15 ENCOUNTER — Other Ambulatory Visit: Payer: Self-pay

## 2021-04-16 ENCOUNTER — Telehealth: Payer: Self-pay | Admitting: Internal Medicine

## 2021-04-16 NOTE — Telephone Encounter (Signed)
Copied from [place] [id number]. Topic: General - Other >> Apr 14, 2021  3:25 PM Janet [name] wrote: Reason for CRM: Patient would like to be contacted by [name] member of pharmacy staff when possible   Patient is attempting to pay for and coordinate the delivery of their prescriptions  Please contact to further advise when possible   Called patient and LVM advising patient that I was calling from [facility] in regards to [name] pharmacy request she received. Advised patient that she needed to reach out to pharmacy for this issue. Advised patient to call pharmacy at [phone]. Advised her to call [phone] if she had any further questions or concerns for [place].

## 2021-04-20 ENCOUNTER — Telehealth: Payer: Self-pay | Admitting: Internal Medicine

## 2021-04-20 NOTE — Telephone Encounter (Signed)
Call placed to [facility].  Spoke to Yetter who said to disregard the request, the order has been voided and nothing else is needed.

## 2021-04-20 NOTE — Telephone Encounter (Signed)
Mary calling from [facility] supply is calling to see if orders where received for CPAP supplies. Will re fax the order. CB- [phone]

## 2021-04-21 ENCOUNTER — Encounter: Payer: Self-pay | Admitting: Internal Medicine

## 2021-05-14 ENCOUNTER — Other Ambulatory Visit: Payer: Self-pay

## 2021-05-14 ENCOUNTER — Ambulatory Visit: Payer: Self-pay | Attending: Physician Assistant | Admitting: Internal Medicine

## 2021-05-14 ENCOUNTER — Other Ambulatory Visit: Payer: Self-pay | Admitting: Internal Medicine

## 2021-05-14 ENCOUNTER — Encounter: Payer: Self-pay | Admitting: Internal Medicine

## 2021-05-14 VITALS — BP 134/82 | HR 82 | Resp 16 | Wt 298.4 lb

## 2021-05-14 DIAGNOSIS — Z9989 Dependence on other enabling machines and devices: Secondary | ICD-10-CM

## 2021-05-14 DIAGNOSIS — G4733 Obstructive sleep apnea (adult) (pediatric): Secondary | ICD-10-CM

## 2021-05-14 DIAGNOSIS — G629 Polyneuropathy, unspecified: Secondary | ICD-10-CM

## 2021-05-14 DIAGNOSIS — I10 Essential (primary) hypertension: Secondary | ICD-10-CM

## 2021-05-14 DIAGNOSIS — E538 Deficiency of other specified B group vitamins: Secondary | ICD-10-CM

## 2021-05-14 DIAGNOSIS — Z8739 Personal history of other diseases of the musculoskeletal system and connective tissue: Secondary | ICD-10-CM

## 2021-05-14 DIAGNOSIS — R7303 Prediabetes: Secondary | ICD-10-CM

## 2021-05-14 DIAGNOSIS — Z1211 Encounter for screening for malignant neoplasm of colon: Secondary | ICD-10-CM

## 2021-05-14 LAB — POCT GLYCOSYLATED HEMOGLOBIN (HGB A1C): HbA1c, POC (prediabetic range): 5.9 % (ref 5.7–6.4)

## 2021-05-14 LAB — GLUCOSE, POCT (MANUAL RESULT ENTRY): POC Glucose: 151 mg/dl — AB (ref 70–99)

## 2021-05-14 MED ORDER — HYDRALAZINE HCL 10 MG PO TABS
ORAL_TABLET | Freq: Two times a day (BID) | ORAL | 3 refills | Status: DC
  Filled 2021-05-14: qty 60, 30d supply, fill #0
  Filled 2021-06-21: qty 60, 30d supply, fill #1
  Filled 2021-07-27: qty 60, 30d supply, fill #2
  Filled 2021-08-28: qty 60, 30d supply, fill #3

## 2021-05-14 MED ORDER — GLIMEPIRIDE 2 MG PO TABS
2.0000 mg | ORAL_TABLET | Freq: Every day | ORAL | 6 refills | Status: DC
  Filled 2021-05-14: qty 30, 30d supply, fill #0
  Filled 2021-06-21: qty 30, 30d supply, fill #1
  Filled 2021-07-27: qty 30, 30d supply, fill #2
  Filled 2021-08-28: qty 30, 30d supply, fill #3
  Filled 2021-10-06: qty 30, 30d supply, fill #4
  Filled 2021-11-13: qty 30, 30d supply, fill #5
  Filled 2021-12-14: qty 30, 30d supply, fill #6
  Filled 2021-12-15 – 2021-12-23 (×2): qty 30, 30d supply, fill #0

## 2021-05-14 MED ORDER — ALLOPURINOL 100 MG PO TABS
ORAL_TABLET | Freq: Every day | ORAL | 3 refills | Status: DC
  Filled 2021-05-14: qty 30, 30d supply, fill #0
  Filled 2021-06-21: qty 30, 30d supply, fill #1
  Filled 2021-07-27: qty 30, 30d supply, fill #2
  Filled 2021-08-28: qty 30, 30d supply, fill #3

## 2021-05-14 MED ORDER — GABAPENTIN 300 MG PO CAPS
300.0000 mg | ORAL_CAPSULE | Freq: Every day | ORAL | 3 refills | Status: DC
  Filled 2021-05-14: qty 30, 30d supply, fill #0
  Filled 2021-06-21: qty 30, 30d supply, fill #1
  Filled 2021-07-27: qty 30, 30d supply, fill #2
  Filled 2021-08-28: qty 30, 30d supply, fill #3

## 2021-05-14 MED ORDER — CARVEDILOL 3.125 MG PO TABS
ORAL_TABLET | Freq: Two times a day (BID) | ORAL | 3 refills | Status: DC
  Filled 2021-05-14: qty 60, 30d supply, fill #0
  Filled 2021-06-21: qty 60, 30d supply, fill #1
  Filled 2021-07-27: qty 60, 30d supply, fill #2
  Filled 2021-08-28: qty 60, 30d supply, fill #3

## 2021-05-14 MED FILL — Losartan Potassium Tab 100 MG: ORAL | 30 days supply | Qty: 30 | Fill #1 | Status: AC

## 2021-05-14 NOTE — Progress Notes (Signed)
Patient ID: Janet [name], female    DOB: 1971-10-26  MRN: [id number]  CC: Diabetes and Hypertension   Subjective: Janet [name] is a 50 y.o. female who presents for chronic ds management Her concerns today include:  Pt with hx of pre-DM,HTN, HL, GERD, obesity, OSA, on CPAP, vit B12 def, vit D def, obesity, anx/dep, gout, psoriasis, melanoma, COVID-19 infection 03/2020.  Prediabetes/obesity: Joining the gym about 2 weeks ago.  She has started going.  Plans to go 3 days a week.  Some improvement in eating habits.  Drinking more water and trying to avoid eating bunt cake which she likes.  She has cut out eating red meat.  A1c today 5.9.  She is taking the glimepiride.  Complains of some numbness on the ball of both feet and plantar surface of both big toes x1 month.  She wears tennis shoes at work with good support.  HTN: Reports compliance with taking carvedilol, hydralazine and furosemide.  Limit salt in the foods.  No device to check blood pressure.  OSA on CPAP: Uses the CPAP at least 4 days a week.  When asked why she is not using it every night she states that she is going through some adjustments as she recently had to move in with her mother.  Reports daytime tiredness and can fall asleep easily.  She wonders whether her vitamin B12 level is still low and whether she needs vitamin B12 shots.  She takes vitamin B12 1000 mcg daily.  HM: She will be having her Pap done through [facility] on 06/03/2021.  Due for colon cancer screening.  Family history of polyps in her father.  Patient Active Problem List   Diagnosis Date Noted   COVID-19 virus infection 03/27/2020   Glaucoma suspect of both eyes 09/17/2019   Personal history of malignant melanoma of skin 09/17/2019   Prediabetes 02/19/2019   OSA (obstructive sleep apnea) 01/28/2019   Seborrheic dermatitis of scalp 06/15/2018   Psoriasis 02/14/2018   Vitamin B 12 deficiency 02/14/2018   Vitamin D deficiency 02/14/2018   Anxiety and  depression 02/03/2015   Hyperlipemia 02/03/2015   Obesity 02/03/2015   Anemia due to chronic blood loss - reports history of evaluation and told due to menorrhagia 05/30/2014   MELANOMA, TRUNK 01/24/2008   Essential hypertension 08/22/2007   GERD 08/22/2007   ABFND PAP SMEAR HGSIL 08/22/2007     Current Outpatient Medications on File Prior to Visit  Medication Sig Dispense Refill   albuterol (PROVENTIL) (2.5 MG/3ML) 0.083% nebulizer solution Take 6 mLs (5 mg total) by nebulization every 4 (four) hours as needed for wheezing. (Patient not taking: Reported on 07/24/2020) 75 mL 0   albuterol (VENTOLIN HFA) 108 (90 Base) MCG/ACT inhaler Inhale 2 puffs into the lungs every 4 (four) hours as needed for wheezing or shortness of breath. (Patient not taking: Reported on 07/24/2020) 18 g 0   allopurinol (ZYLOPRIM) 100 MG tablet TAKE 1 TABLET (100 MG TOTAL) BY MOUTH DAILY. 30 tablet 1   atorvastatin (LIPITOR) 20 MG tablet TAKE 1 TABLET (20 MG TOTAL) BY MOUTH DAILY. 90 tablet 3   Blood Glucose Monitoring Suppl (TRUE METRIX METER) w/Device KIT Use as directed 1 kit 0   carvedilol (COREG) 3.125 MG tablet TAKE 1 TABLET (3.125 MG TOTAL) BY MOUTH 2 (TWO) TIMES DAILY WITH A MEAL. 60 tablet 3   cholecalciferol (VITAMIN D3) 25 MCG (1000 UNIT) tablet Take 1,000 Units by mouth daily.     colchicine 0.6 MG  tablet TAKE ONE TABLET TONIGHT. IF NO IMPROVEMENT IN PAIN AFTER ONE HOUR REPEAT DOSE. 30 tablet 4   furosemide (LASIX) 20 MG tablet TAKE 1 TABLET BY MOUTH AS NEEDED FOR SWELLING IN LEGS 30 tablet 3   glimepiride (AMARYL) 2 MG tablet Take 1 tablet (2 mg total) by mouth daily before breakfast. 30 tablet 0   glucose blood (TRUE METRIX BLOOD GLUCOSE TEST) test strip Use as instructed 100 each 12   hydrALAZINE (APRESOLINE) 10 MG tablet TAKE 1 TABLET (10 MG TOTAL) BY MOUTH 2 (TWO) TIMES DAILY. 60 tablet 5   losartan (COZAAR) 100 MG tablet TAKE 1 TABLET (100 MG TOTAL) BY MOUTH DAILY. 90 tablet 1   omeprazole (PRILOSEC)  20 MG capsule Take 1 capsule (20 mg total) by mouth daily as needed. (Patient not taking: Reported on 07/24/2020) 30 capsule 3   PARoxetine (PAXIL) 10 MG tablet Take 1 tablet (10 mg total) by mouth daily. (Patient not taking: Reported on 07/24/2020) 30 tablet 5   predniSONE (DELTASONE) 10 MG tablet 3 tabs p.o. daily x3 days then 2 tabs daily x3 days then 1 tab daily x3 days (Patient not taking: Reported on 07/24/2020) 18 tablet 0   triamcinolone cream (KENALOG) 0.1 % Apply BID as needed to affected areas on extremities. (Patient not taking: Reported on 07/24/2020) 45 g 1   TRUEplus Lancets 28G MISC USE AS DIRECTED (Patient not taking: Reported on 07/24/2020) 100 each 4   vitamin B-12 (CYANOCOBALAMIN) 1000 MCG tablet Take 1 tablet (1,000 mcg total) by mouth daily. (Patient not taking: Reported on 07/24/2020) 100 tablet 1   No current facility-administered medications on file prior to visit.    Allergies  Allergen Reactions   Lidocaine Hypertension    ? Lidocaine allergy? Reports heart racing when has "shot of medicine in cevix" for leep   Hydralazine Other (See Comments)    Edema in legs   Lisinopril-Hydrochlorothiazide Other (See Comments)    Leg cramps.   Metformin And Related Diarrhea   Norvasc [Amlodipine Besylate] Other (See Comments)    Leg pain   Penicillins Itching    Social History   Socioeconomic History   Marital status: Single    Spouse name: Not on file   Number of children: Not on file   Years of education: Not on file   Highest education level: Not on file  Occupational History   Not on file  Tobacco Use   Smoking status: Never   Smokeless tobacco: Never  Vaping Use   Vaping Use: Never used  Substance and Sexual Activity   Alcohol use: No   Drug use: No   Sexual activity: Never    Partners: Male    Birth control/protection: None  Other Topics Concern   Not on file  Social History Narrative   Work or School: daycare in infant room      Home Situation: lives  alone      Spiritual Beliefs: none      Lifestyle: no regular exercise; diet is poor            Social Determinants of Radio broadcast assistant Strain: Not on file  Food Insecurity: Not on file  Transportation Needs: Not on file  Physical Activity: Not on file  Stress: Not on file  Social Connections: Not on file  Intimate Partner Violence: Not on file    Family History  Problem Relation Age of Onset   Diabetes Mother    Hypertension Mother  Fibroids Mother    Heart disease Father    Hypertension Father    Heart attack Father    Cancer Father        liver cancer   Diabetes Brother    Hypertension Brother    Diabetes Brother     Past Surgical History:  Procedure Laterality Date   LEEP  2009   CIN I negative margin   SKIN CANCER EXCISION  2010   chest area    ROS: Review of Systems Negative except as stated above  PHYSICAL EXAM: BP 134/82   Pulse 82   Resp 16   Wt 298 lb 6.4 oz (135.4 kg)   SpO2 98%   BMI 46.74 kg/m   Wt Readings from Last 3 Encounters:  05/14/21 298 lb 6.4 oz (135.4 kg)  10/09/20 298 lb (135.2 kg)  07/24/20 300 lb (136.1 kg)    Physical Exam  General appearance - alert, well appearing, and in no distress Mental status - normal mood, behavior, speech, dress, motor activity, and thought processes Chest - clear to auscultation, no wheezes, rales or rhonchi, symmetric air entry Heart - normal rate, regular rhythm, normal S1, S2, no murmurs, rubs, clicks or gallops Extremities - peripheral pulses normal, no pedal edema, no clubbing or cyanosis Leap exam: Decrease sensation on the ball of both feet.  Good dorsalis pedis pulses.  CMP Latest Ref Rng & Units 10/09/2020 06/23/2020 03/25/2020  Glucose 65 - 99 mg/dL 100(H) - 104(H)  BUN 6 - 24 mg/dL 13 - 9  Creatinine 0.57 - 1.00 mg/dL 0.67 - 0.71  Sodium 134 - 144 mmol/L 141 - 138  Potassium 3.5 - 5.2 mmol/L 3.9 - 3.9  Chloride 96 - 106 mmol/L 103 - 101  CO2 20 - 29 mmol/L 25 - 24   Calcium 8.7 - 10.2 mg/dL 9.6 - 9.1  Total Protein 6.0 - 8.5 g/dL 6.9 7.0 -  Total Bilirubin 0.0 - 1.2 mg/dL <0.2 0.3 -  Alkaline Phos 44 - 121 IU/L 72 92 -  AST 0 - 40 IU/L 30 12 -  ALT 0 - 32 IU/L 31 15 -   Lipid Panel     Component Value Date/Time   CHOL 167 06/23/2020 1548   TRIG 100 06/23/2020 1548   HDL 68 06/23/2020 1548   CHOLHDL 2.5 06/23/2020 1548   CHOLHDL 4.0 12/14/2016 1421   VLDL 32 (H) 12/14/2016 1421   LDLCALC 81 06/23/2020 1548    CBC    Component Value Date/Time   WBC 7.2 10/09/2020 1657   WBC 6.1 03/25/2020 1609   RBC 4.35 10/09/2020 1657   RBC 4.09 03/25/2020 1609   HGB 12.6 10/09/2020 1657   HCT 38.6 10/09/2020 1657   PLT 298 10/09/2020 1657   MCV 89 10/09/2020 1657   MCH 29.0 10/09/2020 1657   MCH 30.8 03/25/2020 1609   MCHC 32.6 10/09/2020 1657   MCHC 31.8 03/25/2020 1609   RDW 15.4 10/09/2020 1657   LYMPHSABS 3.0 09/29/2017 1544   MONOABS 0.6 10/11/2016 0745   EOSABS 0.2 09/29/2017 1544   BASOSABS 0.0 09/29/2017 1544   Results for orders placed or performed in visit on 05/14/21  POCT glucose (manual entry)  Result Value Ref Range   POC Glucose 151 (A) 70 - 99 mg/dl  POCT glycosylated hemoglobin (Hb A1C)  Result Value Ref Range   Hemoglobin A1C     HbA1c POC (<> result, manual entry)     HbA1c, POC (prediabetic range) 5.9 5.7 -  6.4 %   HbA1c, POC (controlled diabetic range)      ASSESSMENT AND PLAN: 1. Prediabetes Commended her on the small changes that she has made in her diet so far.  Further dietary counseling given. Commended her on joining the gym.  Encouraged her to try to be consistent and going 3 days a week and getting in 30 to 45 minutes of moderate intensity exercise. - POCT glucose (manual entry) - POCT glycosylated hemoglobin (Hb A1C) - glimepiride (AMARYL) 2 MG tablet; Take 1 tablet (2 mg total) by mouth daily before breakfast.  Dispense: 30 tablet; Refill: 6  2. Essential hypertension Close to goal.  Continue current  medications as listed above.  3. OSA on CPAP Encourage daily use of her CPAP machine.  4. Morbid obesity ([place]) See #1 above.  5. Vitamin B12 deficiency Continue vitamin B-12 supplement orally. - Vitamin B12  6. Peripheral polyneuropathy Could be due to B12 deficiency.  We will check her B12 level today.  Other possibility is neuropathy associated with prediabetes/DM - gabapentin (NEURONTIN) 300 MG capsule; Take 1 capsule (300 mg total) by mouth at bedtime.  Dispense: 30 capsule; Refill: 3  7. Screening for colon cancer Given history of polyps in her father, I recommend colonoscopy rather than other methods of screening. - Ambulatory referral to Gastroenterology     Patient was given the opportunity to ask questions.  Patient verbalized understanding of the plan and was able to repeat key elements of the plan.   Orders Placed This Encounter  Procedures   POCT glucose (manual entry)   POCT glycosylated hemoglobin (Hb A1C)     Requested Prescriptions    No prescriptions requested or ordered in this encounter    No follow-ups on file.  Karle Plumber, MD, FACP

## 2021-05-14 NOTE — Progress Notes (Signed)
Pt states she wants to discuss neuroptahy in b/l feet

## 2021-05-14 NOTE — Patient Instructions (Signed)
We will try you with a medication called gabapentin to help decrease the neuropathy symptoms.   Healthy Eating Following a healthy eating pattern may help you to achieve and maintain a healthy body weight, reduce the risk of chronic disease, and live a long and productive life. It is important to follow a healthy eating pattern at an appropriate calorie level for your body. Your nutritional needs should be met primarily through food by choosing a variety of nutrient-rich foods. What are tips for following this plan? Reading food labels Read labels and choose the following: Reduced or low sodium. Juices with 100% fruit juice. Foods with low saturated fats and high polyunsaturated and monounsaturated fats. Foods with whole grains, such as whole wheat, cracked wheat, brown rice, and wild rice. Whole grains that are fortified with folic acid. This is recommended for women who are pregnant or who want to become pregnant. Read labels and avoid the following: Foods with a lot of added sugars. These include foods that contain brown sugar, corn sweetener, corn syrup, dextrose, fructose, glucose, high-fructose corn syrup, honey, invert sugar, lactose, malt syrup, maltose, molasses, raw sugar, sucrose, trehalose, or turbinado sugar. Do not eat more than the following amounts of added sugar per day: 6 teaspoons (25 g) for women. 9 teaspoons (38 g) for men. Foods that contain processed or refined starches and grains. Refined grain products, such as white flour, degermed cornmeal, white bread, and white rice. Shopping Choose nutrient-rich snacks, such as vegetables, whole fruits, and nuts. Avoid high-calorie and high-sugar snacks, such as potato chips, fruit snacks, and candy. Use oil-based dressings and spreads on foods instead of solid fats such as butter, stick margarine, or cream cheese. Limit pre-made sauces, mixes, and "instant" products such as flavored rice, instant noodles, and ready-made  pasta. Try more plant-protein sources, such as tofu, tempeh, black beans, edamame, lentils, nuts, and seeds. Explore eating plans such as the Mediterranean diet or vegetarian diet. Cooking Use oil to saut or stir-fry foods instead of solid fats such as butter, stick margarine, or lard. Try baking, boiling, grilling, or broiling instead of frying. Remove the fatty part of meats before cooking. Steam vegetables in water or broth. Meal planning At meals, imagine dividing your plate into fourths: One-half of your plate is fruits and vegetables. One-fourth of your plate is whole grains. One-fourth of your plate is protein, especially lean meats, poultry, eggs, tofu, beans, or nuts. Include low-fat dairy as part of your daily diet.   Lifestyle Choose healthy options in all settings, including home, work, school, restaurants, or stores. Prepare your food safely: Wash your hands after handling raw meats. Keep food preparation surfaces clean by regularly washing with hot, soapy water. Keep raw meats separate from ready-to-eat foods, such as fruits and vegetables. Cook seafood, meat, poultry, and eggs to the recommended internal temperature. Store foods at safe temperatures. In general: Keep cold foods at 56F (4.4C) or below. Keep hot foods at 156F (60C) or above. Keep your freezer at [facility] (-17.8C) or below. Foods are no longer safe to eat when they have been between the temperatures of 40-156F (4.4-60C) for more than 2 hours. What foods should I eat? Fruits Aim to eat 2 cup-equivalents of fresh, canned (in natural juice), or frozen fruits each day. Examples of 1 cup-equivalent of fruit include 1 small apple, 8 large strawberries, 1 cup canned fruit,  cup dried fruit, or 1 cup 100% juice. Vegetables Aim to eat 2-3 cup-equivalents of fresh and frozen vegetables each day,  including different varieties and colors. Examples of 1 cup-equivalent of vegetables include 2 medium carrots, 2 cups  raw, leafy greens, 1 cup chopped vegetable (raw or cooked), or 1 medium baked potato. Grains Aim to eat 6 ounce-equivalents of whole grains each day. Examples of 1 ounce-equivalent of grains include 1 slice of bread, 1 cup ready-to-eat cereal, 3 cups popcorn, or  cup cooked rice, pasta, or cereal. Meats and other proteins Aim to eat 5-6 ounce-equivalents of protein each day. Examples of 1 ounce-equivalent of protein include 1 egg, 1/2 cup nuts or seeds, or 1 tablespoon (16 g) peanut butter. A cut of meat or fish that is the size of a deck of cards is about 3-4 ounce-equivalents. Of the protein you eat each week, try to have at least 8 ounces come from seafood. This includes salmon, trout, herring, and anchovies. Dairy Aim to eat 3 cup-equivalents of fat-free or low-fat dairy each day. Examples of 1 cup-equivalent of dairy include 1 cup (240 mL) milk, 8 ounces (250 g) yogurt, 1 ounces (44 g) natural cheese, or 1 cup (240 mL) fortified soy milk. Fats and oils Aim for about 5 teaspoons (21 g) per day. Choose monounsaturated fats, such as canola and olive oils, avocados, peanut butter, and most nuts, or polyunsaturated fats, such as sunflower, corn, and soybean oils, walnuts, pine nuts, sesame seeds, sunflower seeds, and flaxseed. Beverages Aim for six 8-oz glasses of water per day. Limit coffee to three to five 8-oz cups per day. Limit caffeinated beverages that have added calories, such as soda and energy drinks. Limit alcohol intake to no more than 1 drink a day for nonpregnant women and 2 drinks a day for men. One drink equals 12 oz of beer (355 mL), 5 oz of wine (148 mL), or 1 oz of hard liquor (44 mL). Seasoning and other foods Avoid adding excess amounts of salt to your foods. Try flavoring foods with herbs and spices instead of salt. Avoid adding sugar to foods. Try using oil-based dressings, sauces, and spreads instead of solid fats. This information is based on general [place] nutrition  guidelines. For more information, visit [employer]. Exact amounts may vary based on your nutrition needs. Summary A healthy eating plan may help you to maintain a healthy weight, reduce the risk of chronic diseases, and stay active throughout your life. Plan your meals. Make sure you eat the right portions of a variety of nutrient-rich foods. Try baking, boiling, grilling, or broiling instead of frying. Choose healthy options in all settings, including home, work, school, restaurants, or stores. This information is not intended to replace advice given to you by your health care provider. Make sure you discuss any questions you have with your health care provider. Document Revised: 03/06/2018 Document Reviewed: 03/06/2018 Elsevier Patient Education  [place].

## 2021-05-15 ENCOUNTER — Other Ambulatory Visit: Payer: Self-pay

## 2021-05-15 LAB — VITAMIN B12: Vitamin B-12: 295 pg/mL (ref 232–1245)

## 2021-05-20 ENCOUNTER — Encounter: Payer: Self-pay | Admitting: Gastroenterology

## 2021-05-23 MED FILL — Colchicine Tab 0.6 MG: ORAL | 30 days supply | Qty: 30 | Fill #1 | Status: AC

## 2021-05-24 ENCOUNTER — Encounter: Payer: Self-pay | Admitting: Internal Medicine

## 2021-05-24 ENCOUNTER — Other Ambulatory Visit: Payer: Self-pay | Admitting: Internal Medicine

## 2021-05-24 DIAGNOSIS — E538 Deficiency of other specified B group vitamins: Secondary | ICD-10-CM

## 2021-05-24 MED ORDER — CYANOCOBALAMIN 1000 MCG/ML IJ SOLN
1000.0000 ug | INTRAMUSCULAR | Status: AC
  Administered 2021-07-27 – 2021-09-28 (×3): 1000 ug via INTRAMUSCULAR

## 2021-05-25 ENCOUNTER — Other Ambulatory Visit: Payer: Self-pay

## 2021-05-26 ENCOUNTER — Other Ambulatory Visit: Payer: Self-pay

## 2021-05-26 ENCOUNTER — Ambulatory Visit: Payer: Self-pay | Attending: Internal Medicine | Admitting: *Deleted

## 2021-05-26 ENCOUNTER — Telehealth: Payer: Self-pay | Admitting: Internal Medicine

## 2021-05-26 DIAGNOSIS — E538 Deficiency of other specified B group vitamins: Secondary | ICD-10-CM

## 2021-05-26 MED ORDER — CYANOCOBALAMIN 1000 MCG/ML IJ SOLN
1000.0000 ug | Freq: Once | INTRAMUSCULAR | Status: AC
  Administered 2021-05-26: 1000 ug via INTRAMUSCULAR

## 2021-05-26 NOTE — Progress Notes (Signed)
Patient here today for B12 injection. Vitamin B12 given today left deltoid.  Site unremarkable & patient tolerated injection.  Next injection due in month.

## 2021-05-26 NOTE — Telephone Encounter (Signed)
Called patient no answer and no voicemail set up. Per Dr. Wynetta Emery patient need to be scheduled for a nurse visit to get her B12 injection. If patient return call please get her scheduled for a nurse visit or transfer the call to the office.

## 2021-06-03 ENCOUNTER — Other Ambulatory Visit: Payer: Self-pay

## 2021-06-03 ENCOUNTER — Ambulatory Visit: Payer: Self-pay | Attending: Internal Medicine

## 2021-06-03 ENCOUNTER — Other Ambulatory Visit: Payer: Self-pay | Admitting: *Deleted

## 2021-06-03 DIAGNOSIS — Z124 Encounter for screening for malignant neoplasm of cervix: Secondary | ICD-10-CM

## 2021-06-03 NOTE — Addendum Note (Signed)
Addended by: Demetrius Revel on: 06/03/2021 11:40 AM   Modules accepted: Orders

## 2021-06-03 NOTE — Progress Notes (Signed)
Patient: Janet [name]           Date of Birth: 04-22-1971           MRN: [id number] Visit Date: 06/03/2021 PCP: Ladell Pier, MD  Cervical Cancer Screening Do you smoke?: No Have you ever had or been told you have an allergy to latex products?: No Marital status: Single Date of last pap smear: 2-5 yrs ago (10/18/2017- NEGATIVE ([facility])) Date of last menstrual period: 05/12/21 (Spotting on 05/12/2021 x 2 days and 05/20/2021-05/21/2021 x 2 days, very light) Number of pregnancies: 1 Number of births: 1 Have you ever had any of the following? Hysterectomy: No Tubal ligation (tubes tied): No Abnormal bleeding: No Abnormal pap smear: Yes (LEEP 2008, HSIL, CIN-II) Venereal warts: No A sex partner with venereal warts: No A high risk* sex partner: No  Cervical Exam  Abnormal Observations: Normal Exam. Recommendations: Last Pap smear was 10/18/2017 in [facility] and normal with negative HPV. Patients prior Pap smear was 07/29/2014 and normal with negative HPV. 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 Patient has had four normal Pap smears completed since LEEP. Last four Pap smears, Colposcopy, and LEEP results are in EPIC. If today's Pap smear is normal next Pap smear will be due in 3 years due to patients history. Let patient know will call her with results of Pap smear within the next couple of weeks. Reminded patient she is due for her screening mammogram in August 2022 and that she will need to renew her [facility]CP card. Told patient to call [facility] to schedule appointment. Patient verbalized understanding.    Patient's History Patient Active Problem List   Diagnosis Date Noted  . Peripheral polyneuropathy 05/14/2021  . COVID-19 virus infection 03/27/2020  . Glaucoma suspect of both eyes 09/17/2019  . Personal history of malignant melanoma of skin 09/17/2019   . Prediabetes 02/19/2019  . OSA (obstructive sleep apnea) 01/28/2019  . Seborrheic dermatitis of scalp 06/15/2018  . Psoriasis 02/14/2018  . Vitamin B 12 deficiency 02/14/2018  . Vitamin D deficiency 02/14/2018  . Anxiety and depression 02/03/2015  . Hyperlipemia 02/03/2015  . Obesity 02/03/2015  . Anemia due to chronic blood loss - reports history of evaluation and told due to menorrhagia 05/30/2014  . 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 Essential hypertension 08/22/2007  . GERD 08/22/2007  . ABFND PAP SMEAR HGSIL 08/22/2007   Past Medical History:  Diagnosis Date  . ABFND PAP SMEAR HGSIL 08/22/2007  . Anemia due to chronic blood loss - reports history of evaluation and told due to menorrhagia 05/30/2014  . ANXIETY 08/22/2007  . Anxiety and depression   . Anxiety and depression   . Diabetes 1.5, managed as type 2 ([place])   . Frequent headaches   . GERD 08/22/2007  . Hyperglycemia   . Hyperlipemia   . HYPERTENSION, BENIGN ESSENTIAL 08/22/2007  . 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 Obesity 02/03/2015  . PLANTAR FASCIITIS, RIGHT 07/01/2008  . PSORIASIS 08/22/2007    Family History  Problem Relation Age of Onset  . Diabetes Mother   . Hypertension Mother   . Fibroids Mother   . Heart disease Father   . Hypertension Father   . Heart attack Father   . Cancer Father        liver cancer  . Diabetes Brother   . Hypertension Brother   . Diabetes Brother  Social History   Occupational History  . Not on file  Tobacco Use  . Smoking status: Never  . Smokeless tobacco: Never  Vaping Use  . Vaping Use: Never used  Substance and Sexual Activity  . Alcohol use: No  . Drug use: No  . Sexual activity: Never    Partners: Male    Birth control/protection: None

## 2021-06-04 ENCOUNTER — Telehealth: Payer: Self-pay

## 2021-06-04 LAB — CYTOLOGY - PAP
Adequacy: ABSENT
Comment: NEGATIVE
Diagnosis: NEGATIVE
High risk HPV: NEGATIVE

## 2021-06-04 NOTE — Telephone Encounter (Signed)
Patient informed negative Pap/HPV results, next pap due in 3 years. Patient verbalized understanding.

## 2021-06-04 NOTE — Progress Notes (Signed)
Pap smear showed BV. Please call prescription to treat.

## 2021-06-05 ENCOUNTER — Other Ambulatory Visit: Payer: Self-pay

## 2021-06-05 ENCOUNTER — Other Ambulatory Visit: Payer: Self-pay | Admitting: Obstetrics & Gynecology

## 2021-06-05 DIAGNOSIS — N760 Acute vaginitis: Secondary | ICD-10-CM

## 2021-06-05 MED ORDER — METRONIDAZOLE 500 MG PO TABS
500.0000 mg | ORAL_TABLET | Freq: Two times a day (BID) | ORAL | 0 refills | Status: AC
  Filled 2021-06-05: qty 14, 7d supply, fill #0

## 2021-06-11 IMAGING — CR DG CHEST 2V
2 series · 2 of 2 positions shown · non-contrast
Comparison: 12/27/2016

CLINICAL DATA: Fever, chills, myalgias and diarrhea.

EXAM:
CHEST - 2 VIEW

[w chest pa]
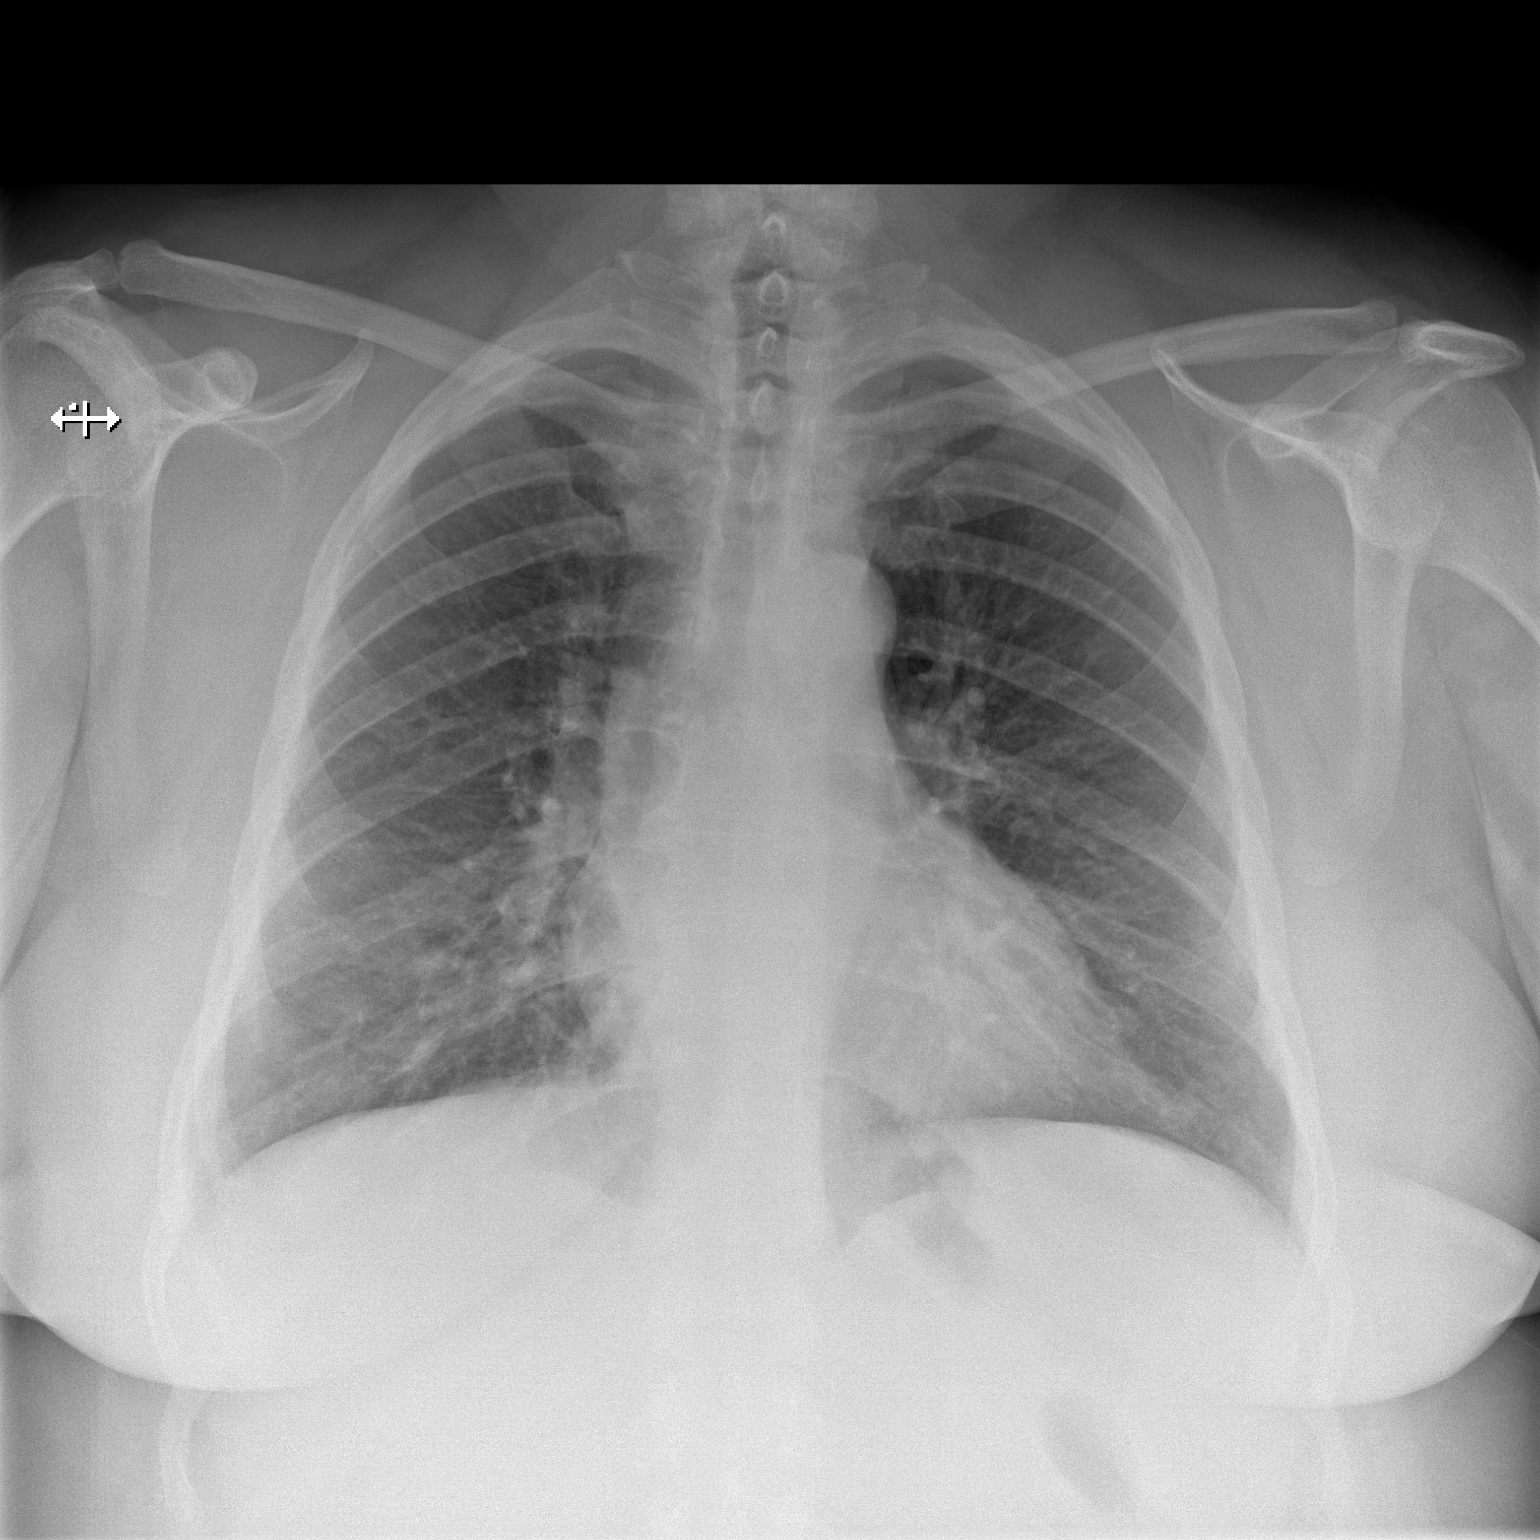

[w chest lat]
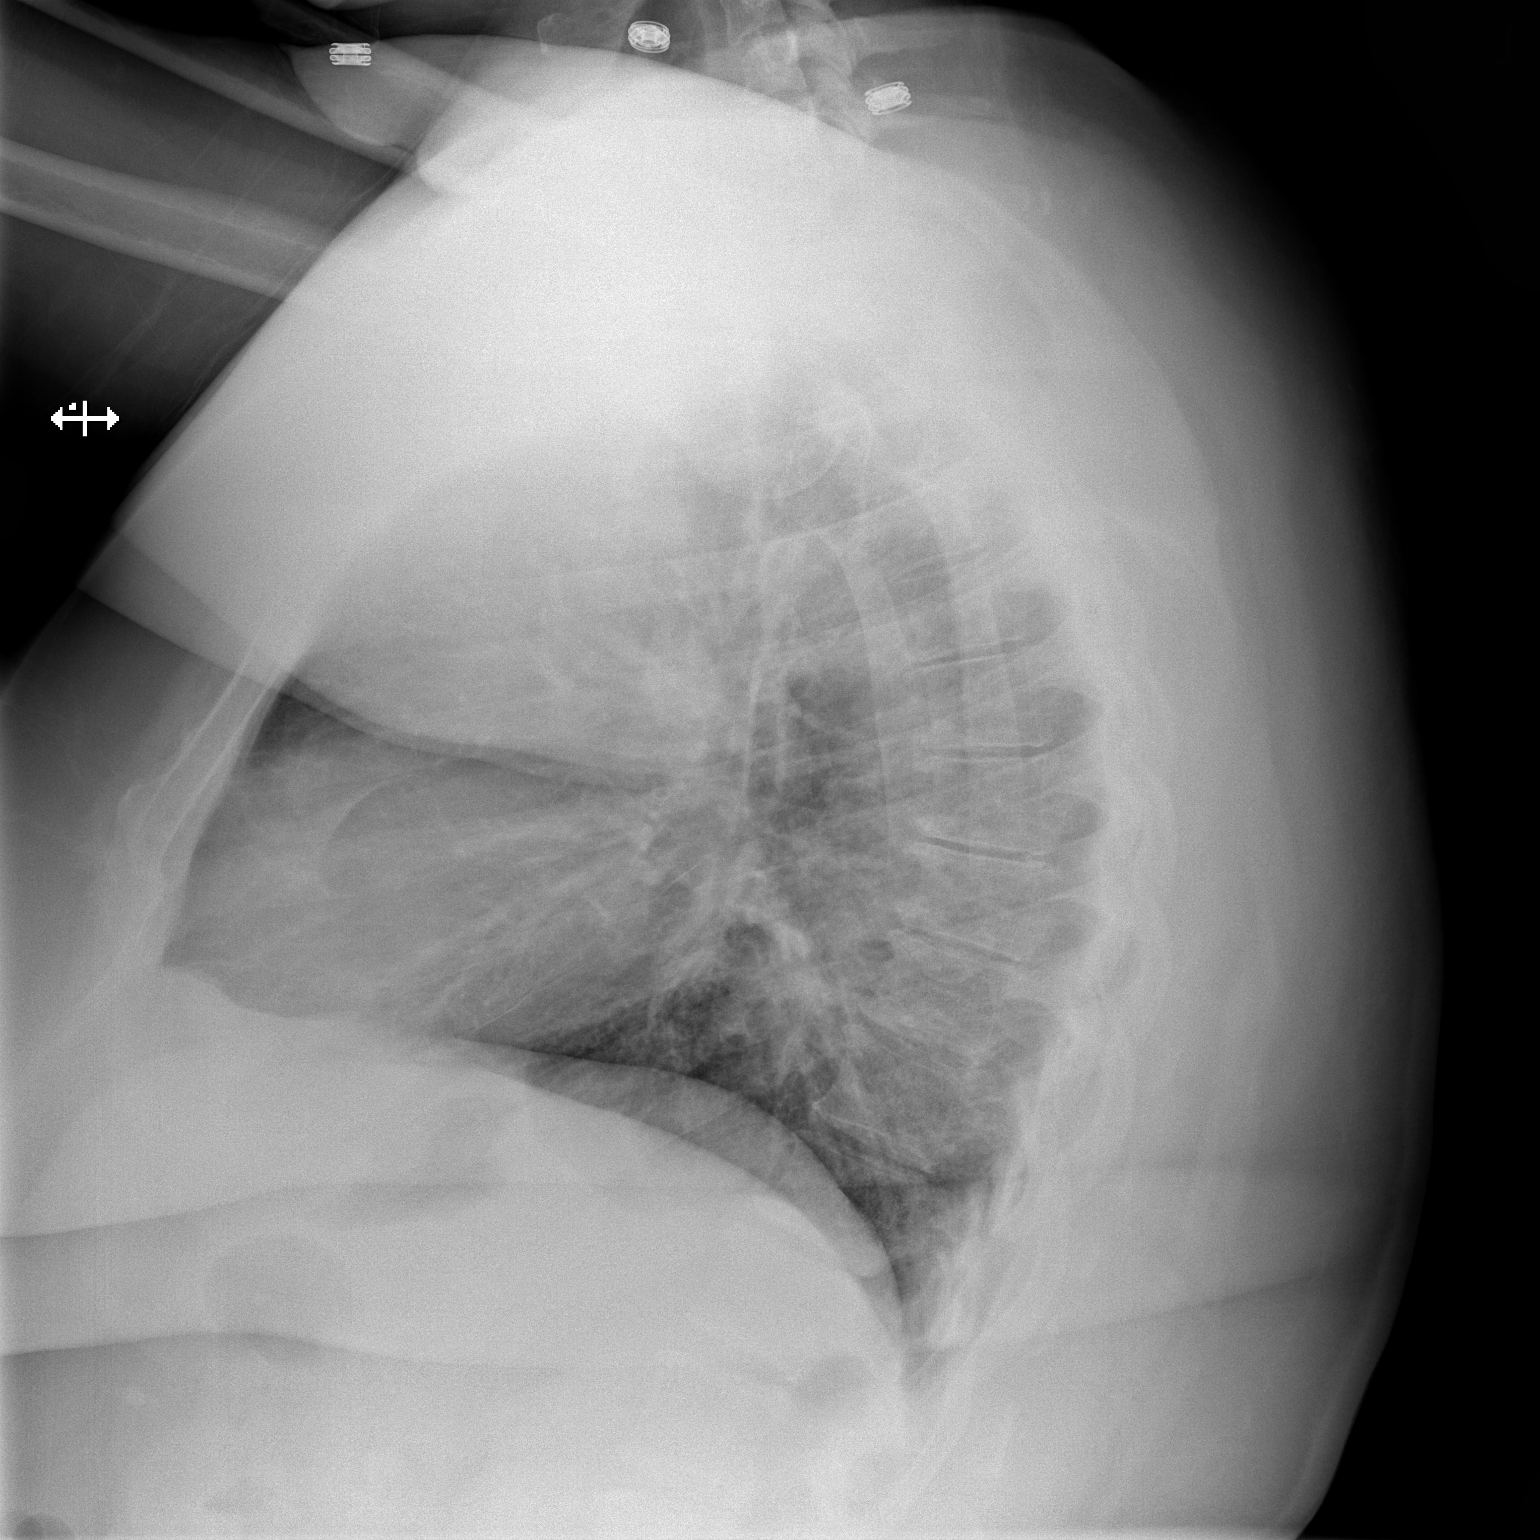

[2 of 2 positions shown; findings below may reference images not displayed]

FINDINGS: Normal sized heart. Clear lungs. Stable mild peribronchial
thickening. Mild thoracic spine degenerative changes.
IMPRESSION: No acute abnormality. Stable mild chronic bronchitic changes.

## 2021-06-21 ENCOUNTER — Other Ambulatory Visit: Payer: Self-pay | Admitting: Internal Medicine

## 2021-06-21 MED FILL — Lancets: 50 days supply | Qty: 100 | Fill #1 | Status: CN

## 2021-06-21 MED FILL — Losartan Potassium Tab 100 MG: ORAL | 30 days supply | Qty: 30 | Fill #2 | Status: AC

## 2021-06-22 ENCOUNTER — Other Ambulatory Visit: Payer: Self-pay

## 2021-06-22 MED ORDER — COLCHICINE 0.6 MG PO TABS
ORAL_TABLET | ORAL | 4 refills | Status: AC
  Filled 2021-06-22: qty 30, 30d supply, fill #0
  Filled 2021-07-27: qty 30, 30d supply, fill #1
  Filled 2022-06-14: qty 30, 30d supply, fill #0

## 2021-06-22 NOTE — Telephone Encounter (Signed)
  Notes to clinic:   review medication for continued use and refill    Requested Prescriptions  Pending Prescriptions Disp Refills   colchicine 0.6 MG tablet 30 tablet 4    Sig: TAKE ONE TABLET TONIGHT. IF NO IMPROVEMENT IN PAIN AFTER ONE HOUR REPEAT DOSE.      Endocrinology:  Gout Agents Failed - 06/21/2021  9:43 PM      Failed - Uric Acid in normal range and within 360 days    Uric Acid  Date Value Ref Range Status  05/26/2020 8.3 (H) 2.6 - 6.2 mg/dL Final    Comment:               Therapeutic target for gout patients: <6.0          Passed - Cr in normal range and within 360 days    Creatinine, Ser  Date Value Ref Range Status  10/09/2020 0.67 0.57 - 1.00 mg/dL Final          Passed - Valid encounter within last 12 months    Recent Outpatient Visits           1 month ago Prediabetes   [place], Deborah B, MD   8 months ago Fatigue, unspecified type   [place], [place], NP   12 months ago Essential hypertension   [place], Deborah B, MD   1 year ago Acute gout involving toe of right foot, unspecified cause   [place], [place], NP   1 year ago COVID-19 virus infection   [place], MD       Future Appointments             In 2 months Wynetta Emery Dalbert Batman, MD [place]

## 2021-06-23 ENCOUNTER — Encounter: Payer: Self-pay | Admitting: Internal Medicine

## 2021-06-24 ENCOUNTER — Other Ambulatory Visit: Payer: Self-pay

## 2021-06-25 ENCOUNTER — Other Ambulatory Visit: Payer: Self-pay

## 2021-06-25 ENCOUNTER — Ambulatory Visit: Payer: Self-pay | Attending: Internal Medicine

## 2021-06-25 DIAGNOSIS — E538 Deficiency of other specified B group vitamins: Secondary | ICD-10-CM

## 2021-06-25 MED ORDER — CYANOCOBALAMIN 1000 MCG/ML IJ SOLN
1000.0000 ug | Freq: Once | INTRAMUSCULAR | Status: AC
  Administered 2021-06-25: 1000 ug via INTRAMUSCULAR

## 2021-06-29 ENCOUNTER — Encounter: Payer: Self-pay | Admitting: Internal Medicine

## 2021-07-03 ENCOUNTER — Telehealth (INDEPENDENT_AMBULATORY_CARE_PROVIDER_SITE_OTHER): Payer: Self-pay

## 2021-07-03 ENCOUNTER — Other Ambulatory Visit: Payer: Self-pay

## 2021-07-03 ENCOUNTER — Encounter: Payer: Self-pay | Admitting: Internal Medicine

## 2021-07-03 ENCOUNTER — Ambulatory Visit: Payer: Self-pay | Attending: Internal Medicine | Admitting: Internal Medicine

## 2021-07-03 DIAGNOSIS — F331 Major depressive disorder, recurrent, moderate: Secondary | ICD-10-CM

## 2021-07-03 MED ORDER — ESCITALOPRAM OXALATE 10 MG PO TABS
ORAL_TABLET | ORAL | 1 refills | Status: DC
  Filled 2021-07-03: qty 30, 30d supply, fill #0
  Filled 2021-08-28: qty 30, 30d supply, fill #1

## 2021-07-03 NOTE — Telephone Encounter (Signed)
Attempted to reach patient. Voicemail full. She will need to discuss need for b12 injection at next OV. Nat Christen, CMA     Copied from [place] [id number]. Topic: Appointment Scheduling - Scheduling Inquiry for Clinic >> Jun 29, 2021 11:10 AM Erick Blinks wrote: [phone]  Pt called and is requesting to have her B12 shot. She says she missed a call from the office this morning. Called office, no answer. Please advise

## 2021-07-03 NOTE — Progress Notes (Signed)
Virtual Visit via Telephone Note  I connected with Janet [name] on 07/03/2021 at 8:38 AM by telephone and verified that I am speaking with the correct person using two identifiers  Location: Patient: home Provider: office  Participants: Myself Patient   I discussed the limitations, risks, security and privacy concerns of performing an evaluation and management service by telephone and the availability of in person appointments. I also discussed with the patient that there may be a patient responsible charge related to this service. The patient expressed understanding and agreed to proceed.   History of Present Illness: Pt with hx of pre-DM,HTN, HL, GERD, obesity, OSA, on CPAP, vit B12 def, vit D def, obesity, anx/dep, gout, psoriasis, melanoma, COVID-19 infection 03/2020.  Patient requested appointment today to discuss depression symptoms.  She was on Paxil in the past for depression after her father died.  She took it for a while.  However she has been off of it for almost a year.  She did not feel that it was helpful during the time that she was taking it.  Recently she has had increased depression due to family issues.  She is caring for her elderly mother.  She has other issues going on with her daughter and her 2 grandchildren.  She feels depressed and overwhelmed at times.  She overeats and attributes this to depression.  She lacks motivation to get up and get going in the mornings.  Denies any homicidal or suicidal ideation.   Observations/Objective:  Depression screen [facility] 2/9 05/14/2021 10/09/2020 06/23/2020  Decreased Interest '1 1 1  '$ Down, Depressed, Hopeless 1 0 1  PHQ - 2 Score '2 1 2  '$ Altered sleeping 2 0 1  Tired, decreased energy '2 3 1  '$ Change in appetite '2 2 1  '$ Feeling bad or failure about yourself  0 0 1  Trouble concentrating 0 1 1  Moving slowly or fidgety/restless 0 0 0  Suicidal thoughts 0 0 0  PHQ-9 Score '8 7 7  '$ Difficult doing work/chores - - -  Some recent  data might be hidden     Assessment and Plan: 1. Major depressive disorder, recurrent episode, moderate ([place]) Patient with recurrent depression. She is wanting to try a different oral antidepressant.  She is also agreeable to seeing behavioral health.  She denies any homicidal or suicidal ideation. We will start her on Lexapro.  I went over with her how to take it.  Advised patient that it can take up to 4 weeks before she starts feeling better on the medicine.  However if she experience any worsening depression or increased anxiety on the medicine she should stop the medicine and let me know. - escitalopram (LEXAPRO) 10 MG tablet; take 1/2 tab by mouth daily for 3 weeks then take 1 tablet daily  Dispense: 30 tablet; Refill: 1 - Ambulatory referral to Psychiatry   Follow Up Instructions: 6 wks   I discussed the assessment and treatment plan with the patient. The patient was provided an opportunity to ask questions and all were answered. The patient agreed with the plan and demonstrated an understanding of the instructions.   The patient was advised to call back or seek an in-person evaluation if the symptoms worsen or if the condition fails to improve as anticipated.  I  Spent 7 minutes on this telephone encounter  Karle Plumber, MD

## 2021-07-08 ENCOUNTER — Other Ambulatory Visit: Payer: Self-pay

## 2021-07-08 DIAGNOSIS — Z1231 Encounter for screening mammogram for malignant neoplasm of breast: Secondary | ICD-10-CM

## 2021-07-10 ENCOUNTER — Encounter: Payer: Self-pay | Admitting: Internal Medicine

## 2021-07-15 ENCOUNTER — Ambulatory Visit: Payer: No Typology Code available for payment source | Admitting: Physician Assistant

## 2021-07-22 ENCOUNTER — Institutional Professional Consult (permissible substitution): Payer: No Typology Code available for payment source | Admitting: Clinical

## 2021-07-27 ENCOUNTER — Ambulatory Visit: Payer: Self-pay | Attending: Internal Medicine

## 2021-07-27 ENCOUNTER — Other Ambulatory Visit: Payer: Self-pay

## 2021-07-27 DIAGNOSIS — E538 Deficiency of other specified B group vitamins: Secondary | ICD-10-CM

## 2021-07-27 MED FILL — Atorvastatin Calcium Tab 20 MG (Base Equivalent): ORAL | 30 days supply | Qty: 30 | Fill #2 | Status: AC

## 2021-07-27 MED FILL — Losartan Potassium Tab 100 MG: ORAL | 30 days supply | Qty: 30 | Fill #3 | Status: AC

## 2021-07-27 NOTE — Progress Notes (Signed)
Pt arrived at clinic for monthly B-12 injection. Pt given injection in left deltoid muscle. Pt tolerated shot well Pt to return in 1 month for b-12 injection.

## 2021-08-06 ENCOUNTER — Other Ambulatory Visit: Payer: Self-pay | Admitting: Obstetrics and Gynecology

## 2021-08-06 DIAGNOSIS — Z1231 Encounter for screening mammogram for malignant neoplasm of breast: Secondary | ICD-10-CM

## 2021-08-13 ENCOUNTER — Ambulatory Visit
Admission: RE | Admit: 2021-08-13 | Discharge: 2021-08-13 | Disposition: A | Discharge status: Home / Self Care | Payer: No Typology Code available for payment source | Source: Ambulatory Visit | Attending: Internal Medicine | Admitting: Internal Medicine

## 2021-08-13 ENCOUNTER — Other Ambulatory Visit: Payer: Self-pay

## 2021-08-13 ENCOUNTER — Ambulatory Visit: Payer: Self-pay | Admitting: *Deleted

## 2021-08-13 VITALS — BP 128/84 | Wt 303.0 lb

## 2021-08-13 DIAGNOSIS — Z1239 Encounter for other screening for malignant neoplasm of breast: Secondary | ICD-10-CM

## 2021-08-13 NOTE — Patient Instructions (Signed)
Explained breast self awareness with Janet [name]. Patient did not need a Pap smear today due to last Pap smear and HPV typing was 06/03/2021. Let patient know that her next Pap smear will be due in 3 years based on her history. Referred patient to the [facility] for a screening mammogram on mobile unit. Appointment scheduled Thursday, August 13, 2021 at 1540. Patient escorted to the mobile unit following [facility] appointment for her screening mammogram. Let patient know the [facility] will follow up with her within the next couple weeks with results of her mammogram by letter or phone. Janet [name] verbalized understanding.  Janet [name], Arvil Chaco, RN 3:18 PM

## 2021-08-13 NOTE — Progress Notes (Signed)
Ms. Janet [name] is a 50 y.o. female who presents to [facility] clinic today with no complaints.   Patient complained of heavy periods that last two weeks. Marjory Lies, LPN sent referral to the [facility] for [employer]. Told patient that [facility] would not cover the appointment. Patient has already completed the [facility] Application. Informed patient that the Center for [place] would call her with appointment.   Pap Smear: Pap smear not completed today. Last Pap smear was 06/03/2021 at the free cervical cancer screening clinic and was normal with negative HPV. 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 Patient has had five normal Pap smears completed since LEEP. Last five Pap smears, Colposcopy, and LEEP results are in EPIC.   Physical exam: Breasts Breasts symmetrical. No skin abnormalities bilateral breasts. No nipple retraction bilateral breasts. No nipple discharge bilateral breasts. No lymphadenopathy. No lumps palpated bilateral breasts. No complaints of pain or tenderness on exam.  MS DIGITAL SCREENING BILATERAL  Result Date: 02/11/2017 CLINICAL DATA:  Screening. EXAM: DIGITAL SCREENING BILATERAL MAMMOGRAM WITH CAD COMPARISON:  None. ACR Breast Density Category b: There are scattered areas of fibroglandular density. FINDINGS: In the right breast, a possible mass warrants further evaluation. In the left breast, no findings suspicious for malignancy. Images were processed with CAD. IMPRESSION: Further evaluation is suggested for possible mass in the right breast. RECOMMENDATION: Diagnostic mammogram and possibly ultrasound of the right breast. (Code:FI-R-69M) The patient will be contacted regarding the findings, and additional imaging will be scheduled. BI-RADS CATEGORY  0: Incomplete. Need additional imaging evaluation and/or  prior mammograms for comparison. Electronically Signed   By: Ammie Ferrier M.D.   On: 02/11/2017 15:06   MM DIAG BREAST TOMO UNI RIGHT  Result Date: 02/22/2017 CLINICAL DATA:  Patient returns after screening study for evaluation of possible right breast mass. EXAM: 2D DIGITAL DIAGNOSTIC UNILATERAL RIGHT MAMMOGRAM WITH CAD AND ADJUNCT TOMO COMPARISON:  02/10/2017 ACR Breast Density Category b: There are scattered areas of fibroglandular density. FINDINGS: Additional 2D/3D images are performed of the right breast, demonstrating no persistent abnormality in the retroareolar region of the right breast. No suspicious mass, distortion, or microcalcifications are identified to suggest presence of malignancy. Mammographic images were processed with CAD. IMPRESSION: No mammographic evidence for malignancy. RECOMMENDATION: Screening mammogram in one year.(Code:SM-B-01Y) I have discussed the findings and recommendations with the patient. Results were also provided in writing at the conclusion of the visit. If applicable, a reminder letter will be sent to the patient regarding the next appointment. BI-RADS CATEGORY  1: Negative. Electronically Signed   By: Nolon Nations M.D.   On: 02/22/2017 16:10   MS DIGITAL SCREENING TOMO BILATERAL  Result Date: 07/25/2020 CLINICAL DATA:  Screening. EXAM: DIGITAL SCREENING BILATERAL MAMMOGRAM WITH TOMO AND CAD COMPARISON:  Previous exam(s). ACR Breast Density Category b: There are scattered areas of fibroglandular density. FINDINGS: There are no findings suspicious for malignancy. Images were processed with CAD. IMPRESSION: No mammographic evidence of malignancy. A result letter of this screening mammogram will be mailed directly to the patient. RECOMMENDATION: Screening mammogram in one year. (Code:SM-B-01Y) BI-RADS CATEGORY  1: Negative. Electronically Signed   By: Margarette Canada M.D.   On: 07/25/2020 16:28   MS DIGITAL SCREENING TOMO BILATERAL  Result Date:  05/15/2019 CLINICAL DATA:  Screening. EXAM: DIGITAL SCREENING BILATERAL MAMMOGRAM WITH TOMO AND CAD  COMPARISON:  Previous exam(s). ACR Breast Density Category a: The breast tissue is almost entirely fatty. FINDINGS: There are no findings suspicious for malignancy. Images were processed with CAD. IMPRESSION: No mammographic evidence of malignancy. A result letter of this screening mammogram will be mailed directly to the patient. RECOMMENDATION: Screening mammogram in one year. (Code:SM-B-01Y) BI-RADS CATEGORY  1: Negative. Electronically Signed   By: Kristopher Oppenheim M.D.   On: 05/15/2019 09:38   MS DIGITAL SCREENING TOMO BILATERAL  Result Date: 05/12/2018 CLINICAL DATA:  Screening. EXAM: DIGITAL SCREENING BILATERAL MAMMOGRAM WITH TOMO AND CAD COMPARISON:  Previous exam(s). ACR Breast Density Category a: The breast tissue is almost entirely fatty. FINDINGS: There are no findings suspicious for malignancy. Images were processed with CAD. IMPRESSION: No mammographic evidence of malignancy. A result letter of this screening mammogram will be mailed directly to the patient. RECOMMENDATION: Screening mammogram in one year. (Code:SM-B-01Y) BI-RADS CATEGORY  1: Negative. Electronically Signed   By: Ammie Ferrier M.D.   On: 05/12/2018 08:04       Pelvic/Bimanual Pap is not indicated today per BCCCP guidelines.   Smoking History: Patient has never smoked.   Patient Navigation: Patient education provided. Access to services provided for patient through [facility] program.   Colorectal Cancer Screening: Per patient has never had colonoscopy completed. Patient stated she has a colonoscopy scheduled at [facility] on 08/31/2021. No complaints today.    Breast and Cervical Cancer Risk Assessment: Patient has no family history of breast cancer, known genetic mutations, or radiation treatment to the chest before age 6. Patient has a history of cervical dysplasia. Patient has no history of being immunocompromised or DES  exposure in-utero. Patient has a 5-year risk for breast cancer at 0.7% and lifetime risk at 8.6%.  Risk Assessment     Risk Scores       08/13/2021 07/24/2020   Last edited by: Demetrius Revel, LPN Royston Bake, CMA   5-year risk: 0.8 % 0.8 %   Lifetime risk: 8.3 % 8.4 %            A: B[facility]CP exam without pap smear No complaints.  P: Referred patient to the [facility] for a screening mammogram on mobile unit. Appointment scheduled Thursday, August 13, 2021 at 1540.  Loletta Parish, RN 08/13/2021 3:18 PM

## 2021-08-18 ENCOUNTER — Ambulatory Visit: Payer: No Typology Code available for payment source | Admitting: Internal Medicine

## 2021-08-19 ENCOUNTER — Institutional Professional Consult (permissible substitution): Payer: No Typology Code available for payment source | Admitting: Clinical

## 2021-08-27 ENCOUNTER — Ambulatory Visit: Payer: No Typology Code available for payment source

## 2021-08-27 ENCOUNTER — Ambulatory Visit: Payer: Self-pay | Attending: Internal Medicine

## 2021-08-27 ENCOUNTER — Other Ambulatory Visit: Payer: Self-pay

## 2021-08-27 DIAGNOSIS — E538 Deficiency of other specified B group vitamins: Secondary | ICD-10-CM

## 2021-08-28 ENCOUNTER — Other Ambulatory Visit: Payer: Self-pay | Admitting: Internal Medicine

## 2021-08-28 ENCOUNTER — Ambulatory Visit: Payer: Self-pay | Attending: Internal Medicine | Admitting: Pharmacist

## 2021-08-28 DIAGNOSIS — Z23 Encounter for immunization: Secondary | ICD-10-CM

## 2021-08-28 DIAGNOSIS — I10 Essential (primary) hypertension: Secondary | ICD-10-CM

## 2021-08-28 DIAGNOSIS — E538 Deficiency of other specified B group vitamins: Secondary | ICD-10-CM

## 2021-08-28 MED FILL — Atorvastatin Calcium Tab 20 MG (Base Equivalent): ORAL | 30 days supply | Qty: 30 | Fill #3 | Status: AC

## 2021-08-28 NOTE — Progress Notes (Signed)
Patient presents for vaccination against influenza per orders of Dr. Wynetta Emery. Consent given. Counseling provided. No contraindications exists. Vaccine administered without incident.   Benard Halsted, PharmD, [place], [place] [phone]

## 2021-08-29 NOTE — Telephone Encounter (Signed)
Requested medications are due for refill today requesting early  Requested medications are on the active medication list yes  Last refill 07/27/21 90 day supply  Last visit 05/2021  Future visit scheduled 09/14/21  Notes to clinic requesting early, please assess.

## 2021-08-31 ENCOUNTER — Encounter: Payer: No Typology Code available for payment source | Admitting: Gastroenterology

## 2021-08-31 ENCOUNTER — Other Ambulatory Visit: Payer: Self-pay

## 2021-08-31 MED ORDER — LOSARTAN POTASSIUM 100 MG PO TABS
100.0000 mg | ORAL_TABLET | Freq: Every day | ORAL | 0 refills | Status: DC
  Filled 2021-08-31: qty 30, 30d supply, fill #0

## 2021-09-02 ENCOUNTER — Other Ambulatory Visit: Payer: Self-pay

## 2021-09-14 ENCOUNTER — Encounter: Payer: Self-pay | Admitting: Internal Medicine

## 2021-09-14 ENCOUNTER — Other Ambulatory Visit: Payer: Self-pay

## 2021-09-14 ENCOUNTER — Ambulatory Visit: Payer: Self-pay | Attending: Internal Medicine | Admitting: Internal Medicine

## 2021-09-14 DIAGNOSIS — E782 Mixed hyperlipidemia: Secondary | ICD-10-CM

## 2021-09-14 DIAGNOSIS — N920 Excessive and frequent menstruation with regular cycle: Secondary | ICD-10-CM

## 2021-09-14 DIAGNOSIS — Z6841 Body Mass Index (BMI) 40.0 and over, adult: Secondary | ICD-10-CM

## 2021-09-14 DIAGNOSIS — I10 Essential (primary) hypertension: Secondary | ICD-10-CM

## 2021-09-14 DIAGNOSIS — E1169 Type 2 diabetes mellitus with other specified complication: Secondary | ICD-10-CM

## 2021-09-14 DIAGNOSIS — E538 Deficiency of other specified B group vitamins: Secondary | ICD-10-CM

## 2021-09-14 NOTE — Progress Notes (Signed)
Patient ID: Janet [name], female    DOB: 1971/10/19  MRN: [id number]  CC: Hypertension   Subjective: Janet [name] is a 50 y.o. female who presents for chronic ds management Her concerns today include:  Pt with hx of DM,HTN, HL, GERD, obesity, OSA, on CPAP, vit B12 def, vit D def, obesity, anx/dep, gout, psoriasis, melanoma, COVID-19 infection 03/2020.  Vit B12 level: getting shots once a mth.  Does not feel any difference   DM: reports she is doing better with eating habits.  not a lot of white stuff or fried foods.  Drinks mainly water and non-calorie flavored water -taking Amaryl Checks BS every morning.  Gives range  90-141 Goes to the gym to walk on the treadmill twice a week.  Would like to go more often but is helping to take care of her mother.  -Still has numbness in both feet.  HTN: Reports compliance with hydralazine, Cozaar, carvedilol and furosemide.  She limits salt in the foods.  Denies any chest pains or shortness of breath.  No lower extremity edema.  HL:  taking and tolerating Liptor.  OSA: using CPAP consistently.  She sometimes forgets to put it on at nights but that is infrequent..    Had PAP and MMG through [facility].  Complains of heavy cycles that she feels are becoming irregular.  She would have spotting on the first day and then no bleeding again until the third day.  At that time bleeding is very heavy where she has to change her pad and tampon about every 2 hours.  The bleeding would last for 6 days sometimes for 2 weeks.  She passes large clots during her cycles.  Reports cycles have always been heavy.  She has history of fibroid.  The RN who did her Pap smear told her that she was going to refer her to one of the gynecologist.  However there is no referral in the system.  HM: has appt schedule for c-scope 10/2021.  Due for Tdap.  She wants to postpone getting that until she comes for her next vitamin B12 shot and she will get them both at the same  time. Patient Active Problem List   Diagnosis Date Noted   Major depressive disorder, recurrent episode, moderate ([place]) 07/03/2021   Peripheral polyneuropathy 05/14/2021   COVID-19 virus infection 03/27/2020   Glaucoma suspect of both eyes 09/17/2019   Personal history of malignant melanoma of skin 09/17/2019   Prediabetes 02/19/2019   OSA (obstructive sleep apnea) 01/28/2019   Seborrheic dermatitis of scalp 06/15/2018   Psoriasis 02/14/2018   Vitamin B 12 deficiency 02/14/2018   Vitamin D deficiency 02/14/2018   Anxiety and depression 02/03/2015   Hyperlipemia 02/03/2015   Obesity 02/03/2015   Anemia due to chronic blood loss - reports history of evaluation and told due to menorrhagia 05/30/2014   MELANOMA, TRUNK 01/24/2008   Essential hypertension 08/22/2007   GERD 08/22/2007   ABFND PAP SMEAR HGSIL 08/22/2007     Current Outpatient Medications on File Prior to Visit  Medication Sig Dispense Refill   albuterol (PROVENTIL) (2.5 MG/3ML) 0.083% nebulizer solution Take 6 mLs (5 mg total) by nebulization every 4 (four) hours as needed for wheezing. (Patient not taking: Reported on 07/24/2020) 75 mL 0   albuterol (VENTOLIN HFA) 108 (90 Base) MCG/ACT inhaler Inhale 2 puffs into the lungs every 4 (four) hours as needed for wheezing or shortness of breath. (Patient not taking: Reported on 07/24/2020) 18 g 0  allopurinol (ZYLOPRIM) 100 MG tablet TAKE 1 TABLET (100 MG TOTAL) BY MOUTH DAILY. 30 tablet 3   atorvastatin (LIPITOR) 20 MG tablet TAKE 1 TABLET (20 MG TOTAL) BY MOUTH DAILY. 90 tablet 3   Blood Glucose Monitoring Suppl (TRUE METRIX METER) w/Device KIT Use as directed 1 kit 0   carvedilol (COREG) 3.125 MG tablet TAKE 1 TABLET (3.125 MG TOTAL) BY MOUTH 2 (TWO) TIMES DAILY WITH A MEAL. 60 tablet 3   cholecalciferol (VITAMIN D3) 25 MCG (1000 UNIT) tablet Take 1,000 Units by mouth daily.     colchicine 0.6 MG tablet TAKE ONE TABLET TONIGHT. IF NO IMPROVEMENT IN PAIN AFTER ONE HOUR REPEAT  DOSE. 30 tablet 4   escitalopram (LEXAPRO) 10 MG tablet take 1/2 tab by mouth daily for 3 weeks then take 1 tablet daily 30 tablet 1   furosemide (LASIX) 20 MG tablet TAKE 1 TABLET BY MOUTH AS NEEDED FOR SWELLING IN LEGS 30 tablet 3   gabapentin (NEURONTIN) 300 MG capsule Take 1 capsule (300 mg total) by mouth at bedtime. 30 capsule 3   glimepiride (AMARYL) 2 MG tablet Take 1 tablet (2 mg total) by mouth daily before breakfast. 30 tablet 6   glucose blood (TRUE METRIX BLOOD GLUCOSE TEST) test strip Use as instructed 100 each 12   hydrALAZINE (APRESOLINE) 10 MG tablet TAKE 1 TABLET (10 MG TOTAL) BY MOUTH 2 (TWO) TIMES DAILY. 60 tablet 3   losartan (COZAAR) 100 MG tablet Take 1 tablet (100 mg total) by mouth daily. 30 tablet 0   omeprazole (PRILOSEC) 20 MG capsule Take 1 capsule (20 mg total) by mouth daily as needed. (Patient not taking: Reported on 07/24/2020) 30 capsule 3   PARoxetine (PAXIL) 10 MG tablet Take 1 tablet (10 mg total) by mouth daily. (Patient not taking: Reported on 07/24/2020) 30 tablet 5   triamcinolone cream (KENALOG) 0.1 % Apply BID as needed to affected areas on extremities. (Patient not taking: Reported on 07/24/2020) 45 g 1   Current Facility-Administered Medications on File Prior to Visit  Medication Dose Route Frequency Provider Last Rate Last Admin   cyanocobalamin ((VITAMIN B-12)) injection 1,000 mcg  1,000 mcg Intramuscular Q30 days Ladell Pier, MD   1,000 mcg at 08/28/21 1700    Allergies  Allergen Reactions   Lidocaine Hypertension    ? Lidocaine allergy? Reports heart racing when has "shot of medicine in cevix" for leep   Hydralazine Other (See Comments)    Edema in legs   Lisinopril-Hydrochlorothiazide Other (See Comments)    Leg cramps.   Metformin And Related Diarrhea   Norvasc [Amlodipine Besylate] Other (See Comments)    Leg pain   Penicillins Itching    Social History   Socioeconomic History   Marital status: Single    Spouse name: Not on  file   Number of children: 1   Years of education: Not on file   Highest education level: Associate degree: academic program  Occupational History   Not on file  Tobacco Use   Smoking status: Never   Smokeless tobacco: Never  Vaping Use   Vaping Use: Never used  Substance and Sexual Activity   Alcohol use: No   Drug use: No   Sexual activity: Not Currently    Partners: Male    Birth control/protection: None  Other Topics Concern   Not on file  Social History Narrative   Work or School: daycare in infant room      Home Situation: lives alone  Spiritual Beliefs: none      Lifestyle: no regular exercise; diet is poor            Social Determinants of Radio broadcast assistant Strain: Not on file  Food Insecurity: No Food Insecurity   Worried About Charity fundraiser in the Last Year: Never true   Ran Out of Food in the Last Year: Never true  Transportation Needs: No Transportation Needs   Lack of Transportation (Medical): No   Lack of Transportation (Non-Medical): No  Physical Activity: Not on file  Stress: Not on file  Social Connections: Not on file  Intimate Partner Violence: Not on file    Family History  Problem Relation Age of Onset   Diabetes Mother    Hypertension Mother    Fibroids Mother    Heart disease Father    Hypertension Father    Heart attack Father    Cancer Father        liver cancer   Diabetes Brother    Hypertension Brother    Diabetes Brother    Breast cancer Neg Hx     Past Surgical History:  Procedure Laterality Date   LEEP  2009   CIN I negative margin   SKIN CANCER EXCISION  2010   chest area    ROS: Review of Systems Negative except as stated above  PHYSICAL EXAM: BP 109/80   Pulse 80   Resp 16   Wt (!) 303 lb 12.8 oz (137.8 kg)   SpO2 96%   BMI 47.58 kg/m   Wt Readings from Last 3 Encounters:  09/14/21 (!) 303 lb 12.8 oz (137.8 kg)  08/13/21 (!) 303 lb (137.4 kg)  05/14/21 298 lb 6.4 oz (135.4 kg)     Physical Exam  General appearance - alert, well appearing, obese middle-age Caucasian female and in no distress Mental status - normal mood, behavior, speech, dress, motor activity, and thought processes Neck - supple, no significant adenopathy Chest - clear to auscultation, no wheezes, rales or rhonchi, symmetric air entry Heart - normal rate, regular rhythm, normal S1, S2, no murmurs, rubs, clicks or gallops Extremities - peripheral pulses normal, no pedal edema, no clubbing or cyanosis Diabetic Foot Exam - Simple   Simple Foot Form Visual Inspection No deformities, no ulcerations, no other skin breakdown bilaterally: Yes Sensation Testing See comments: Yes Pulse Check Posterior Tibialis and Dorsalis pulse intact bilaterally: Yes Comments Decree sensation on the sole of both feet on leap exam.      CMP Latest Ref Rng & Units 10/09/2020 06/23/2020 03/25/2020  Glucose 65 - 99 mg/dL 100(H) - 104(H)  BUN 6 - 24 mg/dL 13 - 9  Creatinine 0.57 - 1.00 mg/dL 0.67 - 0.71  Sodium 134 - 144 mmol/L 141 - 138  Potassium 3.5 - 5.2 mmol/L 3.9 - 3.9  Chloride 96 - 106 mmol/L 103 - 101  CO2 20 - 29 mmol/L 25 - 24  Calcium 8.7 - 10.2 mg/dL 9.6 - 9.1  Total Protein 6.0 - 8.5 g/dL 6.9 7.0 -  Total Bilirubin 0.0 - 1.2 mg/dL <0.2 0.3 -  Alkaline Phos 44 - 121 IU/L 72 92 -  AST 0 - 40 IU/L 30 12 -  ALT 0 - 32 IU/L 31 15 -   Lipid Panel     Component Value Date/Time   CHOL 167 06/23/2020 1548   TRIG 100 06/23/2020 1548   HDL 68 06/23/2020 1548   CHOLHDL 2.5 06/23/2020 1548  CHOLHDL 4.0 12/14/2016 1421   VLDL 32 (H) 12/14/2016 1421   LDLCALC 81 06/23/2020 1548    CBC    Component Value Date/Time   WBC 7.2 10/09/2020 1657   WBC 6.1 03/25/2020 1609   RBC 4.35 10/09/2020 1657   RBC 4.09 03/25/2020 1609   HGB 12.6 10/09/2020 1657   HCT 38.6 10/09/2020 1657   PLT 298 10/09/2020 1657   MCV 89 10/09/2020 1657   MCH 29.0 10/09/2020 1657   MCH 30.8 03/25/2020 1609   MCHC 32.6  10/09/2020 1657   MCHC 31.8 03/25/2020 1609   RDW 15.4 10/09/2020 1657   LYMPHSABS 3.0 09/29/2017 1544   MONOABS 0.6 10/11/2016 0745   EOSABS 0.2 09/29/2017 1544   BASOSABS 0.0 09/29/2017 1544    ASSESSMENT AND PLAN: 1. Type 2 diabetes mellitus with morbid obesity (HCC) Blood sugar readings at goal.  Continue Amaryl, healthy eating habits and regular exercise. - CBC - Comprehensive metabolic panel - Lipid panel - Hemoglobin A1c  2. Essential hypertension At goal.  Continue current medications and low-salt diet.   3. Vitamin B12 deficiency Continue B12 shots.  Check B12 level today. - Vitamin B12  4. Mix Hyperlipidemia,  Continue Lipitor  5. Menorrhagia with regular cycle Likely due to fibroids plus a combination of being perimenopausal. - FSH/LH - US Pelvic Complete With Transvaginal; Future   Patient was given the opportunity to ask questions.  Patient verbalized understanding of the plan and was able to repeat key elements of the plan.   Orders Placed This Encounter  Procedures   US Pelvic Complete With Transvaginal   Vitamin B12   FSH/LH   CBC   Comprehensive metabolic panel   Lipid panel   Hemoglobin A1c     Requested Prescriptions    No prescriptions requested or ordered in this encounter    Return in about 4 months (around 01/15/2022) for pt to get Tdap when she comes for her next vit B12 shot.  Karle Plumber, MD, FACP

## 2021-09-15 LAB — COMPREHENSIVE METABOLIC PANEL
ALT: 24 IU/L (ref 0–32)
AST: 21 IU/L (ref 0–40)
Albumin/Globulin Ratio: 1.6 (ref 1.2–2.2)
Albumin: 4.3 g/dL (ref 3.8–4.8)
Alkaline Phosphatase: 97 IU/L (ref 44–121)
BUN/Creatinine Ratio: 16 (ref 9–23)
BUN: 10 mg/dL (ref 6–24)
Bilirubin Total: 0.2 mg/dL (ref 0.0–1.2)
CO2: 24 mmol/L (ref 20–29)
Calcium: 10 mg/dL (ref 8.7–10.2)
Chloride: 101 mmol/L (ref 96–106)
Creatinine, Ser: 0.62 mg/dL (ref 0.57–1.00)
Globulin, Total: 2.7 g/dL (ref 1.5–4.5)
Glucose: 105 mg/dL — ABNORMAL HIGH (ref 70–99)
Potassium: 4.2 mmol/L (ref 3.5–5.2)
Sodium: 141 mmol/L (ref 134–144)
Total Protein: 7 g/dL (ref 6.0–8.5)
eGFR: 110 mL/min/{1.73_m2} (ref >59)

## 2021-09-15 LAB — CBC
Hematocrit: 38.4 % (ref 34.0–46.6)
Hemoglobin: 12.9 g/dL (ref 11.1–15.9)
MCH: 32 pg (ref 26.6–33.0)
MCHC: 33.6 g/dL (ref 31.5–35.7)
MCV: 95 fL (ref 79–97)
Platelets: 378 10*3/uL (ref 150–450)
RBC: 4.03 x10E6/uL (ref 3.77–5.28)
RDW: 13.3 % (ref 11.7–15.4)
WBC: 8.2 10*3/uL (ref 3.4–10.8)

## 2021-09-15 LAB — HEMOGLOBIN A1C
Est. average glucose Bld gHb Est-mCnc: 134 mg/dL
Hgb A1c MFr Bld: 6.3 % — ABNORMAL HIGH (ref 4.8–5.6)

## 2021-09-15 LAB — LIPID PANEL
Chol/HDL Ratio: 3.1 ratio (ref 0.0–4.4)
Cholesterol, Total: 188 mg/dL (ref 100–199)
HDL: 60 mg/dL (ref >39)
LDL Chol Calc (NIH): 102 mg/dL — ABNORMAL HIGH (ref 0–99)
Triglycerides: 148 mg/dL (ref 0–149)
VLDL Cholesterol Cal: 26 mg/dL (ref 5–40)

## 2021-09-15 LAB — FSH/LH
FSH: 8 m[IU]/mL
LH: 5.1 m[IU]/mL

## 2021-09-15 LAB — VITAMIN B12: Vitamin B-12: 555 pg/mL (ref 232–1245)

## 2021-09-23 ENCOUNTER — Institutional Professional Consult (permissible substitution): Payer: No Typology Code available for payment source | Admitting: Clinical

## 2021-09-28 ENCOUNTER — Ambulatory Visit: Payer: Self-pay | Attending: Internal Medicine

## 2021-09-28 ENCOUNTER — Other Ambulatory Visit: Payer: Self-pay

## 2021-09-28 DIAGNOSIS — E538 Deficiency of other specified B group vitamins: Secondary | ICD-10-CM

## 2021-10-06 ENCOUNTER — Other Ambulatory Visit: Payer: Self-pay | Admitting: Internal Medicine

## 2021-10-06 DIAGNOSIS — I10 Essential (primary) hypertension: Secondary | ICD-10-CM

## 2021-10-06 DIAGNOSIS — F331 Major depressive disorder, recurrent, moderate: Secondary | ICD-10-CM

## 2021-10-06 DIAGNOSIS — G629 Polyneuropathy, unspecified: Secondary | ICD-10-CM

## 2021-10-06 DIAGNOSIS — Z8739 Personal history of other diseases of the musculoskeletal system and connective tissue: Secondary | ICD-10-CM

## 2021-10-06 MED ORDER — CARVEDILOL 3.125 MG PO TABS
ORAL_TABLET | Freq: Two times a day (BID) | ORAL | 3 refills | Status: DC
  Filled 2021-10-06: qty 60, 30d supply, fill #0
  Filled 2021-11-13: qty 60, 30d supply, fill #1
  Filled 2021-12-14: qty 60, 30d supply, fill #2
  Filled 2021-12-15 – 2021-12-23 (×2): qty 60, 30d supply, fill #0
  Filled 2022-01-25: qty 60, 30d supply, fill #1

## 2021-10-06 MED ORDER — ALLOPURINOL 100 MG PO TABS
ORAL_TABLET | Freq: Every day | ORAL | 3 refills | Status: DC
  Filled 2021-10-06: qty 30, 30d supply, fill #0
  Filled 2021-11-13: qty 30, 30d supply, fill #1
  Filled 2021-12-14: qty 30, 30d supply, fill #2
  Filled 2021-12-15 – 2021-12-23 (×2): qty 30, 30d supply, fill #0
  Filled 2022-01-25: qty 30, 30d supply, fill #1

## 2021-10-06 MED ORDER — GABAPENTIN 300 MG PO CAPS
300.0000 mg | ORAL_CAPSULE | Freq: Every day | ORAL | 3 refills | Status: DC
  Filled 2021-10-06: qty 30, 30d supply, fill #0
  Filled 2021-11-13: qty 30, 30d supply, fill #1
  Filled 2021-12-14: qty 30, 30d supply, fill #2
  Filled 2021-12-15 – 2021-12-23 (×2): qty 30, 30d supply, fill #0
  Filled 2022-01-25: qty 30, 30d supply, fill #1

## 2021-10-06 MED ORDER — ESCITALOPRAM OXALATE 10 MG PO TABS
ORAL_TABLET | ORAL | 1 refills | Status: DC
  Filled 2021-10-06: qty 30, 30d supply, fill #0
  Filled 2021-11-13: qty 30, 30d supply, fill #1

## 2021-10-06 MED ORDER — HYDRALAZINE HCL 10 MG PO TABS
ORAL_TABLET | Freq: Two times a day (BID) | ORAL | 3 refills | Status: DC
  Filled 2021-10-06: qty 60, 30d supply, fill #0
  Filled 2021-11-13: qty 60, 30d supply, fill #1
  Filled 2021-12-14: qty 60, 30d supply, fill #2
  Filled 2021-12-15 – 2021-12-23 (×2): qty 60, 30d supply, fill #0
  Filled 2022-01-25: qty 60, 30d supply, fill #1

## 2021-10-06 NOTE — Telephone Encounter (Signed)
Requested Prescriptions  Pending Prescriptions Disp Refills  . atorvastatin (LIPITOR) 20 MG tablet 90 tablet 3    Sig: TAKE 1 TABLET (20 MG TOTAL) BY MOUTH DAILY.     Cardiovascular:  Antilipid - Statins Failed - 10/06/2021  9:04 PM      Failed - LDL in normal range and within 360 days    LDL Chol Calc (NIH)  Date Value Ref Range Status  09/14/2021 102 (H) 0 - 99 mg/dL Final         Passed - Total Cholesterol in normal range and within 360 days    Cholesterol, Total  Date Value Ref Range Status  09/14/2021 188 100 - 199 mg/dL Final         Passed - HDL in normal range and within 360 days    HDL  Date Value Ref Range Status  09/14/2021 60 >39 mg/dL Final         Passed - Triglycerides in normal range and within 360 days    Triglycerides  Date Value Ref Range Status  09/14/2021 148 0 - 149 mg/dL Final         Passed - Patient is not pregnant      Passed - Valid encounter within last 12 months    Recent Outpatient Visits          3 weeks ago Type 2 diabetes mellitus with morbid obesity ([place])   [place] Karle Plumber B, MD   3 months ago Major depressive disorder, recurrent episode, moderate ([place])   [place], MD   4 months ago Prediabetes   [place], MD   12 months ago Fatigue, unspecified type   [place], [place], NP   1 year ago Essential hypertension   [place], MD      Future Appointments            In 3 months Wynetta Emery, Dalbert Batman, MD [place]           . furosemide (LASIX) 20 MG tablet 30 tablet 3    Sig: TAKE 1 TABLET BY MOUTH AS NEEDED FOR SWELLING IN LEGS     Cardiovascular:  Diuretics - Loop Passed - 10/06/2021  9:04 PM      Passed - K in normal range and within 360 days    Potassium  Date  Value Ref Range Status  09/14/2021 4.2 3.5 - 5.2 mmol/L Final         Passed - Ca in normal range and within 360 days    Calcium  Date Value Ref Range Status  09/14/2021 10.0 8.7 - 10.2 mg/dL Final   Calcium, Ion  Date Value Ref Range Status  10/11/2016 1.13 (L) 1.15 - 1.40 mmol/L Final         Passed - Na in normal range and within 360 days    Sodium  Date Value Ref Range Status  09/14/2021 141 134 - 144 mmol/L Final         Passed - Cr in normal range and within 360 days    Creatinine, Ser  Date Value Ref Range Status  09/14/2021 0.62 0.57 - 1.00 mg/dL Final         Passed - Last BP in normal range    BP Readings from Last  1 Encounters:  09/14/21 109/80         Passed - Valid encounter within last 6 months    Recent Outpatient Visits          3 weeks ago Type 2 diabetes mellitus with morbid obesity ([place])   [place] Karle Plumber B, MD   3 months ago Major depressive disorder, recurrent episode, moderate ([place])   [place], MD   4 months ago Prediabetes   [place], MD   12 months ago Fatigue, unspecified type   [place], [place], NP   1 year ago Essential hypertension   [place], MD      Future Appointments            In 3 months Wynetta Emery Dalbert Batman, MD [place]           . gabapentin (NEURONTIN) 300 MG capsule 30 capsule 3    Sig: Take 1 capsule (300 mg total) by mouth at bedtime.     Neurology: Anticonvulsants - gabapentin Passed - 10/06/2021  9:04 PM      Passed - Valid encounter within last 12 months    Recent Outpatient Visits          3 weeks ago Type 2 diabetes mellitus with morbid obesity ([place])   [place] Karle Plumber B, MD   3 months ago Major  depressive disorder, recurrent episode, moderate ([place])   [place], MD   4 months ago Prediabetes   [place], MD   12 months ago Fatigue, unspecified type   [place], [place], NP   1 year ago Essential hypertension   [place], MD      Future Appointments            In 3 months Wynetta Emery Dalbert Batman, MD [place]           . allopurinol (ZYLOPRIM) 100 MG tablet 30 tablet 3    Sig: TAKE 1 TABLET (100 MG TOTAL) BY MOUTH DAILY.     Endocrinology:  Gout Agents Failed - 10/06/2021  9:04 PM      Failed - Uric Acid in normal range and within 360 days    Uric Acid  Date Value Ref Range Status  05/26/2020 8.3 (H) 2.6 - 6.2 mg/dL Final    Comment:               Therapeutic target for gout patients: <6.0         Passed - Cr in normal range and within 360 days    Creatinine, Ser  Date Value Ref Range Status  09/14/2021 0.62 0.57 - 1.00 mg/dL Final         Passed - Valid encounter within last 12 months    Recent Outpatient Visits          3 weeks ago Type 2 diabetes mellitus with morbid obesity ([place])   [place] Karle Plumber B, MD   3 months ago Major depressive disorder, recurrent episode, moderate ([place])   [place],  Dalbert Batman, MD   4 months ago Prediabetes   [place], MD   12 months ago Fatigue, unspecified type   [place], [place], NP   1 year ago Essential hypertension   [place], MD      Future Appointments            In 3 months Wynetta Emery, Dalbert Batman, MD [place]           . carvedilol (COREG) 3.125  MG tablet 60 tablet 3    Sig: TAKE 1 TABLET (3.125 MG TOTAL) BY MOUTH 2 (TWO) TIMES DAILY WITH A MEAL.     Cardiovascular:  Beta Blockers Passed - 10/06/2021  9:04 PM      Passed - Last BP in normal range    BP Readings from Last 1 Encounters:  09/14/21 109/80         Passed - Last Heart Rate in normal range    Pulse Readings from Last 1 Encounters:  09/14/21 80         Passed - Valid encounter within last 6 months    Recent Outpatient Visits          3 weeks ago Type 2 diabetes mellitus with morbid obesity ([place])   [place] Karle Plumber B, MD   3 months ago Major depressive disorder, recurrent episode, moderate ([place])   [place], MD   4 months ago Prediabetes   [place], MD   12 months ago Fatigue, unspecified type   [place], [place], NP   1 year ago Essential hypertension   [place], MD      Future Appointments            In 3 months Wynetta Emery, Dalbert Batman, MD [place]           . hydrALAZINE (APRESOLINE) 10 MG tablet 60 tablet 3    Sig: TAKE 1 TABLET (10 MG TOTAL) BY MOUTH 2 (TWO) TIMES DAILY.     Cardiovascular:  Vasodilators Passed - 10/06/2021  9:04 PM      Passed - HCT in normal range and within 360 days    Hematocrit  Date Value Ref Range Status  09/14/2021 38.4 34.0 - 46.6 % Final         Passed - HGB in normal range and within 360 days    Hemoglobin  Date Value Ref Range Status  09/14/2021 12.9 11.1 - 15.9 g/dL Final         Passed - RBC in normal range and within 360 days    RBC  Date Value Ref Range Status  09/14/2021 4.03 3.77 - 5.28 x10E6/uL Final  03/25/2020 4.09 3.87 - 5.11 MIL/uL Final         Passed - WBC in normal range and within 360 days    WBC  Date Value Ref Range Status   09/14/2021 8.2 3.4 - 10.8 x10E3/uL Final  03/25/2020 6.1 4.0 - 10.5 K/uL Final         Passed - PLT in normal range and within 360 days    Platelets  Date Value Ref Range Status  09/14/2021 378 150 -  450 x10E3/uL Final         Passed - Last BP in normal range    BP Readings from Last 1 Encounters:  09/14/21 109/80         Passed - Valid encounter within last 12 months    Recent Outpatient Visits          3 weeks ago Type 2 diabetes mellitus with morbid obesity ([place])   [place] Karle Plumber B, MD   3 months ago Major depressive disorder, recurrent episode, moderate ([place])   [place], MD   4 months ago Prediabetes   [place], MD   12 months ago Fatigue, unspecified type   [place], [place], NP   1 year ago Essential hypertension   [place], MD      Future Appointments            In 3 months Wynetta Emery Dalbert Batman, MD [place]           . escitalopram (LEXAPRO) 10 MG tablet 30 tablet 1    Sig: take 1/2 tab by mouth daily for 3 weeks then take 1 tablet daily     Psychiatry:  Antidepressants - SSRI Passed - 10/06/2021  9:04 PM      Passed - Completed PHQ-2 or PHQ-9 in the last 360 days      Passed - Valid encounter within last 6 months    Recent Outpatient Visits          3 weeks ago Type 2 diabetes mellitus with morbid obesity ([place])   [place] Karle Plumber B, MD   3 months ago Major depressive disorder, recurrent episode, moderate ([place])   [place], MD   4 months ago Prediabetes   [place], MD   12 months ago Fatigue, unspecified type   [place], [place], NP   1 year ago Essential hypertension   [place], MD      Future Appointments            In 3 months Wynetta Emery, Dalbert Batman, MD [place]           . losartan (COZAAR) 100 MG tablet 30 tablet 0    Sig: Take 1 tablet (100 mg total) by mouth daily.     Cardiovascular:  Angiotensin Receptor Blockers Passed - 10/06/2021  9:04 PM      Passed - Cr in normal range and within 180 days    Creatinine, Ser  Date Value Ref Range Status  09/14/2021 0.62 0.57 - 1.00 mg/dL Final         Passed - K in normal range and within 180 days    Potassium  Date Value Ref Range Status  09/14/2021 4.2 3.5 - 5.2 mmol/L Final         Passed - Patient is not pregnant      Passed - Last BP in normal range    BP Readings from Last 1 Encounters:  09/14/21 109/80         Passed - Valid encounter within last 6 months  Recent Outpatient Visits          3 weeks ago Type 2 diabetes mellitus with morbid obesity [facility])   [place] Karle Plumber B, MD   3 months ago Major depressive disorder, recurrent episode, moderate ([place])   [place], MD   4 months ago Prediabetes   [place], MD   12 months ago Fatigue, unspecified type   [place], [place], NP   1 year ago Essential hypertension   [place], MD      Future Appointments            In 3 months Wynetta Emery Dalbert Batman, MD [place]

## 2021-10-06 NOTE — Telephone Encounter (Signed)
Requested medications are due for refill today.  yes  Requested medications are on the active medications list.  yes  Last refill. All 3 rx's are expired  Future visit scheduled.   yes  Notes to clinic.  Rx's are expired.

## 2021-10-07 ENCOUNTER — Other Ambulatory Visit: Payer: Self-pay

## 2021-10-07 ENCOUNTER — Ambulatory Visit (AMBULATORY_SURGERY_CENTER): Payer: Self-pay | Admitting: *Deleted

## 2021-10-07 VITALS — Ht 67.0 in | Wt 290.0 lb

## 2021-10-07 DIAGNOSIS — Z1211 Encounter for screening for malignant neoplasm of colon: Secondary | ICD-10-CM

## 2021-10-07 MED ORDER — FUROSEMIDE 20 MG PO TABS
ORAL_TABLET | ORAL | 3 refills | Status: DC
  Filled 2021-10-07: qty 30, 30d supply, fill #0
  Filled 2021-12-14: qty 30, 30d supply, fill #1
  Filled 2021-12-15 – 2021-12-23 (×2): qty 30, 30d supply, fill #0
  Filled 2022-06-14: qty 30, 30d supply, fill #1

## 2021-10-07 MED ORDER — LOSARTAN POTASSIUM 100 MG PO TABS
100.0000 mg | ORAL_TABLET | Freq: Every day | ORAL | 2 refills | Status: DC
  Filled 2021-10-07: qty 30, 30d supply, fill #0
  Filled 2021-11-13: qty 30, 30d supply, fill #1
  Filled 2021-12-14: qty 30, 30d supply, fill #2
  Filled 2021-12-15 – 2021-12-23 (×2): qty 30, 30d supply, fill #0

## 2021-10-07 MED ORDER — ATORVASTATIN CALCIUM 20 MG PO TABS
ORAL_TABLET | Freq: Every day | ORAL | 2 refills | Status: DC
  Filled 2021-10-07: qty 30, 30d supply, fill #0
  Filled 2021-11-13: qty 30, 30d supply, fill #1
  Filled 2021-12-14: qty 30, 30d supply, fill #2
  Filled 2021-12-15 – 2021-12-23 (×2): qty 30, 30d supply, fill #0
  Filled 2022-01-25: qty 30, 30d supply, fill #1
  Filled 2022-02-26: qty 30, 30d supply, fill #2
  Filled 2022-04-02: qty 30, 30d supply, fill #3
  Filled 2022-05-06: qty 30, 30d supply, fill #4
  Filled 2022-06-03 – 2022-06-14 (×4): qty 30, 30d supply, fill #5

## 2021-10-07 MED ORDER — PEG 3350-KCL-NA BICARB-NACL 420 G PO SOLR
4000.0000 mL | Freq: Once | ORAL | 0 refills | Status: DC
  Filled 2021-10-07: qty 4000, 1d supply, fill #0

## 2021-10-07 NOTE — Progress Notes (Signed)
Pt's previsit is done over the phone and all paperwork (prep instructions, blank consent form to just read over) sent to patient. And MyChart. Pt's name and DOB verified at the beginning of the previsit.  Pt denies any difficulty with ambulating.    No trouble with sedation, denies moving neck, or hx/fam hx of malignant hyperthermia per pt   No egg or soy allergy  No home oxygen use   No medications for weight loss taken  emmi information given  Pt denies constipation issues  Pt informed that we do not do prior authorizations for prep

## 2021-10-08 ENCOUNTER — Other Ambulatory Visit: Payer: Self-pay

## 2021-10-14 ENCOUNTER — Other Ambulatory Visit: Payer: Self-pay

## 2021-10-19 ENCOUNTER — Other Ambulatory Visit: Payer: Self-pay

## 2021-10-19 ENCOUNTER — Telehealth: Payer: Self-pay | Admitting: Gastroenterology

## 2021-10-19 DIAGNOSIS — Z1211 Encounter for screening for malignant neoplasm of colon: Secondary | ICD-10-CM

## 2021-10-19 MED ORDER — PEG 3350-KCL-NA BICARB-NACL 420 G PO SOLR
4000.0000 mL | Freq: Once | ORAL | 0 refills | Status: AC
Start: 2021-10-19 — End: 2021-10-19
  Filled 2021-10-19: qty 4000, 1d supply, fill #0

## 2021-10-19 NOTE — Telephone Encounter (Signed)
Resent  [place] at pt's request

## 2021-10-19 NOTE — Telephone Encounter (Signed)
Patient called to reschedule colonoscopy currently on for 12/16/21. Said she did not pick up the prep medication therefore they placed it back in. Please resend.

## 2021-10-20 ENCOUNTER — Other Ambulatory Visit: Payer: Self-pay

## 2021-10-20 ENCOUNTER — Encounter: Payer: No Typology Code available for payment source | Admitting: Gastroenterology

## 2021-10-28 ENCOUNTER — Other Ambulatory Visit: Payer: Self-pay

## 2021-11-02 ENCOUNTER — Encounter: Payer: Self-pay | Admitting: Internal Medicine

## 2021-11-04 ENCOUNTER — Ambulatory Visit: Payer: Self-pay | Attending: Internal Medicine

## 2021-11-04 ENCOUNTER — Other Ambulatory Visit: Payer: Self-pay

## 2021-11-04 ENCOUNTER — Ambulatory Visit: Payer: No Typology Code available for payment source | Admitting: Physician Assistant

## 2021-11-04 DIAGNOSIS — Z111 Encounter for screening for respiratory tuberculosis: Secondary | ICD-10-CM

## 2021-11-04 NOTE — Progress Notes (Signed)
Tuberculin skin test applied to left ventral forearm.  Patient informed to schedule appt for nurse visit in 48-72 hours to have site read.  Jackelyn Knife, RMA

## 2021-11-06 LAB — TB SKIN TEST
Induration: 0 mm
TB Skin Test: NEGATIVE

## 2021-11-12 ENCOUNTER — Encounter: Payer: Self-pay | Admitting: Internal Medicine

## 2021-11-13 ENCOUNTER — Other Ambulatory Visit: Payer: Self-pay

## 2021-11-16 ENCOUNTER — Ambulatory Visit: Payer: No Typology Code available for payment source | Admitting: Internal Medicine

## 2021-12-07 ENCOUNTER — Encounter: Payer: Self-pay | Admitting: Internal Medicine

## 2021-12-14 ENCOUNTER — Other Ambulatory Visit: Payer: Self-pay | Admitting: Internal Medicine

## 2021-12-14 DIAGNOSIS — F331 Major depressive disorder, recurrent, moderate: Secondary | ICD-10-CM

## 2021-12-15 ENCOUNTER — Other Ambulatory Visit: Payer: Self-pay

## 2021-12-15 MED ORDER — ESCITALOPRAM OXALATE 10 MG PO TABS
ORAL_TABLET | ORAL | 1 refills | Status: DC
  Filled 2021-12-15: qty 30, 30d supply, fill #0
  Filled 2021-12-15: qty 30, fill #0
  Filled 2021-12-23: qty 30, 30d supply, fill #0
  Filled 2022-01-25: qty 30, 30d supply, fill #1

## 2021-12-15 NOTE — Telephone Encounter (Signed)
Requested Prescriptions  Pending Prescriptions Disp Refills   escitalopram (LEXAPRO) 10 MG tablet 30 tablet 1    Sig: Take 1/2 tab by mouth daily for 3 weeks then take 1 tablet daily     Psychiatry:  Antidepressants - SSRI Passed - 12/14/2021 10:22 PM      Passed - Completed PHQ-2 or PHQ-9 in the last 360 days      Passed - Valid encounter within last 6 months    Recent Outpatient Visits          3 months ago Type 2 diabetes mellitus with morbid obesity ([place])   [place] Karle Plumber B, MD   5 months ago Major depressive disorder, recurrent episode, moderate ([place])   [place], MD   7 months ago Prediabetes   [place], MD   1 year ago Fatigue, unspecified type   [place], [place], NP   1 year ago Essential hypertension   [place], MD      Future Appointments            In 1 month Wynetta Emery, Dalbert Batman, MD [place]

## 2021-12-16 ENCOUNTER — Telehealth: Payer: Self-pay | Admitting: Gastroenterology

## 2021-12-16 ENCOUNTER — Encounter: Payer: No Typology Code available for payment source | Admitting: Gastroenterology

## 2021-12-16 NOTE — Telephone Encounter (Signed)
Janet [name],  could you charge NO SHOW Fee please

## 2021-12-16 NOTE — Telephone Encounter (Signed)
Good Afternoon Dr. Fuller Plan,  I tried calling this patient to see if she was coming for her appointment @10 .45 am .  Her mailbox was full and I could not leave a message.  I No  Showed her for today.

## 2021-12-16 NOTE — Telephone Encounter (Signed)
Please charge her for [facility] no show.

## 2021-12-21 ENCOUNTER — Other Ambulatory Visit: Payer: Self-pay

## 2021-12-23 ENCOUNTER — Other Ambulatory Visit: Payer: Self-pay

## 2021-12-23 ENCOUNTER — Encounter: Payer: Self-pay | Admitting: Internal Medicine

## 2021-12-24 ENCOUNTER — Other Ambulatory Visit: Payer: Self-pay

## 2021-12-31 ENCOUNTER — Encounter: Payer: Self-pay | Admitting: Internal Medicine

## 2022-01-01 ENCOUNTER — Other Ambulatory Visit: Payer: Self-pay

## 2022-01-01 ENCOUNTER — Ambulatory Visit: Payer: Self-pay | Attending: Internal Medicine | Admitting: Internal Medicine

## 2022-01-01 ENCOUNTER — Telehealth: Payer: No Typology Code available for payment source | Admitting: Internal Medicine

## 2022-01-01 DIAGNOSIS — H10021 Other mucopurulent conjunctivitis, right eye: Secondary | ICD-10-CM

## 2022-01-01 MED ORDER — POLYMYXIN B-TRIMETHOPRIM 10000-0.1 UNIT/ML-% OP SOLN
1.0000 [drp] | Freq: Four times a day (QID) | OPHTHALMIC | 0 refills | Status: DC
  Filled 2022-01-01: qty 10, 7d supply, fill #0

## 2022-01-01 NOTE — Progress Notes (Signed)
Virtual Visit via Video Note  I connected with Janet [name] on 01/01/2022 at 10:08 AM by a video enabled telemedicine application and verified that I am speaking with the correct person using two identifiers.  Location: Patient: home Provider: Office   I discussed the limitations of evaluation and management by telemedicine and the availability of in person appointments. The patient expressed understanding and agreed to proceed.  History of Present Illness: Pt with hx of DM,HTN, HL, GERD, obesity, OSA, on CPAP, vit B12 def, vit D def, obesity, anx/dep, gout, psoriasis, melanoma, COVID-19 infection 03/2020.  This is an urgent care visit.  Patient reached out to me via MyChart last night informing me that she has pinkeye. Patient states that she has redness and feeling of thick mucus in the right eye that started yesterday.  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  She woke up this morning and noted that the right eyelid was matted shut with crusty material.  She had stringy white material that she could pull out of the eye.  She has redness of the eye.  She denies any pain or photophobia.  No discolored drainage from the eye.  She does not wear contacts.    Outpatient Encounter Medications as of 01/01/2022  Medication Sig   albuterol (PROVENTIL) (2.5 MG/3ML) 0.083% nebulizer solution Take 6 mLs (5 mg total) by nebulization every 4 (four) hours as needed for wheezing. (Patient not taking: No sig reported)   albuterol (VENTOLIN HFA) 108 (90 Base) MCG/ACT inhaler Inhale 2 puffs into the lungs every 4 (four) hours as needed for wheezing or shortness of breath. (Patient not taking: No sig reported)   allopurinol (ZYLOPRIM) 100 MG tablet TAKE 1 TABLET (100 MG TOTAL) BY MOUTH DAILY.   atorvastatin (LIPITOR) 20 MG tablet TAKE 1 TABLET (20 MG TOTAL) BY MOUTH DAILY.   Blood Glucose Monitoring Suppl (TRUE METRIX METER) w/Device KIT Use as directed    carvedilol (COREG) 3.125 MG tablet TAKE 1 TABLET (3.125 MG TOTAL) BY MOUTH 2 (TWO) TIMES DAILY WITH A MEAL.   cholecalciferol (VITAMIN D3) 25 MCG (1000 UNIT) tablet Take 1,000 Units by mouth daily.   colchicine 0.6 MG tablet TAKE ONE TABLET TONIGHT. IF NO IMPROVEMENT IN PAIN AFTER ONE HOUR REPEAT DOSE.   escitalopram (LEXAPRO) 10 MG tablet Take 1/2 tab by mouth daily for 3 weeks then take 1 tablet daily   furosemide (LASIX) 20 MG tablet TAKE 1 TABLET BY MOUTH AS NEEDED FOR SWELLING IN LEGS   gabapentin (NEURONTIN) 300 MG capsule Take 1 capsule (300 mg total) by mouth at bedtime.   glimepiride (AMARYL) 2 MG tablet Take 1 tablet (2 mg total) by mouth daily before breakfast.   glucose blood (TRUE METRIX BLOOD GLUCOSE TEST) test strip Use as instructed   hydrALAZINE (APRESOLINE) 10 MG tablet TAKE 1 TABLET (10 MG TOTAL) BY MOUTH 2 (TWO) TIMES DAILY.   losartan (COZAAR) 100 MG tablet Take 1 tablet (100 mg total) by mouth daily.   omeprazole (PRILOSEC) 20 MG capsule Take 1 capsule (20 mg total) by mouth daily as needed. (Patient not taking: No sig reported)   PARoxetine (PAXIL) 10 MG tablet Take 1 tablet (10 mg total) by mouth daily. (Patient not taking: No sig reported)   triamcinolone cream (KENALOG) 0.1 % Apply BID as needed to affected areas on extremities. (Patient not taking: No sig reported)   Facility-Administered Encounter Medications as of 01/01/2022  Medication  cyanocobalamin ((VITAMIN B-12)) injection 1,000 mcg      Observations/Objective: 62 Caucasian female sitting in the chair.  She was able to bring the camera up close to her eyes.  She is noted to have some redness to the eye.  Assessment and Plan: 1. Pink eye disease of right eye Commend that she stay out of work today and over the weekend.  Letter written for her.  Warm compresses to the eyes several times a day.  I have sent a prescription to the pharmacy for some antibiotic eyedrops. - trimethoprim-polymyxin b  (POLYTRIM) ophthalmic solution; Place 1 drop into the right eye every 6 (six) hours. For 5-7 days  Dispense: 10 mL; Refill: 0   Follow Up Instructions: Follow-up if no improvement.   I discussed the assessment and treatment plan with the patient. The patient was provided an opportunity to ask questions and all were answered. The patient agreed with the plan and demonstrated an understanding of the instructions.   The patient was advised to call back or seek an in-person evaluation if the symptoms worsen or if the condition fails to improve as anticipated.  I spent 9 minutes dedicated to the care of this patient on the date of this encounter to include previsit review of of her MyChart message, face-to-face time with the patient discussing diagnosis and management and post visit placement of orders.  This note has been created with Surveyor, quantity. Any transcriptional errors are unintentional.  Karle Plumber, MD

## 2022-01-15 ENCOUNTER — Ambulatory Visit: Payer: No Typology Code available for payment source | Admitting: Internal Medicine

## 2022-01-25 ENCOUNTER — Other Ambulatory Visit: Payer: Self-pay | Admitting: Internal Medicine

## 2022-01-25 DIAGNOSIS — R7303 Prediabetes: Secondary | ICD-10-CM

## 2022-01-25 DIAGNOSIS — I10 Essential (primary) hypertension: Secondary | ICD-10-CM

## 2022-01-26 ENCOUNTER — Other Ambulatory Visit: Payer: Self-pay

## 2022-01-26 MED ORDER — LOSARTAN POTASSIUM 100 MG PO TABS
100.0000 mg | ORAL_TABLET | Freq: Every day | ORAL | 2 refills | Status: DC
  Filled 2022-01-26: qty 30, 30d supply, fill #0
  Filled 2022-02-26: qty 30, 30d supply, fill #1
  Filled 2022-04-02: qty 30, 30d supply, fill #2

## 2022-01-26 MED ORDER — GLIMEPIRIDE 2 MG PO TABS
2.0000 mg | ORAL_TABLET | Freq: Every day | ORAL | 2 refills | Status: DC
  Filled 2022-01-26 (×3): qty 30, 30d supply, fill #0
  Filled 2022-02-26: qty 30, 30d supply, fill #1

## 2022-01-26 NOTE — Telephone Encounter (Signed)
Requested Prescriptions  Pending Prescriptions Disp Refills   glimepiride (AMARYL) 2 MG tablet 30 tablet 2    Sig: Take 1 tablet (2 mg total) by mouth daily before breakfast.     Endocrinology:  Diabetes - Sulfonylureas Passed - 01/25/2022  9:41 PM      Passed - HBA1C is between 0 and 7.9 and within 180 days    HbA1c, POC (prediabetic range)  Date Value Ref Range Status  05/14/2021 5.9 5.7 - 6.4 % Final   Hgb A1c MFr Bld  Date Value Ref Range Status  09/14/2021 6.3 (H) 4.8 - 5.6 % Final    Comment:             Prediabetes: 5.7 - 6.4          Diabetes: >6.4          Glycemic control for adults with diabetes: <7.0          Passed - Cr in normal range and within 360 days    Creatinine, Ser  Date Value Ref Range Status  09/14/2021 0.62 0.57 - 1.00 mg/dL Final         Passed - Valid encounter within last 6 months    Recent Outpatient Visits          3 weeks ago Pink eye disease of right eye   [place], Neoma Laming B, MD   4 months ago Type 2 diabetes mellitus with morbid obesity ([place])   [place] Karle Plumber B, MD   6 months ago Major depressive disorder, recurrent episode, moderate ([place])   [place] Ladell Pier, MD   8 months ago Prediabetes   [place] Ladell Pier, MD   1 year ago Fatigue, unspecified type   [place], [place], NP      Future Appointments            In 1 month Ladell Pier, MD [place]            losartan (COZAAR) 100 MG tablet 30 tablet 2    Sig: Take 1 tablet (100 mg total) by mouth daily.     Cardiovascular:  Angiotensin Receptor Blockers Passed - 01/25/2022  9:41 PM      Passed - Cr in normal range and within 180 days    Creatinine, Ser  Date Value Ref Range Status  09/14/2021 0.62 0.57 - 1.00 mg/dL Final          Passed - K in normal range and within 180 days    Potassium  Date Value Ref Range Status  09/14/2021 4.2 3.5 - 5.2 mmol/L Final         Passed - Patient is not pregnant      Passed - Last BP in normal range    BP Readings from Last 1 Encounters:  09/14/21 109/80         Passed - Valid encounter within last 6 months    Recent Outpatient Visits          3 weeks ago Pink eye disease of right eye   [place], MD   4 months ago Type 2 diabetes mellitus with morbid obesity [facility])   [place] Ladell Pier, MD   6 months ago Major  depressive disorder, recurrent episode, moderate ([place])   [place], MD   8 months ago Prediabetes   [place], MD   1 year ago Fatigue, unspecified type   [place], Amy J, NP      Future Appointments            In 1 month Wynetta Emery, Dalbert Batman, MD [place]

## 2022-01-26 NOTE — Telephone Encounter (Signed)
Requested Prescriptions  Pending Prescriptions Disp Refills   glimepiride (AMARYL) 2 MG tablet 30 tablet 2    Sig: Take 1 tablet (2 mg total) by mouth daily before breakfast.     Endocrinology:  Diabetes - Sulfonylureas Passed - 01/25/2022  9:41 PM      Passed - HBA1C is between 0 and 7.9 and within 180 days    HbA1c, POC (prediabetic range)  Date Value Ref Range Status  05/14/2021 5.9 5.7 - 6.4 % Final   Hgb A1c MFr Bld  Date Value Ref Range Status  09/14/2021 6.3 (H) 4.8 - 5.6 % Final    Comment:             Prediabetes: 5.7 - 6.4          Diabetes: >6.4          Glycemic control for adults with diabetes: <7.0          Passed - Cr in normal range and within 360 days    Creatinine, Ser  Date Value Ref Range Status  09/14/2021 0.62 0.57 - 1.00 mg/dL Final         Passed - Valid encounter within last 6 months    Recent Outpatient Visits          3 weeks ago Pink eye disease of right eye   [place], Neoma Laming B, MD   4 months ago Type 2 diabetes mellitus with morbid obesity ([place])   [place] Karle Plumber B, MD   6 months ago Major depressive disorder, recurrent episode, moderate ([place])   [place] Ladell Pier, MD   8 months ago Prediabetes   [place] Ladell Pier, MD   1 year ago Fatigue, unspecified type   [place], [place], NP      Future Appointments            In 1 month Ladell Pier, MD [place]            losartan (COZAAR) 100 MG tablet 30 tablet 2    Sig: Take 1 tablet (100 mg total) by mouth daily.     Cardiovascular:  Angiotensin Receptor Blockers Passed - 01/25/2022  9:41 PM      Passed - Cr in normal range and within 180 days    Creatinine, Ser  Date Value Ref Range Status  09/14/2021 0.62 0.57 - 1.00 mg/dL Final          Passed - K in normal range and within 180 days    Potassium  Date Value Ref Range Status  09/14/2021 4.2 3.5 - 5.2 mmol/L Final         Passed - Patient is not pregnant      Passed - Last BP in normal range    BP Readings from Last 1 Encounters:  09/14/21 109/80         Passed - Valid encounter within last 6 months    Recent Outpatient Visits          3 weeks ago Pink eye disease of right eye   [place], MD   4 months ago Type 2 diabetes mellitus with morbid obesity [facility])   [place] Ladell Pier, MD   6 months ago Major  depressive disorder, recurrent episode, moderate ([place])   [place], MD   8 months ago Prediabetes   [place], MD   1 year ago Fatigue, unspecified type   [place], Amy J, NP      Future Appointments            In 1 month Wynetta Emery, Dalbert Batman, MD [place]

## 2022-02-26 ENCOUNTER — Other Ambulatory Visit: Payer: Self-pay | Admitting: Internal Medicine

## 2022-02-26 DIAGNOSIS — Z8739 Personal history of other diseases of the musculoskeletal system and connective tissue: Secondary | ICD-10-CM

## 2022-02-26 DIAGNOSIS — I10 Essential (primary) hypertension: Secondary | ICD-10-CM

## 2022-02-26 DIAGNOSIS — G629 Polyneuropathy, unspecified: Secondary | ICD-10-CM

## 2022-02-26 DIAGNOSIS — F331 Major depressive disorder, recurrent, moderate: Secondary | ICD-10-CM

## 2022-03-01 ENCOUNTER — Other Ambulatory Visit: Payer: Self-pay

## 2022-03-01 MED ORDER — ESCITALOPRAM OXALATE 10 MG PO TABS
10.0000 mg | ORAL_TABLET | Freq: Every day | ORAL | 0 refills | Status: DC
  Filled 2022-03-01: qty 30, 30d supply, fill #0

## 2022-03-01 MED ORDER — ALLOPURINOL 100 MG PO TABS
ORAL_TABLET | Freq: Every day | ORAL | 0 refills | Status: DC
  Filled 2022-03-01: qty 30, 30d supply, fill #0

## 2022-03-01 MED ORDER — HYDRALAZINE HCL 10 MG PO TABS
ORAL_TABLET | Freq: Two times a day (BID) | ORAL | 0 refills | Status: DC
  Filled 2022-03-01: qty 60, 30d supply, fill #0

## 2022-03-01 MED ORDER — GABAPENTIN 300 MG PO CAPS
300.0000 mg | ORAL_CAPSULE | Freq: Every day | ORAL | 3 refills | Status: DC
  Filled 2022-03-01: qty 30, 30d supply, fill #0
  Filled 2022-04-02: qty 30, 30d supply, fill #1
  Filled 2022-05-06: qty 30, 30d supply, fill #2
  Filled 2022-06-03 – 2022-06-04 (×2): qty 30, 30d supply, fill #3

## 2022-03-01 MED ORDER — CARVEDILOL 3.125 MG PO TABS
ORAL_TABLET | Freq: Two times a day (BID) | ORAL | 0 refills | Status: DC
  Filled 2022-03-01: qty 60, 30d supply, fill #0

## 2022-03-02 ENCOUNTER — Other Ambulatory Visit: Payer: Self-pay

## 2022-03-10 DIAGNOSIS — L400 Psoriasis vulgaris: Secondary | ICD-10-CM | POA: Diagnosis not present

## 2022-03-11 ENCOUNTER — Other Ambulatory Visit: Payer: Self-pay

## 2022-03-11 ENCOUNTER — Ambulatory Visit: Payer: Self-pay | Attending: Internal Medicine | Admitting: Internal Medicine

## 2022-03-11 DIAGNOSIS — E1169 Type 2 diabetes mellitus with other specified complication: Secondary | ICD-10-CM

## 2022-03-11 DIAGNOSIS — G4733 Obstructive sleep apnea (adult) (pediatric): Secondary | ICD-10-CM

## 2022-03-11 DIAGNOSIS — E782 Mixed hyperlipidemia: Secondary | ICD-10-CM

## 2022-03-11 DIAGNOSIS — Z1211 Encounter for screening for malignant neoplasm of colon: Secondary | ICD-10-CM

## 2022-03-11 DIAGNOSIS — I10 Essential (primary) hypertension: Secondary | ICD-10-CM

## 2022-03-11 DIAGNOSIS — Z9989 Dependence on other enabling machines and devices: Secondary | ICD-10-CM

## 2022-03-11 LAB — POCT GLYCOSYLATED HEMOGLOBIN (HGB A1C): HbA1c, POC (controlled diabetic range): 6.3 % (ref 0.0–7.0)

## 2022-03-11 MED ORDER — TRULICITY 0.75 MG/0.5ML ~~LOC~~ SOAJ
0.7500 mg | SUBCUTANEOUS | 5 refills | Status: DC
  Filled 2022-03-11: qty 2, 28d supply, fill #0
  Filled 2022-04-02: qty 2, 28d supply, fill #1
  Filled 2022-05-06: qty 2, 28d supply, fill #2
  Filled 2022-06-03 – 2022-06-04 (×2): qty 2, 28d supply, fill #3
  Filled 2022-10-24: qty 2, 28d supply, fill #4

## 2022-03-11 NOTE — Patient Instructions (Signed)
Stop the Glimepiride.  ?Start Trulicity as discussed. ?

## 2022-03-11 NOTE — Progress Notes (Signed)
? ? ?Patient ID: Janet [name], female    DOB: 03-22-1971  MRN: [id number] ? ?CC: Hypertension ? ? ?Subjective: ?Janet [name] is a 51 y.o. female who presents for chronic ds management ?Her concerns today include:  ?Pt with hx of DM,HTN, HL, GERD, obesity, OSA, on CPAP, vit B12 def, vit D def, obesity, anx/dep, gout, psoriasis, melanoma, COVID-19 infection 03/2020 ? ?DM/Obesity: taking Amaryl as prescribed ?Checks BS in a.m.  Range 122-136 ?Occasional BS low at work which she is able to feel and address appropriately. ?Has to walk 1/2 a day from parking at her work place.  Also walking at home 2-3 days /wk ?Feels she is doing okay with her eating habits and not eating as much. ? ?HTN: Reports compliance with Cozaar, carvedilol and hydralazine.  She limits salt in the foods.  No chest pains or shortness of breath.   ? ?HL:  taking and tolerating atorvastatin ? ?OSA: Reports compliance with using CPAP.  She feels she has benefited from using the CPAP.  No daytime sleepiness. ?Patient Active Problem List  ? Diagnosis Date Noted  ? Type 2 diabetes mellitus with morbid obesity ([place]) 09/14/2021  ? Major depressive disorder, recurrent episode, moderate ([place]) 07/03/2021  ? Peripheral polyneuropathy 05/14/2021  ? COVID-19 virus infection 03/27/2020  ? Glaucoma suspect of both eyes 09/17/2019  ? Personal history of malignant melanoma of skin 09/17/2019  ? Prediabetes 02/19/2019  ? OSA (obstructive sleep apnea) 01/28/2019  ? Seborrheic dermatitis of scalp 06/15/2018  ? Psoriasis 02/14/2018  ? Vitamin B 12 deficiency 02/14/2018  ? Vitamin D deficiency 02/14/2018  ? Anxiety and depression 02/03/2015  ? Hyperlipemia 02/03/2015  ? Obesity 02/03/2015  ? Anemia due to chronic blood loss - reports history of evaluation and told due to menorrhagia 05/30/2014  ? MELANOMA, TRUNK 01/24/2008  ? Essential hypertension 08/22/2007  ? GERD 08/22/2007  ? ABFND PAP SMEAR HGSIL 08/22/2007  ?  ? ?Current Outpatient Medications on File Prior  to Visit  ?Medication Sig Dispense Refill  ? albuterol (PROVENTIL) (2.5 MG/3ML) 0.083% nebulizer solution Take 6 mLs (5 mg total) by nebulization every 4 (four) hours as needed for wheezing. (Patient not taking: No sig reported) 75 mL 0  ? albuterol (VENTOLIN HFA) 108 (90 Base) MCG/ACT inhaler Inhale 2 puffs into the lungs every 4 (four) hours as needed for wheezing or shortness of breath. (Patient not taking: No sig reported) 18 g 0  ? allopurinol (ZYLOPRIM) 100 MG tablet TAKE 1 TABLET (100 MG TOTAL) BY MOUTH DAILY. 30 tablet 0  ? atorvastatin (LIPITOR) 20 MG tablet TAKE 1 TABLET (20 MG TOTAL) BY MOUTH DAILY. 90 tablet 2  ? Blood Glucose Monitoring Suppl (TRUE METRIX METER) w/Device KIT Use as directed 1 kit 0  ? carvedilol (COREG) 3.125 MG tablet TAKE 1 TABLET (3.125 MG TOTAL) BY MOUTH 2 (TWO) TIMES DAILY WITH A MEAL. 60 tablet 0  ? cholecalciferol (VITAMIN D3) 25 MCG (1000 UNIT) tablet Take 1,000 Units by mouth daily.    ? colchicine 0.6 MG tablet TAKE ONE TABLET TONIGHT. IF NO IMPROVEMENT IN PAIN AFTER ONE HOUR REPEAT DOSE. 30 tablet 4  ? escitalopram (LEXAPRO) 10 MG tablet Take 1 tablet (10 mg total) by mouth daily. 30 tablet 0  ? furosemide (LASIX) 20 MG tablet TAKE 1 TABLET BY MOUTH AS NEEDED FOR SWELLING IN LEGS 30 tablet 3  ? gabapentin (NEURONTIN) 300 MG capsule Take 1 capsule (300 mg total) by mouth at bedtime. 30 capsule 3  ?  glimepiride (AMARYL) 2 MG tablet Take 1 tablet (2 mg total) by mouth daily before breakfast. 30 tablet 2  ? glucose blood (TRUE METRIX BLOOD GLUCOSE TEST) test strip Use as instructed 100 each 12  ? hydrALAZINE (APRESOLINE) 10 MG tablet TAKE 1 TABLET (10 MG TOTAL) BY MOUTH 2 (TWO) TIMES DAILY. 60 tablet 0  ? losartan (COZAAR) 100 MG tablet Take 1 tablet (100 mg total) by mouth daily. 30 tablet 2  ? omeprazole (PRILOSEC) 20 MG capsule Take 1 capsule (20 mg total) by mouth daily as needed. (Patient not taking: No sig reported) 30 capsule 3  ? PARoxetine (PAXIL) 10 MG tablet Take 1  tablet (10 mg total) by mouth daily. (Patient not taking: No sig reported) 30 tablet 5  ? triamcinolone cream (KENALOG) 0.1 % Apply BID as needed to affected areas on extremities. (Patient not taking: No sig reported) 45 g 1  ? trimethoprim-polymyxin b (POLYTRIM) ophthalmic solution Place 1 drop into the right eye every 6 (six) hours. For 5-7 days 10 mL 0  ? ?Current Facility-Administered Medications on File Prior to Visit  ?Medication Dose Route Frequency Provider Last Rate Last Admin  ? cyanocobalamin ((VITAMIN B-12)) injection 1,000 mcg  1,000 mcg Intramuscular Q30 days Ladell Pier, MD   1,000 mcg at 09/28/21 1503  ? ? ?Allergies  ?Allergen Reactions  ? Lidocaine Hypertension  ?  ? Lidocaine allergy? Reports heart racing when has "shot of medicine in cevix" for leep  ? Hydralazine Other (See Comments)  ?  Edema in legs  ? Lisinopril-Hydrochlorothiazide Other (See Comments)  ?  Leg cramps.  ? Metformin And Related Diarrhea  ? Norvasc [Amlodipine Besylate] Other (See Comments)  ?  Leg pain  ? Penicillins Itching  ? ? ?Social History  ? ?Socioeconomic History  ? Marital status: Single  ?  Spouse name: Not on file  ? Number of children: 1  ? Years of education: Not on file  ? Highest education level: Associate degree: academic program  ?Occupational History  ? Not on file  ?Tobacco Use  ? Smoking status: Never  ? Smokeless tobacco: Never  ?Vaping Use  ? Vaping Use: Never used  ?Substance and Sexual Activity  ? Alcohol use: No  ? Drug use: No  ? Sexual activity: Not Currently  ?  Partners: Male  ?  Birth control/protection: None  ?Other Topics Concern  ? Not on file  ?Social History Narrative  ? Work or School: daycare in infant room  ?   ? Home Situation: lives alone  ?   ? Spiritual Beliefs: none  ?   ? Lifestyle: no regular exercise; diet is poor  ?   ?   ?   ? ?Social Determinants of Health  ? ?Financial Resource Strain: Not on file  ?Food Insecurity: No Food Insecurity  ? Worried About Charity fundraiser  in the Last Year: Never true  ? Ran Out of Food in the Last Year: Never true  ?Transportation Needs: No Transportation Needs  ? Lack of Transportation (Medical): No  ? Lack of Transportation (Non-Medical): No  ?Physical Activity: Not on file  ?Stress: Not on file  ?Social Connections: Not on file  ?Intimate Partner Violence: Not on file  ? ? ?Family History  ?Problem Relation Age of Onset  ? Diabetes Mother   ? Hypertension Mother   ? Fibroids Mother   ? Colon polyps Father   ? Heart disease Father   ? Hypertension Father   ?  Heart attack Father   ? Cancer Father   ?     liver cancer  ? Diabetes Brother   ? Hypertension Brother   ? Diabetes Brother   ? Breast cancer Neg Hx   ? Colon cancer Neg Hx   ? Esophageal cancer Neg Hx   ? Stomach cancer Neg Hx   ? Rectal cancer Neg Hx   ? ? ?Past Surgical History:  ?Procedure Laterality Date  ? LEEP  2009  ? CIN I negative margin  ? SKIN CANCER EXCISION  2010  ? chest area  ? ? ?ROS: ?Review of Systems ?Negative except as stated above ? ?PHYSICAL EXAM: ?BP 118/79   Pulse 83   Resp 16   Wt 297 lb 6.4 oz (134.9 kg)   SpO2 95%   BMI 46.58 kg/m?   ?Wt Readings from Last 3 Encounters:  ?03/11/22 297 lb 6.4 oz (134.9 kg)  ?10/07/21 290 lb (131.5 kg)  ?09/14/21 (!) 303 lb 12.8 oz (137.8 kg)  ? ? ?Physical Exam ? ?General appearance - alert, well appearing, obese middle-age Caucasian female and in no distress ?Chest - clear to auscultation, no wheezes, rales or rhonchi, symmetric air entry ?Heart - normal rate, regular rhythm, normal S1, S2, no murmurs, rubs, clicks or gallops ?Extremities - peripheral pulses normal, no pedal edema, no clubbing or cyanosis ? ? ? ?  Latest Ref Rng & Units 09/14/2021  ?  4:25 PM 10/09/2020  ?  4:57 PM 06/23/2020  ?  3:48 PM  ?CMP  ?Glucose 70 - 99 mg/dL 105   100     ?BUN 6 - 24 mg/dL 10   13     ?Creatinine 0.57 - 1.00 mg/dL 0.62   0.67     ?Sodium 134 - 144 mmol/L 141   141     ?Potassium 3.5 - 5.2 mmol/L 4.2   3.9     ?Chloride 96 - 106 mmol/L  101   103     ?CO2 20 - 29 mmol/L 24   25     ?Calcium 8.7 - 10.2 mg/dL 10.0   9.6     ?Total Protein 6.0 - 8.5 g/dL 7.0   6.9   7.0    ?Total Bilirubin 0.0 - 1.2 mg/dL 0.2   <0.2   0.3    ?Alkaline Phos 44 - 1

## 2022-03-16 ENCOUNTER — Encounter: Payer: Self-pay | Admitting: Internal Medicine

## 2022-03-16 ENCOUNTER — Ambulatory Visit: Payer: Self-pay | Admitting: *Deleted

## 2022-03-16 ENCOUNTER — Ambulatory Visit (HOSPITAL_COMMUNITY)
Admission: EM | Admit: 2022-03-16 | Discharge: 2022-03-16 | Disposition: A | Discharge status: Home / Self Care | Payer: 59 | Attending: Emergency Medicine | Admitting: Emergency Medicine

## 2022-03-16 ENCOUNTER — Other Ambulatory Visit: Payer: Self-pay

## 2022-03-16 DIAGNOSIS — J020 Streptococcal pharyngitis: Secondary | ICD-10-CM | POA: Diagnosis not present

## 2022-03-16 LAB — POCT RAPID STREP A, ED / UC: Streptococcus, Group A Screen (Direct): POSITIVE — AB

## 2022-03-16 MED ORDER — CEPHALEXIN 500 MG PO CAPS
500.0000 mg | ORAL_CAPSULE | Freq: Two times a day (BID) | ORAL | 0 refills | Status: DC
  Filled 2022-03-16: qty 20, 10d supply, fill #0

## 2022-03-16 NOTE — ED Provider Notes (Signed)
?[place] ? ? ? ?CSN: [id number] ?Arrival date & time: 03/16/22  1012 ? ? ?  ? ?History   ?Chief Complaint ?Chief Complaint  ?Patient presents with  ? Sore Throat  ? ? ?HPI ?Janet [name] is a 51 y.o. female.  ? ?Patient presents with sore throat for 2 days.  Painful to swallow.  Decreased appetite, poor fluid and food intake but tolerating fluids.  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  Denies fever, chills, body aches, nasal congestion, ear pain, headaches, cough, shortness of breath, wheezing, difficulty swallowing.  Has attempted use of ibuprofen.  ? ?Past Medical History:  ?Diagnosis Date  ? ABFND PAP SMEAR HGSIL 08/22/2007  ? Anemia due to chronic blood loss - reports history of evaluation and told due to menorrhagia 05/30/2014  ? ANXIETY 08/22/2007  ? Anxiety and depression   ? Anxiety and depression   ? Diabetes 1.5, managed as type 2 ([place])   ? Frequent headaches   ? GERD 08/22/2007  ? Hyperglycemia   ? Hyperlipemia   ? HYPERTENSION, BENIGN ESSENTIAL 08/22/2007  ? MELANOMA, TRUNK 01/24/2008  ? Obesity 02/03/2015  ? PLANTAR FASCIITIS, RIGHT 07/01/2008  ? PSORIASIS 08/22/2007  ? Sleep apnea   ? wears CPAP  ? ? ?Patient Active Problem List  ? Diagnosis Date Noted  ? Type 2 diabetes mellitus with morbid obesity ([place]) 09/14/2021  ? Major depressive disorder, recurrent episode, moderate ([place]) 07/03/2021  ? Peripheral polyneuropathy 05/14/2021  ? COVID-19 virus infection 03/27/2020  ? Glaucoma suspect of both eyes 09/17/2019  ? Personal history of malignant melanoma of skin 09/17/2019  ? Prediabetes 02/19/2019  ? OSA (obstructive sleep apnea) 01/28/2019  ? Seborrheic dermatitis of scalp 06/15/2018  ? Psoriasis 02/14/2018  ? Vitamin B 12 deficiency 02/14/2018  ? Vitamin D deficiency 02/14/2018  ? Anxiety and depression 02/03/2015  ? Hyperlipemia 02/03/2015  ? Obesity 02/03/2015  ? Anemia due to chronic blood loss - reports history of evaluation and told due to menorrhagia  05/30/2014  ? MELANOMA, TRUNK 01/24/2008  ? Essential hypertension 08/22/2007  ? GERD 08/22/2007  ? ABFND PAP SMEAR HGSIL 08/22/2007  ? ? ?Past Surgical History:  ?Procedure Laterality Date  ? LEEP  2009  ? CIN I negative margin  ? SKIN CANCER EXCISION  2010  ? chest area  ? ? ?OB History   ? ? Gravida  ?1  ? Para  ?1  ? Term  ?1  ? Preterm  ?   ? AB  ?   ? Living  ?1  ?  ? ? SAB  ?   ? IAB  ?   ? Ectopic  ?   ? Multiple  ?   ? Live Births  ?1  ?   ?  ?  ? ? ? ?Home Medications   ? ?Prior to Admission medications   ?Medication Sig Start Date End Date Taking? Authorizing Provider  ?albuterol (PROVENTIL) (2.5 MG/3ML) 0.083% nebulizer solution Take 6 mLs (5 mg total) by nebulization every 4 (four) hours as needed for wheezing. ?Patient not taking: No sig reported 12/27/16   Carlisle Cater, PA-C  ?albuterol (VENTOLIN HFA) 108 (90 Base) MCG/ACT inhaler Inhale 2 puffs into the lungs every 4 (four) hours as needed for wheezing or shortness of breath. ?Patient not taking: No sig reported 03/25/20   Suella Broad A, PA-C  ?allopurinol (ZYLOPRIM) 100 MG tablet TAKE 1 TABLET (100 MG TOTAL) BY MOUTH DAILY. 03/01/22 03/01/23  Wynetta Emery,  Dalbert Batman, MD  ?atorvastatin (LIPITOR) 20 MG tablet TAKE 1 TABLET (20 MG TOTAL) BY MOUTH DAILY. 10/07/21 10/07/22  Ladell Pier, MD  ?Blood Glucose Monitoring Suppl (TRUE METRIX METER) w/Device KIT Use as directed 06/25/20   Elsie Stain, MD  ?carvedilol (COREG) 3.125 MG tablet TAKE 1 TABLET (3.125 MG TOTAL) BY MOUTH 2 (TWO) TIMES DAILY WITH A MEAL. 03/01/22 03/01/23  Ladell Pier, MD  ?cholecalciferol (VITAMIN D3) 25 MCG (1000 UNIT) tablet Take 1,000 Units by mouth daily.    [provider]  ?colchicine 0.6 MG tablet TAKE ONE TABLET TONIGHT. IF NO IMPROVEMENT IN PAIN AFTER ONE HOUR REPEAT DOSE. 06/22/21 06/22/22  Ladell Pier, MD  ?Dulaglutide (TRULICITY) 1.69 IH/0.3UU SOPN Inject 0.75 mg into the skin once a week. 03/11/22   Ladell Pier, MD  ?escitalopram (LEXAPRO) 10 MG  tablet Take 1 tablet (10 mg total) by mouth daily. 03/01/22   Ladell Pier, MD  ?furosemide (LASIX) 20 MG tablet TAKE 1 TABLET BY MOUTH AS NEEDED FOR SWELLING IN LEGS 10/07/21 10/07/22  Ladell Pier, MD  ?gabapentin (NEURONTIN) 300 MG capsule Take 1 capsule (300 mg total) by mouth at bedtime. 03/01/22   Ladell Pier, MD  ?glucose blood (TRUE METRIX BLOOD GLUCOSE TEST) test strip Use as instructed 10/06/20   Elsie Stain, MD  ?hydrALAZINE (APRESOLINE) 10 MG tablet TAKE 1 TABLET (10 MG TOTAL) BY MOUTH 2 (TWO) TIMES DAILY. 03/01/22 03/01/23  Ladell Pier, MD  ?losartan (COZAAR) 100 MG tablet Take 1 tablet (100 mg total) by mouth daily. 01/26/22 04/26/22  Ladell Pier, MD  ?omeprazole (PRILOSEC) 20 MG capsule Take 1 capsule (20 mg total) by mouth daily as needed. ?Patient not taking: No sig reported 05/22/19   Ladell Pier, MD  ?PARoxetine (PAXIL) 10 MG tablet Take 1 tablet (10 mg total) by mouth daily. ?Patient not taking: No sig reported 05/22/19   Ladell Pier, MD  ?triamcinolone cream (KENALOG) 0.1 % Apply BID as needed to affected areas on extremities. ?Patient not taking: No sig reported 02/14/18   Ladell Pier, MD  ?trimethoprim-polymyxin b (POLYTRIM) ophthalmic solution Place 1 drop into the right eye every 6 (six) hours. For 5-7 days 01/01/22   Ladell Pier, MD  ? ? ?Family History ?Family History  ?Problem Relation Age of Onset  ? Diabetes Mother   ? Hypertension Mother   ? Fibroids Mother   ? Colon polyps Father   ? Heart disease Father   ? Hypertension Father   ? Heart attack Father   ? Cancer Father   ?     liver cancer  ? Diabetes Brother   ? Hypertension Brother   ? Diabetes Brother   ? Breast cancer Neg Hx   ? Colon cancer Neg Hx   ? Esophageal cancer Neg Hx   ? Stomach cancer Neg Hx   ? Rectal cancer Neg Hx   ? ? ?Social History ?Social History  ? ?Tobacco Use  ? Smoking status: Never  ? Smokeless tobacco: Never  ?Vaping Use  ? Vaping Use: Never used   ?Substance Use Topics  ? Alcohol use: No  ? Drug use: No  ? ? ? ?Allergies   ?Lidocaine, Hydralazine, Lisinopril-hydrochlorothiazide, Metformin and related, Norvasc [amlodipine besylate], and Penicillins ? ? ?Review of Systems ?Review of Systems  ?Constitutional: Negative.   ?HENT:  Positive for sore throat. Negative for congestion, dental problem, drooling, ear discharge, ear pain, facial swelling,  hearing loss, mouth sores, nosebleeds, postnasal drip, rhinorrhea, sinus pressure, sinus pain, sneezing, tinnitus, trouble swallowing and voice change.   ?Respiratory:  Negative for apnea, cough, choking, chest tightness, shortness of breath, wheezing and stridor.   ?Cardiovascular: Negative.   ?Gastrointestinal: Negative.   ?Skin: Negative.   ? ? ?Physical Exam ?Triage Vital Signs ?ED Triage Vitals  ?Enc Vitals Group  ?   BP 03/16/22 1123 135/82  ?   Pulse Rate 03/16/22 1123 86  ?   Resp --   ?   Temp 03/16/22 1123 97.7 ?F (36.5 ?C)  ?   Temp src --   ?   SpO2 03/16/22 1123 95 %  ?   Weight --   ?   Height --   ?   Head Circumference --   ?   Peak Flow --   ?   Pain Score 03/16/22 1121 8  ?   Pain Loc --   ?   Pain Edu? --   ?   Excl. in [place]? --   ? ?No data found. ? ?Updated Vital Signs ?BP 135/82   Pulse 86   Temp 97.7 ?F (36.5 ?C)   SpO2 95%  ? ?Visual Acuity ?Right Eye Distance:   ?Left Eye Distance:   ?Bilateral Distance:   ? ?Right Eye Near:   ?Left Eye Near:    ?Bilateral Near:    ? ?Physical Exam ?Constitutional:   ?   Appearance: She is well-developed.  ?HENT:  ?   Head: Normocephalic.  ?   Right Ear: Tympanic membrane and ear canal normal.  ?   Left Ear: Tympanic membrane and ear canal normal.  ?   Nose: No congestion or rhinorrhea.  ?   Mouth/Throat:  ?   Mouth: Mucous membranes are moist.  ?   Pharynx: Posterior oropharyngeal erythema present.  ?   Tonsils: No tonsillar exudate. 0 on the right. 0 on the left.  ?Cardiovascular:  ?   Rate and Rhythm: Normal rate and regular rhythm.  ?   Heart sounds: Normal  heart sounds.  ?Pulmonary:  ?   Effort: Pulmonary effort is normal.  ?   Breath sounds: Normal breath sounds.  ?Musculoskeletal:  ?   Cervical back: Normal range of motion and neck supple.  ?Skin: ?   General: Skin

## 2022-03-16 NOTE — Telephone Encounter (Signed)
Pt called in c/o a sore throat.   She sent a MyCH[facility] message and Dr. Wynetta Emery responded and told her to call in and schedule an urgent care appt.   No appts available.  I went a message to Selina Cooley at [facility] and looked to see if there were any appts with Carrolyn Meiers, PA at [facility] since the mobile unit is down but there were no appts today.    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    I referred her to the urgent care.   I also offered her a [place] [facility] visit option but she preferred the urgent care because she needs to be tested for strep. ? ? 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 ?Symptoms: sore throat ?Frequency: 2 days now ?Pertinent Negatives: Patient denies fever or nasal congestion ?Disposition: '[]'$ ED /'[x]'$ Urgent Care (no appt availability in office) / '[]'$ Appointment(In office/virtual)/ '[]'$  [place] [facility]/ '[]'$ Home Care/ '[]'$ Refused Recommended Disposition /'[]'$ [facility]/ '[]'$  Follow-up with PCP ?Additional Notes: See note above  ?

## 2022-03-16 NOTE — ED Triage Notes (Signed)
Pt is present today with c/o sore throat. Pt sx started yesterday  ?

## 2022-03-16 NOTE — Discharge Instructions (Signed)
Your strep test is positive ? ?Take Keflex twice daily for the next 10 days, you should begin to see improvement in symptoms in about 48 hours and then steadily improvement from the ear ? ?If you have any reaction to Keflex, stop medication, take a dose of Benadryl and notify the urgent care, if you have any signs of difficulty breathing you will need to be seen in person ? ?You may attempt use of Tylenol or ibuprofen to help with discomfort ? ?May attempt use of salt water gargles, throat lozenges, warm liquids, teaspoons of honey for additional support ?

## 2022-03-16 NOTE — Telephone Encounter (Signed)
Reason for Disposition ? [1] Exposure to family member (or spouse or boyfriend/girlfriend) with test-proven strep AND [2] within last 10 days ? ?Answer Assessment - Initial Assessment Questions ?1. ONSET: "When did the throat start hurting?" (Hours or days ago)  ?    Sore throat for 2 days.   No fever.   No nasal congestion.    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   I don't see any pus in back of throat.   ?2. SEVERITY: "How bad is the sore throat?" (Scale 1-10; mild, moderate or severe) ?  - MILD (1-3):  doesn't interfere with eating or normal activities ?  - MODERATE (4-7): interferes with eating some solids and normal activities ?  - SEVERE (8-10):  excruciating pain, interferes with most normal activities ?  - SEVERE DYSPHAGIA: can't swallow liquids, drooling ?    *No Answer* ?3. STREP EXPOSURE: "Has there been any exposure to strep within the past week?" If Yes, ask: "What type of contact occurred?"  ?    Yes ?4.  VIRAL SYMPTOMS: "Are there any symptoms of a cold, such as a runny nose, cough, hoarse voice or red eyes?"  ?    *No Answer* ?5. FEVER: "Do you have a fever?" If Yes, ask: "What is your temperature, how was it measured, and when did it start?" ?    *No Answer* ?6. PUS ON THE TONSILS: "Is there pus on the tonsils in the back of your throat?" ?    *No Answer* ?7. OTHER SYMPTOMS: "Do you have any other symptoms?" (e.g., difficulty breathing, headache, rash) ?    *No Answer* ?8. PREGNANCY: "Is there any chance you are pregnant?" "When was your last menstrual period?" ?    *No Answer* ? ?Protocols used: Sore Throat-A-AH ? ?

## 2022-04-02 ENCOUNTER — Other Ambulatory Visit: Payer: Self-pay | Admitting: Internal Medicine

## 2022-04-02 DIAGNOSIS — I10 Essential (primary) hypertension: Secondary | ICD-10-CM

## 2022-04-02 DIAGNOSIS — F331 Major depressive disorder, recurrent, moderate: Secondary | ICD-10-CM

## 2022-04-02 DIAGNOSIS — Z8739 Personal history of other diseases of the musculoskeletal system and connective tissue: Secondary | ICD-10-CM

## 2022-04-05 ENCOUNTER — Other Ambulatory Visit: Payer: Self-pay

## 2022-04-05 MED ORDER — ALLOPURINOL 100 MG PO TABS
ORAL_TABLET | Freq: Every day | ORAL | 2 refills | Status: DC
  Filled 2022-04-05: qty 30, 30d supply, fill #0
  Filled 2022-05-06: qty 30, 30d supply, fill #1
  Filled 2022-06-03 – 2022-06-14 (×4): qty 30, 30d supply, fill #2

## 2022-04-05 MED ORDER — CARVEDILOL 3.125 MG PO TABS
ORAL_TABLET | Freq: Two times a day (BID) | ORAL | 2 refills | Status: DC
  Filled 2022-04-05: qty 60, 30d supply, fill #0
  Filled 2022-05-06: qty 60, 30d supply, fill #1
  Filled 2022-06-03 – 2022-06-14 (×4): qty 60, 30d supply, fill #2

## 2022-04-05 MED ORDER — ESCITALOPRAM OXALATE 10 MG PO TABS
10.0000 mg | ORAL_TABLET | Freq: Every day | ORAL | 2 refills | Status: DC
  Filled 2022-04-05: qty 30, 30d supply, fill #0
  Filled 2022-05-06: qty 30, 30d supply, fill #1
  Filled 2022-06-03 – 2022-06-14 (×4): qty 30, 30d supply, fill #2

## 2022-04-05 MED ORDER — HYDRALAZINE HCL 10 MG PO TABS
ORAL_TABLET | Freq: Two times a day (BID) | ORAL | 2 refills | Status: DC
  Filled 2022-04-05: qty 60, 30d supply, fill #0
  Filled 2022-05-06: qty 60, 30d supply, fill #1
  Filled 2022-06-03 – 2022-06-14 (×4): qty 60, 30d supply, fill #2

## 2022-04-05 NOTE — Telephone Encounter (Signed)
Requested Prescriptions  ?Pending Prescriptions Disp Refills  ?? allopurinol (ZYLOPRIM) 100 MG tablet 30 tablet 2  ?  Sig: TAKE 1 TABLET (100 MG TOTAL) BY MOUTH DAILY.  ?  ? Endocrinology:  Gout Agents - allopurinol Failed - 04/02/2022  7:48 PM  ?  ?  Failed - Uric Acid in normal range and within 360 days  ?  Uric Acid  ?Date Value Ref Range Status  ?05/26/2020 8.3 (H) 2.6 - 6.2 mg/dL Final  ?  Comment:  ?             Therapeutic target for gout patients: <6.0  ?   ?  ?  Failed - CBC within normal limits and completed in the last 12 months  ?  WBC  ?Date Value Ref Range Status  ?09/14/2021 8.2 3.4 - 10.8 x10E3/uL Final  ?03/25/2020 6.1 4.0 - 10.5 K/uL Final  ? ?RBC  ?Date Value Ref Range Status  ?09/14/2021 4.03 3.77 - 5.28 x10E6/uL Final  ?03/25/2020 4.09 3.87 - 5.11 MIL/uL Final  ? ?Hemoglobin  ?Date Value Ref Range Status  ?09/14/2021 12.9 11.1 - 15.9 g/dL Final  ? ?Hematocrit  ?Date Value Ref Range Status  ?09/14/2021 38.4 34.0 - 46.6 % Final  ? ?MCHC  ?Date Value Ref Range Status  ?09/14/2021 33.6 31.5 - 35.7 g/dL Final  ?03/25/2020 31.8 30.0 - 36.0 g/dL Final  ? ?MCH  ?Date Value Ref Range Status  ?09/14/2021 32.0 26.6 - 33.0 pg Final  ?03/25/2020 30.8 26.0 - 34.0 pg Final  ? ?MCV  ?Date Value Ref Range Status  ?09/14/2021 95 79 - 97 fL Final  ? ?No results found for: PLTCOUNTKUC, LABPLAT, [place] ?RDW  ?Date Value Ref Range Status  ?09/14/2021 13.3 11.7 - 15.4 % Final  ? ?  ?  ?  Passed - Cr in normal range and within 360 days  ?  Creatinine, Ser  ?Date Value Ref Range Status  ?09/14/2021 0.62 0.57 - 1.00 mg/dL Final  ?   ?  ?  Passed - Valid encounter within last 12 months  ?  Recent Outpatient Visits   ?      ? 3 weeks ago Type 2 diabetes mellitus with morbid obesity ([place])  ? [place] Ladell Pier, MD  ? 3 months ago Pink eye disease of right eye  ? [place] Karle Plumber B, MD  ? 6 months ago Type 2 diabetes mellitus with morbid  obesity ([place])  ? [place] Ladell Pier, MD  ? 9 months ago Major depressive disorder, recurrent episode, moderate ([place])  ? [place] Ladell Pier, MD  ? 10 months ago Prediabetes  ? [place] Ladell Pier, MD  ?  ?  ?Future Appointments   ?        ? In 3 months Ladell Pier, MD [place]  ?  ? ?  ?  ?  ?? carvedilol (COREG) 3.125 MG tablet 60 tablet 2  ?  Sig: TAKE 1 TABLET (3.125 MG TOTAL) BY MOUTH 2 (TWO) TIMES DAILY WITH A MEAL.  ?  ? Cardiovascular: Beta Blockers 3 Passed - 04/02/2022  7:48 PM  ?  ?  Passed - Cr in normal range and within 360 days  ?  Creatinine, Ser  ?Date Value Ref Range Status  ?09/14/2021 0.62 0.57 - 1.00  mg/dL Final  ?   ?  ?  Passed - AST in normal range and within 360 days  ?  AST  ?Date Value Ref Range Status  ?09/14/2021 21 0 - 40 IU/L Final  ?   ?  ?  Passed - ALT in normal range and within 360 days  ?  ALT  ?Date Value Ref Range Status  ?09/14/2021 24 0 - 32 IU/L Final  ?   ?  ?  Passed - Last BP in normal range  ?  BP Readings from Last 1 Encounters:  ?03/16/22 135/82  ?   ?  ?  Passed - Last Heart Rate in normal range  ?  Pulse Readings from Last 1 Encounters:  ?03/16/22 86  ?   ?  ?  Passed - Valid encounter within last 6 months  ?  Recent Outpatient Visits   ?      ? 3 weeks ago Type 2 diabetes mellitus with morbid obesity ([place])  ? [place] Ladell Pier, MD  ? 3 months ago Pink eye disease of right eye  ? [place] Karle Plumber B, MD  ? 6 months ago Type 2 diabetes mellitus with morbid obesity ([place])  ? [place] Ladell Pier, MD  ? 9 months ago Major depressive disorder, recurrent episode, moderate ([place])  ? [place] Ladell Pier, MD  ? 10 months ago Prediabetes  ? [place] Ladell Pier, MD  ?  ?  ?Future Appointments   ?        ? In 3 months Ladell Pier, MD [place]  ?  ? ?  ?  ?  ?? hydrALAZINE (APRESOLINE) 10 MG tablet 60 tablet 2  ?  Sig: TAKE 1 TABLET (10 MG TOTAL) BY MOUTH 2 (TWO) TIMES DAILY.  ?  ? Cardiovascular:  Vasodilators Failed - 04/02/2022  7:48 PM  ?  ?  Failed - ANA Screen, Ifa, Serum in normal range and within 360 days  ?  No results found for: ANA, ANATITER, LABANTI   ?  ?  Passed - HCT in normal range and within 360 days  ?  Hematocrit  ?Date Value Ref Range Status  ?09/14/2021 38.4 34.0 - 46.6 % Final  ?   ?  ?  Passed - HGB in normal range and within 360 days  ?  Hemoglobin  ?Date Value Ref Range Status  ?09/14/2021 12.9 11.1 - 15.9 g/dL Final  ?   ?  ?  Passed - RBC in normal range and within 360 days  ?  RBC  ?Date Value Ref Range Status  ?09/14/2021 4.03 3.77 - 5.28 x10E6/uL Final  ?03/25/2020 4.09 3.87 - 5.11 MIL/uL Final  ?   ?  ?  Passed - WBC in normal range and within 360 days  ?  WBC  ?Date Value Ref Range Status  ?09/14/2021 8.2 3.4 - 10.8 x10E3/uL Final  ?03/25/2020 6.1 4.0 - 10.5 K/uL Final  ?   ?  ?  Passed - PLT in normal range and within 360 days  ?  Platelets  ?Date Value Ref Range Status  ?09/14/2021 378 150 - 450 x10E3/uL Final  ?   ?  ?  Passed - Last BP in normal range  ?  BP Readings from Last 1 Encounters:  ?  03/16/22 135/82  ?   ?  ?  Passed - Valid encounter within last 12 months  ?  Recent Outpatient Visits   ?      ? 3 weeks ago Type 2 diabetes mellitus with morbid obesity ([place])  ? [place] Ladell Pier, MD  ? 3 months ago Pink eye disease of right eye  ? [place] Karle Plumber B, MD  ? 6 months ago Type 2 diabetes mellitus with morbid obesity ([place])  ? [place] Ladell Pier, MD  ? 9 months ago Major depressive disorder, recurrent episode, moderate ([place])   ? [place] Ladell Pier, MD  ? 10 months ago Prediabetes  ? [place] Ladell Pier, MD  ?  ?  ?Future Appointments   ?        ? In 3 months Ladell Pier, MD [place]  ?  ? ?  ?  ?  ?? escitalopram (LEXAPRO) 10 MG tablet 30 tablet 2  ?  Sig: Take 1 tablet (10 mg total) by mouth daily.  ?  ? Psychiatry:  Antidepressants - SSRI Passed - 04/02/2022  7:48 PM  ?  ?  Passed - Completed PHQ-2 or PHQ-9 in the last 360 days  ?  ?  Passed - Valid encounter within last 6 months  ?  Recent Outpatient Visits   ?      ? 3 weeks ago Type 2 diabetes mellitus with morbid obesity ([place])  ? [place] Ladell Pier, MD  ? 3 months ago Pink eye disease of right eye  ? [place] Karle Plumber B, MD  ? 6 months ago Type 2 diabetes mellitus with morbid obesity ([place])  ? [place] Ladell Pier, MD  ? 9 months ago Major depressive disorder, recurrent episode, moderate ([employer])  ? [place] Ladell Pier, MD  ? 10 months ago Prediabetes  ? [place] Ladell Pier, MD  ?  ?  ?Future Appointments   ?        ? In 3 months Ladell Pier, MD [place]  ?  ? ?  ?  ?  ? ?

## 2022-05-06 ENCOUNTER — Other Ambulatory Visit: Payer: Self-pay | Admitting: Internal Medicine

## 2022-05-06 DIAGNOSIS — I10 Essential (primary) hypertension: Secondary | ICD-10-CM

## 2022-05-07 ENCOUNTER — Other Ambulatory Visit: Payer: Self-pay

## 2022-05-07 MED ORDER — LOSARTAN POTASSIUM 100 MG PO TABS
100.0000 mg | ORAL_TABLET | Freq: Every day | ORAL | 2 refills | Status: DC
  Filled 2022-05-07: qty 30, 30d supply, fill #0
  Filled 2022-06-03 – 2022-06-14 (×4): qty 30, 30d supply, fill #1

## 2022-05-07 NOTE — Telephone Encounter (Signed)
Requested medication (s) are due for refill today: expired medication  Requested medication (s) are on the active medication list: yes   Last refill:  01/26/22- 05/05/22 #30 2 refills   Future visit scheduled: yes in 2 months   Notes to clinic:  expired medication. Last labs 09/14/21. Do you want to renew rx?     Requested Prescriptions  Pending Prescriptions Disp Refills   losartan (COZAAR) 100 MG tablet 30 tablet 2    Sig: Take 1 tablet (100 mg total) by mouth daily.     Cardiovascular:  Angiotensin Receptor Blockers Failed - 05/06/2022 10:43 PM      Failed - Cr in normal range and within 180 days    Creatinine, Ser  Date Value Ref Range Status  09/14/2021 0.62 0.57 - 1.00 mg/dL Final         Failed - K in normal range and within 180 days    Potassium  Date Value Ref Range Status  09/14/2021 4.2 3.5 - 5.2 mmol/L Final         Passed - Patient is not pregnant      Passed - Last BP in normal range    BP Readings from Last 1 Encounters:  03/16/22 135/82         Passed - Valid encounter within last 6 months    Recent Outpatient Visits           1 month ago Type 2 diabetes mellitus with morbid obesity ([place])   [place] Ladell Pier, MD   4 months ago Pink eye disease of right eye   [place], MD   7 months ago Type 2 diabetes mellitus with morbid obesity [facility])   [place], MD   10 months ago Major depressive disorder, recurrent episode, moderate ([place])   [place], MD   11 months ago Prediabetes   [place], MD       Future Appointments             In 2 months Wynetta Emery Dalbert Batman, MD [place]

## 2022-05-12 ENCOUNTER — Other Ambulatory Visit: Payer: Self-pay

## 2022-05-12 ENCOUNTER — Encounter: Payer: Self-pay | Admitting: Podiatry

## 2022-05-12 ENCOUNTER — Ambulatory Visit (INDEPENDENT_AMBULATORY_CARE_PROVIDER_SITE_OTHER): Payer: 59 | Admitting: Podiatry

## 2022-05-12 ENCOUNTER — Ambulatory Visit (INDEPENDENT_AMBULATORY_CARE_PROVIDER_SITE_OTHER): Payer: 59

## 2022-05-12 DIAGNOSIS — M25571 Pain in right ankle and joints of right foot: Secondary | ICD-10-CM

## 2022-05-12 DIAGNOSIS — M76821 Posterior tibial tendinitis, right leg: Secondary | ICD-10-CM

## 2022-05-12 MED ORDER — MELOXICAM 15 MG PO TABS
15.0000 mg | ORAL_TABLET | Freq: Every day | ORAL | 0 refills | Status: DC
  Filled 2022-05-12: qty 30, 30d supply, fill #0

## 2022-05-12 NOTE — Patient Instructions (Signed)
Posterior Tibial Tendinitis Rehab Ask your health care provider which exercises are safe for you. Do exercises exactly as told by your health care provider and adjust them as directed. It is normal to feel mild stretching, pulling, tightness, or discomfort as you do these exercises. Stop right away if you feel sudden pain or your pain gets worse. Do not begin these exercises until told by your health care provider. Stretching and range-of-motion exercises These exercises warm up your muscles and joints and improve the movement and flexibility in your ankle and foot. These exercises may also help to relieve pain. Standing wall calf stretch, knee straight  Stand with your hands against a wall. Extend your left / right leg behind you, and bend your front knee slightly. If directed, place a folded washcloth under the arch of your foot for support. Point the toes of your back foot slightly inward. Keeping your heels on the floor and your back knee straight, shift your weight toward the wall. Do not allow your back to arch. You should feel a gentle stretch in your upper left / right calf. Hold this position for __________ seconds. Repeat __________ times. Complete this exercise __________ times a day. Standing wall calf stretch, knee bent Stand with your hands against a wall. Extend your left / right leg behind you, and bend your front knee slightly. If directed, place a folded washcloth under the arch of your foot for support. Point the toes of your back foot slightly inward. Unlock your back knee so it is bent. Keep your heels on the floor. You should feel a gentle stretch deep in your lower left / right calf. Hold this position for __________ seconds. Repeat __________ times. Complete this exercise __________ times a day. Strengthening exercises These exercises build strength and endurance in your ankle and foot. Endurance is the ability to use your muscles for a long time, even after they get  tired. Ankle inversion with band Secure one end of a rubber exercise band or tubing to a fixed object, such as a table leg or a pole, that will stay still when the band is pulled. Loop the other end of the band around the middle of your left / right foot. Sit on the floor facing the object with your left / right leg extended. The band or tube should be slightly tense when your foot is relaxed. Leading with your big toe, slowly bring your left / right foot and ankle inward, toward your other foot (inversion). Hold this position for __________ seconds. Slowly return your foot to the starting position. Repeat __________ times. Complete this exercise __________ times a day. Towel curls  Sit in a chair on a non-carpeted surface, and put your feet on the floor. Place a towel in front of your feet. If told by your health care provider, add a __________ weight to the end of the towel. Keeping your heel on the floor, put your left / right foot on the towel. Pull the towel toward you by grabbing the towel with your toes and curling them under. Keep your heel on the floor while you do this. Let your toes relax. Grab the towel with your toes again. Keep going until the towel is completely underneath your foot. Repeat __________ times. Complete this exercise __________ times a day. Balance exercise This exercise improves or maintains your balance. Balance is important in preventing falls. Single leg stand Without wearing shoes, stand near a railing or in a doorway. You may hold   on to the railing or door frame as needed for balance. Stand on your left / right foot. Keep your big toe down on the floor and try to keep your arch lifted. If balancing in this position is too easy, try the exercise with your eyes closed or while standing on a pillow. Hold this position for __________ seconds. Repeat __________ times. Complete this exercise __________ times a day. This information is not intended to replace  advice given to you by your health care provider. Make sure you discuss any questions you have with your health care provider. Document Revised: 03/20/2019 Document Reviewed: 01/24/2019 Elsevier Patient Education  2023 [employer]  

## 2022-05-12 NOTE — Progress Notes (Signed)
  Subjective:  Patient ID: Janet [name], female    DOB: Nov 19, 1971,   MRN: [id number]  Chief Complaint  Patient presents with   Foot Pain    Pt came in today for right foot and ankle pain on the medial side of the foot and ankle, that start 2 weeks ago, X-Rays done today, A1c- 6.3    51 y.o. female presents for right foot and ankle pain that started 2 weeks ago. Relates aching and hurts mostly after sitting. Has tried icing and pain medications with little relief. She has been taking ibuprofen.  . Denies any other pedal complaints. Denies n/v/f/c.   Past Medical History:  Diagnosis Date   ABFND PAP SMEAR HGSIL 08/22/2007   Anemia due to chronic blood loss - reports history of evaluation and told due to menorrhagia 05/30/2014   ANXIETY 08/22/2007   Anxiety and depression    Anxiety and depression    Diabetes 1.5, managed as type 2 ([place])    Frequent headaches    GERD 08/22/2007   Hyperglycemia    Hyperlipemia    HYPERTENSION, BENIGN ESSENTIAL 08/22/2007   MELANOMA, TRUNK 01/24/2008   Obesity 02/03/2015   PLANTAR FASCIITIS, RIGHT 07/01/2008   PSORIASIS 08/22/2007   Sleep apnea    wears CPAP    Objective:  Physical Exam: Vascular: DP/PT pulses 2/4 bilateral. CFT <3 seconds. Normal hair growth on digits. No edema.  Skin. No lacerations or abrasions bilateral feet.  Musculoskeletal: MMT 5/5 bilateral lower extremities in DF, PF, Inversion and Eversion. Deceased ROM in DF of ankle joint. Tender around insertion to Posterior tibial tendon. Some pain with inversion. But no pain with DF/PF.  Neurological: Sensation intact to light touch.   Assessment:   1. Posterior tibial tendon dysfunction (PTTD) of right lower extremity      Plan:  Patient was evaluated and treated and all questions answered. X-rays reviewed and discussed with patient. No acute fractures or dislocation. Spurring noted to posterior calcaneus and pes planus noted.  Discussed PTTD diagnosis and treatment  options with patient. Stretching exercises discussed and handout dispensed. Prescription for meloxicam provided. Tri-Lock ankle brace dispensed. Discussed if there is no improvement PT/MRI/injection may be an option. Patient to return to clinic in 6 to 8 weeks or sooner if symptoms fail to improve or worsen.   Lorenda Peck, DPM

## 2022-05-13 ENCOUNTER — Other Ambulatory Visit: Payer: Self-pay

## 2022-05-27 ENCOUNTER — Encounter: Payer: Self-pay | Admitting: Podiatry

## 2022-06-02 DIAGNOSIS — Z01419 Encounter for gynecological examination (general) (routine) without abnormal findings: Secondary | ICD-10-CM | POA: Diagnosis not present

## 2022-06-02 DIAGNOSIS — Z6841 Body Mass Index (BMI) 40.0 and over, adult: Secondary | ICD-10-CM | POA: Diagnosis not present

## 2022-06-03 ENCOUNTER — Other Ambulatory Visit: Payer: Self-pay | Admitting: Podiatry

## 2022-06-03 DIAGNOSIS — M76821 Posterior tibial tendinitis, right leg: Secondary | ICD-10-CM

## 2022-06-03 DIAGNOSIS — Z124 Encounter for screening for malignant neoplasm of cervix: Secondary | ICD-10-CM | POA: Diagnosis not present

## 2022-06-04 ENCOUNTER — Other Ambulatory Visit: Payer: Self-pay

## 2022-06-07 ENCOUNTER — Other Ambulatory Visit: Payer: Self-pay

## 2022-06-11 ENCOUNTER — Other Ambulatory Visit: Payer: Self-pay

## 2022-06-14 ENCOUNTER — Encounter: Payer: Self-pay | Admitting: Internal Medicine

## 2022-06-17 ENCOUNTER — Other Ambulatory Visit: Payer: Self-pay

## 2022-06-18 ENCOUNTER — Other Ambulatory Visit: Payer: Self-pay

## 2022-06-21 ENCOUNTER — Other Ambulatory Visit: Payer: Self-pay

## 2022-06-23 ENCOUNTER — Ambulatory Visit: Payer: 59 | Admitting: Podiatry

## 2022-06-30 ENCOUNTER — Ambulatory Visit: Payer: 59 | Admitting: Podiatry

## 2022-07-12 ENCOUNTER — Ambulatory Visit: Payer: No Typology Code available for payment source | Admitting: Internal Medicine

## 2022-07-19 ENCOUNTER — Other Ambulatory Visit: Payer: Self-pay | Admitting: Internal Medicine

## 2022-07-19 DIAGNOSIS — G629 Polyneuropathy, unspecified: Secondary | ICD-10-CM

## 2022-07-20 ENCOUNTER — Other Ambulatory Visit: Payer: Self-pay

## 2022-07-20 ENCOUNTER — Other Ambulatory Visit: Payer: Self-pay | Admitting: Internal Medicine

## 2022-07-20 DIAGNOSIS — I10 Essential (primary) hypertension: Secondary | ICD-10-CM

## 2022-07-20 DIAGNOSIS — Z8739 Personal history of other diseases of the musculoskeletal system and connective tissue: Secondary | ICD-10-CM

## 2022-07-20 MED ORDER — GABAPENTIN 300 MG PO CAPS
300.0000 mg | ORAL_CAPSULE | Freq: Every day | ORAL | 0 refills | Status: DC
  Filled 2022-07-20: qty 30, 30d supply, fill #0
  Filled 2022-09-13 – 2022-10-24 (×2): qty 30, 30d supply, fill #1

## 2022-07-20 NOTE — Telephone Encounter (Signed)
Requested Prescriptions  Pending Prescriptions Disp Refills  . gabapentin (NEURONTIN) 300 MG capsule 90 capsule 0    Sig: Take 1 capsule (300 mg total) by mouth at bedtime.     Neurology: Anticonvulsants - gabapentin Passed - 07/19/2022  8:56 PM      Passed - Cr in normal range and within 360 days    Creatinine, Ser  Date Value Ref Range Status  09/14/2021 0.62 0.57 - 1.00 mg/dL Final         Passed - Completed PHQ-2 or PHQ-9 in the last 360 days      Passed - Valid encounter within last 12 months    Recent Outpatient Visits          4 months ago Type 2 diabetes mellitus with morbid obesity ([place])   [place] Ladell Pier, MD   6 months ago Pink eye disease of right eye   [place], [place]   10 months ago Type 2 diabetes mellitus with morbid obesity [facility])   [place] [facility] Ladell Pier, MD   1 year ago Major depressive disorder, recurrent episode, moderate ([place])   [place] Ladell Pier, MD   1 year ago [place] Ladell Pier, MD

## 2022-07-20 NOTE — Telephone Encounter (Signed)
Requested Prescriptions  Pending Prescriptions Disp Refills  . allopurinol (ZYLOPRIM) 100 MG tablet [Pharmacy Med Name: Allopurinol 100 MG Oral Tablet] 90 tablet 0    Sig: Take 1 tablet by mouth once daily     Endocrinology:  Gout Agents - allopurinol Failed - 07/20/2022  6:51 AM      Failed - Uric Acid in normal range and within 360 days    Uric Acid  Date Value Ref Range Status  05/26/2020 8.3 (H) 2.6 - 6.2 mg/dL Final    Comment:               Therapeutic target for gout patients: <6.0         Failed - CBC within normal limits and completed in the last 12 months    WBC  Date Value Ref Range Status  09/14/2021 8.2 3.4 - 10.8 x10E3/uL Final  03/25/2020 6.1 4.0 - 10.5 K/uL Final   RBC  Date Value Ref Range Status  09/14/2021 4.03 3.77 - 5.28 x10E6/uL Final  03/25/2020 4.09 3.87 - 5.11 MIL/uL Final   Hemoglobin  Date Value Ref Range Status  09/14/2021 12.9 11.1 - 15.9 g/dL Final   Hematocrit  Date Value Ref Range Status  09/14/2021 38.4 34.0 - 46.6 % Final   MCHC  Date Value Ref Range Status  09/14/2021 33.6 31.5 - 35.7 g/dL Final  03/25/2020 31.8 30.0 - 36.0 g/dL Final   [facility]  Date Value Ref Range Status  09/14/2021 32.0 26.6 - 33.0 pg Final  03/25/2020 30.8 26.0 - 34.0 pg Final   MCV  Date Value Ref Range Status  09/14/2021 95 79 - 97 fL Final   No results found for: "PLTCOUNTKUC", "LABPLAT", "POCPLA" RDW  Date Value Ref Range Status  09/14/2021 13.3 11.7 - 15.4 % Final         Passed - Cr in normal range and within 360 days    Creatinine, Ser  Date Value Ref Range Status  09/14/2021 0.62 0.57 - 1.00 mg/dL Final         Passed - Valid encounter within last 12 months    Recent Outpatient Visits          4 months ago Type 2 diabetes mellitus with morbid obesity ([place])   [place] Ladell Pier, MD   6 months ago Pink eye disease of right eye   [place] Karle Plumber B, MD   10  months ago Type 2 diabetes mellitus with morbid obesity [facility])   [place] Ladell Pier, MD   1 year ago Major depressive disorder, recurrent episode, moderate ([place])   [place] Ladell Pier, MD   1 year ago Prediabetes   [place] Ladell Pier, MD             . carvedilol (COREG) 3.125 MG tablet [Pharmacy Med Name: Carvedilol 3.125 MG Oral Tablet] 180 tablet 0    Sig: TAKE 1 TABLET BY MOUTH TWICE DAILY WITH  A  MEAL.     Cardiovascular: Beta Blockers 3 Passed - 07/20/2022  6:51 AM      Passed - Cr in normal range and within 360 days    Creatinine, Ser  Date Value Ref Range Status  09/14/2021 0.62 0.57 - 1.00 mg/dL Final         Passed - AST in normal range and within  360 days    AST  Date Value Ref Range Status  09/14/2021 21 0 - 40 IU/L Final         Passed - ALT in normal range and within 360 days    ALT  Date Value Ref Range Status  09/14/2021 24 0 - 32 IU/L Final         Passed - Last BP in normal range    BP Readings from Last 1 Encounters:  03/16/22 135/82         Passed - Last Heart Rate in normal range    Pulse Readings from Last 1 Encounters:  03/16/22 86         Passed - Valid encounter within last 6 months    Recent Outpatient Visits          4 months ago Type 2 diabetes mellitus with morbid obesity ([place])   [place], MD   6 months ago Pink eye disease of right eye   [place] Ladell Pier, MD   10 months ago Type 2 diabetes mellitus with morbid obesity ([place])   [facility] Lad[facility], MD   1 year ago Major depressive disorder, recurrent episode, moderate ([place])   [place] Ladell Pier, MD   1 year ago [place] Karle Plumber B,  MD             . hydrALAZINE (APRESOLINE) 10 MG tablet [Pharmacy Med Name: hydrALAZINE HCl 10 MG Oral Tablet] 180 tablet 0    Sig: Take 1 tablet by mouth twice daily     Cardiovascular:  Vasodilators Failed - 07/20/2022  6:51 AM      Failed - ANA Screen, Ifa, Serum in normal range and within 360 days    No results found for: "ANA", "ANATITER", "LABANTI"       Passed - HCT in normal range and within 360 days    Hematocrit  Date Value Ref Range Status  09/14/2021 38.4 34.0 - 46.6 % Final         Passed - HGB in normal range and within 360 days    Hemoglobin  Date Value Ref Range Status  09/14/2021 12.9 11.1 - 15.9 g/dL Final         Passed - RBC in normal range and within 360 days    RBC  Date Value Ref Range Status  09/14/2021 4.03 3.77 - 5.28 x10E6/uL Final  03/25/2020 4.09 3.87 - 5.11 MIL/uL Final         Passed - WBC in normal range and within 360 days    WBC  Date Value Ref Range Status  09/14/2021 8.2 3.4 - 10.8 x10E3/uL Final  03/25/2020 6.1 4.0 - 10.5 K/uL Final         Passed - PLT in normal range and within 360 days    Platelets  Date Value Ref Range Status  09/14/2021 378 150 - 450 x10E3/uL Final         Passed - Last BP in normal range    BP Readings from Last 1 Encounters:  03/16/22 135/82         Passed - Valid encounter within last 12 months    Recent Outpatient Visits          4 months ago Type 2 diabetes mellitus with morbid obesity (  [facility])   [place] Ladell Pier, MD   6 months ago Pink eye disease of right eye   [place], MD   10 months ago Type 2 diabetes mellitus with morbid obesity [facility])   [place] [facility] Ladell Pier, MD   1 year ago Major depressive disorder, recurrent episode, moderate ([place])   [place] Ladell Pier, MD   1 year ago [place] Ladell Pier, MD

## 2022-07-22 ENCOUNTER — Other Ambulatory Visit: Payer: Self-pay

## 2022-07-28 ENCOUNTER — Ambulatory Visit: Payer: 59 | Admitting: Podiatry

## 2022-07-30 ENCOUNTER — Encounter: Payer: Self-pay | Admitting: Internal Medicine

## 2022-07-31 ENCOUNTER — Other Ambulatory Visit: Payer: Self-pay | Admitting: Internal Medicine

## 2022-07-31 DIAGNOSIS — F331 Major depressive disorder, recurrent, moderate: Secondary | ICD-10-CM

## 2022-07-31 MED ORDER — ESCITALOPRAM OXALATE 10 MG PO TABS: 10.0000 mg | ORAL_TABLET | Freq: Every day | ORAL | 2 refills | Status: DC

## 2022-08-11 DIAGNOSIS — N924 Excessive bleeding in the premenopausal period: Secondary | ICD-10-CM | POA: Diagnosis not present

## 2022-08-17 ENCOUNTER — Other Ambulatory Visit: Payer: Self-pay | Admitting: Internal Medicine

## 2022-08-17 ENCOUNTER — Other Ambulatory Visit: Payer: Self-pay

## 2022-08-17 MED ORDER — GLIMEPIRIDE 2 MG PO TABS
2.0000 mg | ORAL_TABLET | Freq: Every day | ORAL | 3 refills | Status: DC
  Filled 2022-08-17: qty 30, 30d supply, fill #0
  Filled 2022-09-13 – 2022-10-24 (×2): qty 30, 30d supply, fill #1
  Filled 2023-01-12: qty 30, 30d supply, fill #2

## 2022-08-19 ENCOUNTER — Other Ambulatory Visit: Payer: Self-pay

## 2022-08-30 ENCOUNTER — Ambulatory Visit: Payer: 59 | Admitting: Podiatry

## 2022-08-30 ENCOUNTER — Telehealth: Payer: Self-pay | Admitting: Podiatry

## 2022-08-30 NOTE — Telephone Encounter (Signed)
Pt left message thru  mychart  to reschedule her appt with Dr Blenda Mounts, I called pt and it just rings so not able to leave message.

## 2022-08-31 ENCOUNTER — Ambulatory Visit: Payer: No Typology Code available for payment source | Admitting: Internal Medicine

## 2022-09-07 ENCOUNTER — Ambulatory Visit: Payer: 59

## 2022-09-13 ENCOUNTER — Other Ambulatory Visit: Payer: Self-pay | Admitting: Critical Care Medicine

## 2022-09-14 ENCOUNTER — Other Ambulatory Visit: Payer: Self-pay

## 2022-09-14 ENCOUNTER — Other Ambulatory Visit: Payer: Self-pay | Admitting: Critical Care Medicine

## 2022-09-14 MED ORDER — TRUE METRIX BLOOD GLUCOSE TEST VI STRP
ORAL_STRIP | 0 refills | Status: AC
  Filled 2022-09-14 – 2022-10-24 (×2): qty 100, 25d supply, fill #0

## 2022-09-14 MED ORDER — TRUE METRIX METER W/DEVICE KIT
PACK | 0 refills | Status: AC
Start: 2022-09-14 — End: ?
  Filled 2022-09-14 – 2022-10-24 (×2): qty 1, 30d supply, fill #0

## 2022-09-14 MED ORDER — TRUEPLUS LANCETS 28G MISC
0 refills | Status: AC
  Filled 2022-09-14 – 2022-10-24 (×2): qty 100, 25d supply, fill #0

## 2022-09-15 DIAGNOSIS — Z1231 Encounter for screening mammogram for malignant neoplasm of breast: Secondary | ICD-10-CM | POA: Diagnosis not present

## 2022-09-15 LAB — HM MAMMOGRAPHY: HM Mammogram: NORMAL (ref 0–4)

## 2022-09-16 ENCOUNTER — Other Ambulatory Visit: Payer: Self-pay

## 2022-09-21 ENCOUNTER — Other Ambulatory Visit: Payer: Self-pay

## 2022-10-24 ENCOUNTER — Other Ambulatory Visit: Payer: Self-pay | Admitting: Internal Medicine

## 2022-10-24 DIAGNOSIS — I10 Essential (primary) hypertension: Secondary | ICD-10-CM

## 2022-10-25 ENCOUNTER — Other Ambulatory Visit: Payer: Self-pay

## 2022-10-30 IMAGING — MG MM DIGITAL SCREENING BILAT W/ TOMO AND CAD
6 of 10 series · 6 of 30 positions shown · non-contrast
Comparison: Previous exam(s).

ACR Breast Density Category a: The breast tissue is almost entirely
fatty.

CLINICAL DATA: Screening.

EXAM:
DIGITAL SCREENING BILATERAL MAMMOGRAM WITH TOMOSYNTHESIS AND CAD
TECHNIQUE: Bilateral screening digital craniocaudal and mediolateral oblique
mammograms were obtained. Bilateral screening digital breast
tomosynthesis was performed. The images were evaluated with
computer-aided detection.

[R MLO synth-2D]
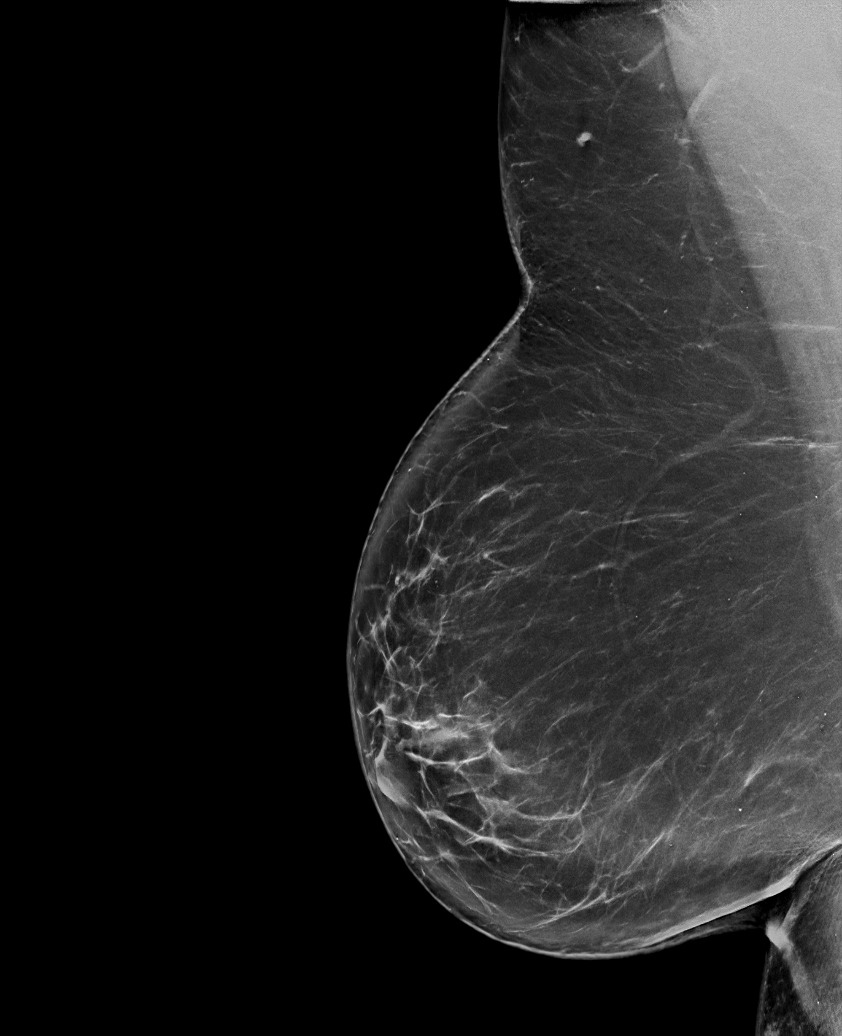

[R CC synth-2D (1 of 2)]
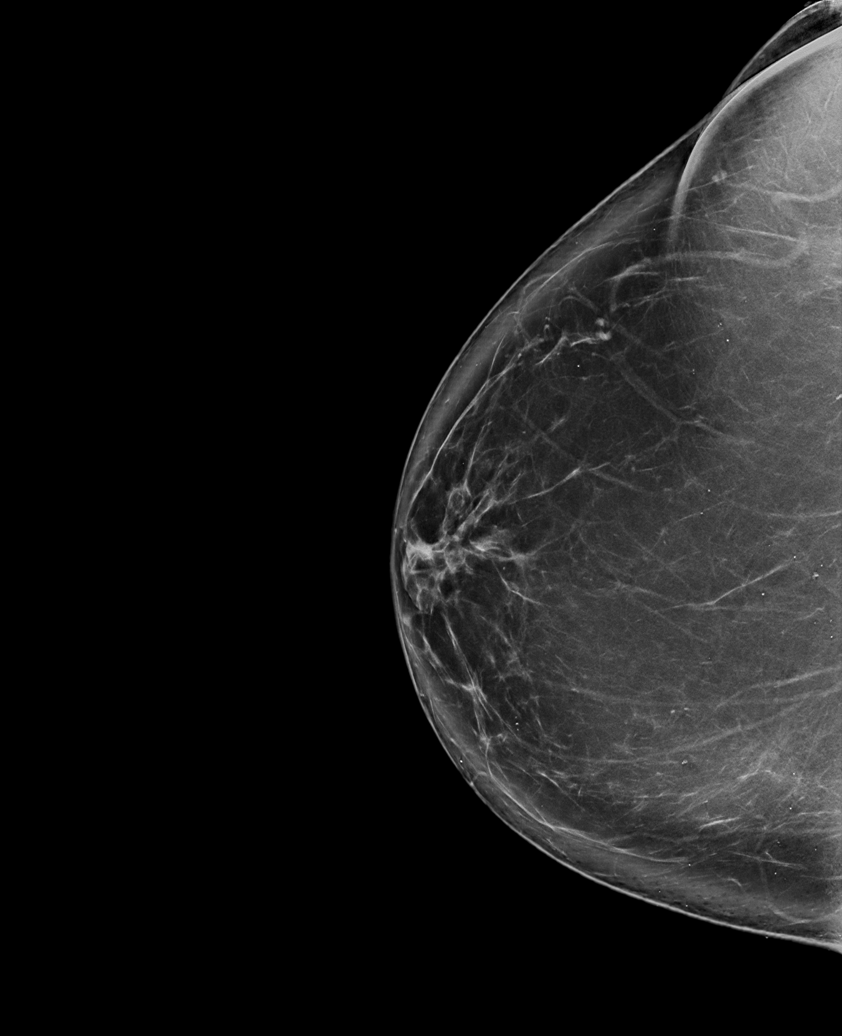

[L CC synth-2D]
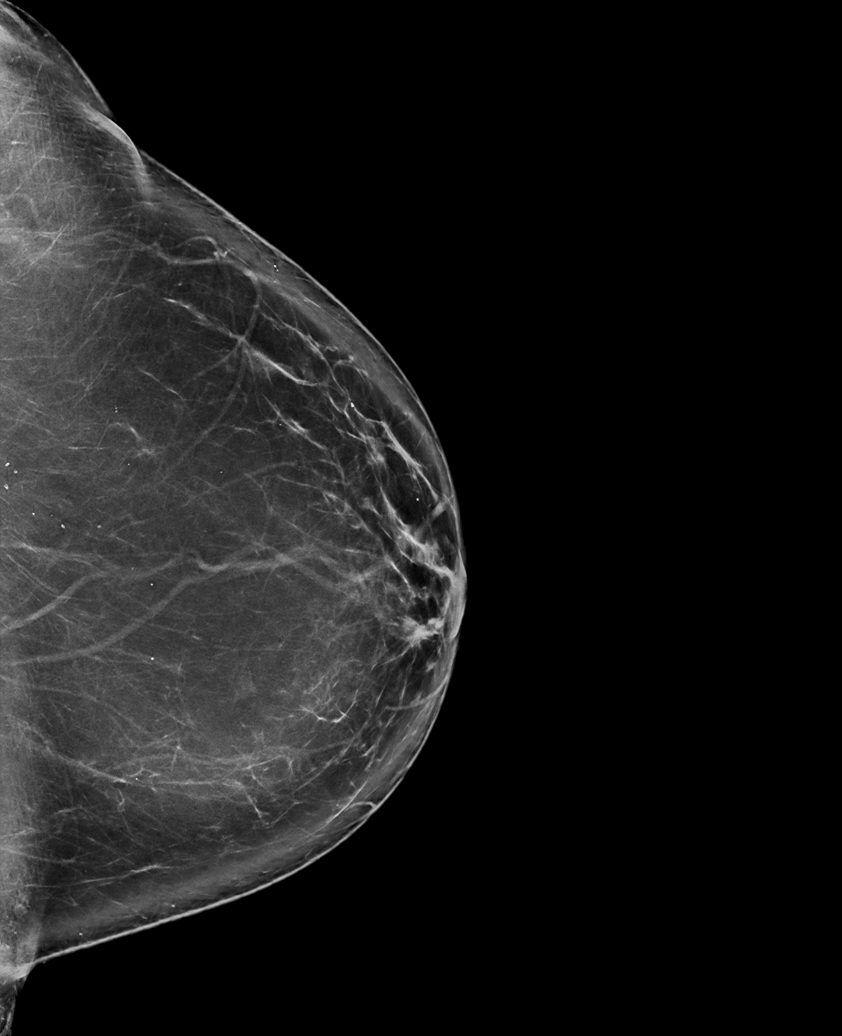

[L MLO synth-2D]
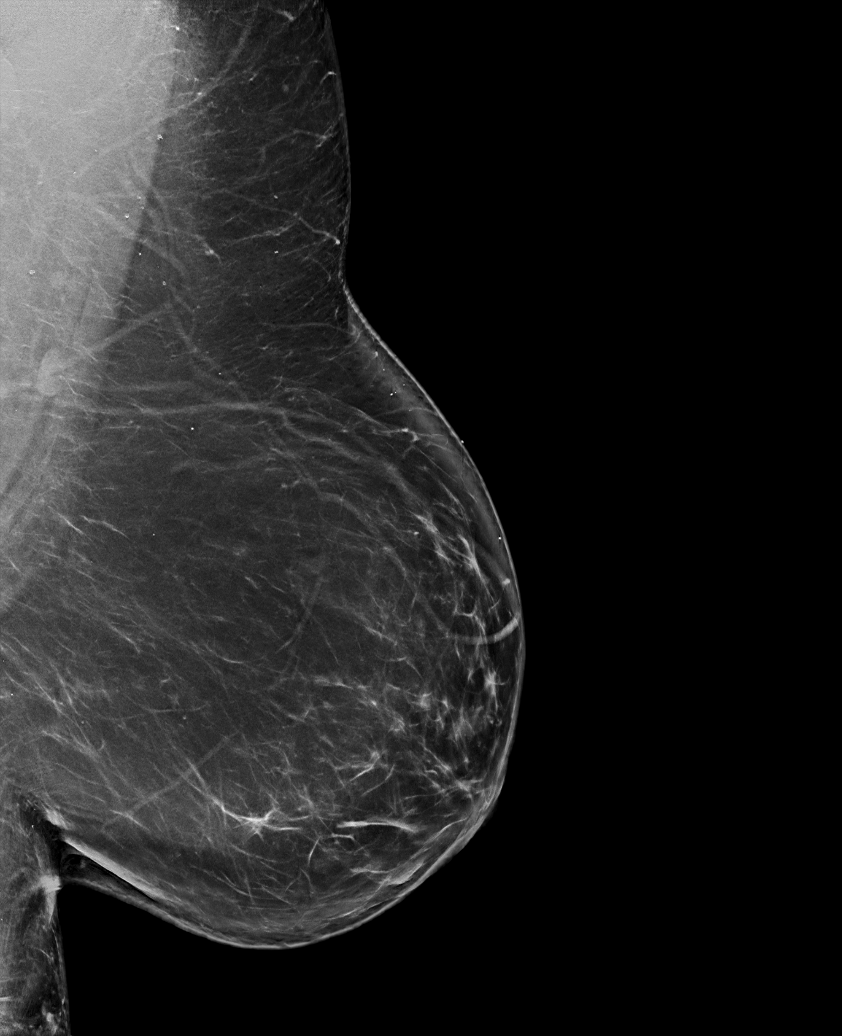

[R CC synth-2D (2 of 2)]
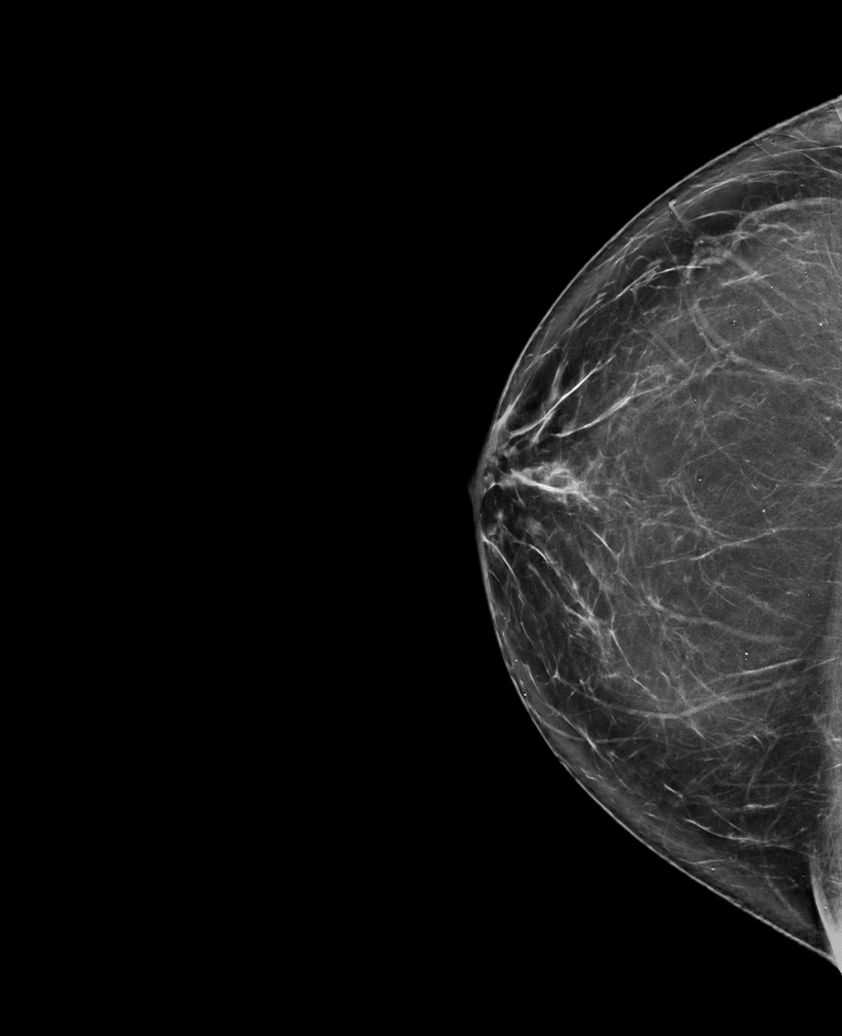

[L MLO tomo · tomo slice 51/100.0]
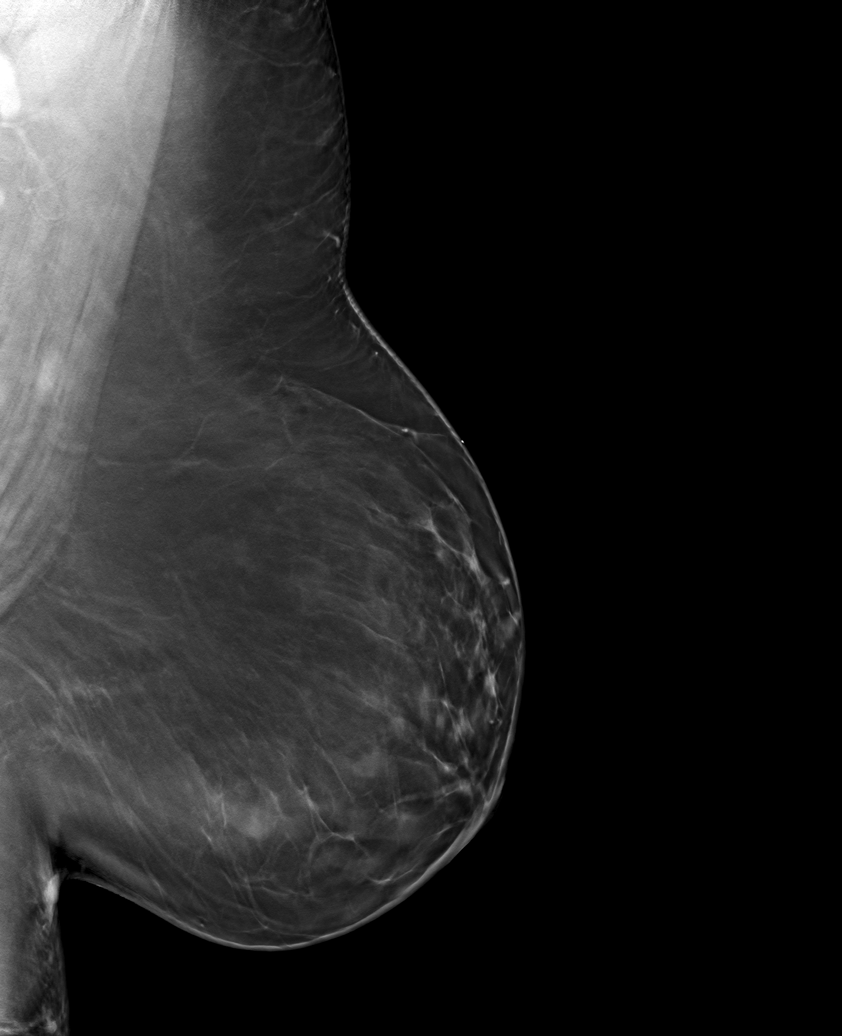

[6 of 30 positions shown; findings below may reference images not displayed]

FINDINGS: There are no findings suspicious for malignancy.
IMPRESSION: No mammographic evidence of malignancy. A result letter of this
screening mammogram will be mailed directly to the patient.

RECOMMENDATION:
Screening mammogram in one year. (Code:0E-3-N98)

BI-RADS CATEGORY  1: Negative.

## 2022-11-01 ENCOUNTER — Other Ambulatory Visit: Payer: Self-pay

## 2022-11-02 ENCOUNTER — Other Ambulatory Visit: Payer: Self-pay

## 2022-11-19 ENCOUNTER — Ambulatory Visit: Payer: 59 | Attending: Internal Medicine | Admitting: Internal Medicine

## 2022-11-19 ENCOUNTER — Encounter: Payer: Self-pay | Admitting: Internal Medicine

## 2022-11-19 DIAGNOSIS — E1169 Type 2 diabetes mellitus with other specified complication: Secondary | ICD-10-CM | POA: Diagnosis not present

## 2022-11-19 DIAGNOSIS — E1159 Type 2 diabetes mellitus with other circulatory complications: Secondary | ICD-10-CM

## 2022-11-19 DIAGNOSIS — Z23 Encounter for immunization: Secondary | ICD-10-CM | POA: Diagnosis not present

## 2022-11-19 DIAGNOSIS — E1142 Type 2 diabetes mellitus with diabetic polyneuropathy: Secondary | ICD-10-CM | POA: Diagnosis not present

## 2022-11-19 DIAGNOSIS — E538 Deficiency of other specified B group vitamins: Secondary | ICD-10-CM

## 2022-11-19 DIAGNOSIS — Z1211 Encounter for screening for malignant neoplasm of colon: Secondary | ICD-10-CM

## 2022-11-19 DIAGNOSIS — E785 Hyperlipidemia, unspecified: Secondary | ICD-10-CM | POA: Diagnosis not present

## 2022-11-19 DIAGNOSIS — I152 Hypertension secondary to endocrine disorders: Secondary | ICD-10-CM

## 2022-11-19 LAB — POCT GLYCOSYLATED HEMOGLOBIN (HGB A1C): HbA1c, POC (controlled diabetic range): 6.5 % (ref 0.0–7.0)

## 2022-11-19 MED ORDER — CARVEDILOL 3.125 MG PO TABS: 3.1250 mg | ORAL_TABLET | Freq: Two times a day (BID) | ORAL | 0 refills | Status: DC

## 2022-11-19 MED ORDER — DEXCOM G7 SENSOR MISC: 6 refills | Status: DC

## 2022-11-19 MED ORDER — ATORVASTATIN CALCIUM 20 MG PO TABS: 20.0000 mg | ORAL_TABLET | Freq: Every day | ORAL | 1 refills | Status: DC

## 2022-11-19 MED ORDER — GABAPENTIN 300 MG PO CAPS: 300.0000 mg | ORAL_CAPSULE | Freq: Every day | ORAL | 1 refills | Status: DC

## 2022-11-19 MED ORDER — LOSARTAN POTASSIUM 100 MG PO TABS: 100.0000 mg | ORAL_TABLET | Freq: Every day | ORAL | 1 refills | Status: DC

## 2022-11-19 MED ORDER — HYDRALAZINE HCL 10 MG PO TABS: 10.0000 mg | ORAL_TABLET | Freq: Two times a day (BID) | ORAL | 1 refills | Status: DC

## 2022-11-19 MED ORDER — DEXCOM G7 RECEIVER DEVI: 0 refills | Status: DC

## 2022-11-19 NOTE — Progress Notes (Signed)
Patient ID: Janet [name], female    DOB: 02/12/1971  MRN: [id number]  CC: Diabetes (Diabetes. Interested in the Dexacom/Neuropathy pain. Janet [name] received flu vax. Interested in shingles. Discuss RSV vaccine.)   Subjective: Janet [name] is a 51 y.o. female who presents for chronic ds management Her concerns today include:  Pt with hx of DM,HTN, HL, GERD, obesity, OSA, on CPAP, vit B12 def, vit D def, obesity, anx/dep, gout, psoriasis, melanoma, COVID-19 infection 03/2020  DM: Results for orders placed or performed in visit on 11/19/22  POCT glycosylated hemoglobin (Hb A1C)  Result Value Ref Range   Hemoglobin A1C     HbA1c POC (<> result, manual entry)     HbA1c, POC (prediabetic range)     HbA1c, POC (controlled diabetic range) 6.5 0.0 - 7.0 %   Wants Deccom.  Has low BS occasionally Prescribed Trulicity on last visit 03/2022.  She did not find it helpful in decreasing her appetite so went back to taking Amaryl and discontinued the Trulicity. In terms of her eating habits, she states that she has good days and bad days.  She tries to walk is much as she can.  She parks further away from buildings.  Weight is up 6 pounds since I last saw her in April. Continues to take gabapentin for neuropathy symptoms in her feet.  She reports good results with the gabapentin.  HTN: Reports compliance with taking hydralazine, Cozaar and carvedilol.  She is supposed to be on Cozaar 100 mg once a day.  However the last prescription that was sent to her pharmacy by me dated 10/25/2022 was mistakenly written as 100 mg twice a day.  Patient states she questant it when she saw it but started taking it twice a day as was written on the bottle She limits salt in the foods..  HL taking and tolerating atorvastatin 20 mg daily.  History of vitamin B12 deficiency.  She tells me that she is taking a multivitamin tablet but not a vitamin B12 tablet.  HM: She has received the flu vaccine already.  She  would like to have the shingles vaccine.  Question whether she needs to have RSV vaccine.  I told her that it is recommended for people over the age of 107.   Patient Active Problem List   Diagnosis Date Noted   Type 2 diabetes mellitus with morbid obesity ([place]) 09/14/2021   Major depressive disorder, recurrent episode, moderate ([place]) 07/03/2021   Peripheral polyneuropathy 05/14/2021   COVID-19 virus infection 03/27/2020   Glaucoma suspect of both eyes 09/17/2019   Personal history of malignant melanoma of skin 09/17/2019   Prediabetes 02/19/2019   OSA (obstructive sleep apnea) 01/28/2019   Seborrheic dermatitis of scalp 06/15/2018   Psoriasis 02/14/2018   Vitamin B 12 deficiency 02/14/2018   Vitamin D deficiency 02/14/2018   Anxiety and depression 02/03/2015   Hyperlipemia 02/03/2015   Obesity 02/03/2015   Anemia due to chronic blood loss - reports history of evaluation and told due to menorrhagia 05/30/2014   MELANOMA, TRUNK 01/24/2008   Essential hypertension 08/22/2007   GERD 08/22/2007   ABFND PAP SMEAR HGSIL 08/22/2007     Current Outpatient Medications on File Prior to Visit  Medication Sig Dispense Refill   albuterol (PROVENTIL) (2.5 MG/3ML) 0.083% nebulizer solution Take 6 mLs (5 mg total) by nebulization every 4 (four) hours as needed for wheezing. 75 mL 0   albuterol (VENTOLIN HFA) 108 (90 Base) MCG/ACT inhaler Inhale 2 puffs into  the lungs every 4 (four) hours as needed for wheezing or shortness of breath. 18 g 0   mometasone (ELOCON) 0.1 % cream Apply 1 Application topically daily.     allopurinol (ZYLOPRIM) 100 MG tablet Take 1 tablet by mouth once daily 90 tablet 0   Blood Glucose Monitoring Suppl (TRUE METRIX METER) w/Device KIT Use as directed 1 kit 0   cephALEXin (KEFLEX) 500 MG capsule Take 1 capsule (500 mg total) by mouth 2 (two) times daily. 20 capsule 0   colchicine 0.6 MG tablet TAKE ONE TABLET TONIGHT. IF NO IMPROVEMENT IN PAIN AFTER ONE HOUR REPEAT DOSE. 30  tablet 4   escitalopram (LEXAPRO) 10 MG tablet Take 1 tablet (10 mg total) by mouth daily. 30 tablet 2   furosemide (LASIX) 20 MG tablet TAKE 1 TABLET BY MOUTH AS NEEDED FOR SWELLING IN LEGS 30 tablet 3   glimepiride (AMARYL) 2 MG tablet Take 1 tablet (2 mg total) by mouth daily before breakfast. 30 tablet 3   glucose blood (TRUE METRIX BLOOD GLUCOSE TEST) test strip Use as instructed 100 each 0   meloxicam (MOBIC) 15 MG tablet Take 1 tablet (15 mg total) by mouth daily. 30 tablet 0   triamcinolone cream (KENALOG) 0.1 % Apply BID as needed to affected areas on extremities. (Patient not taking: Reported on 07/24/2020) 45 g 1   trimethoprim-polymyxin b (POLYTRIM) ophthalmic solution Place 1 drop into the right eye every 6 (six) hours. For 5-7 days 10 mL 0   TRUEplus Lancets 28G MISC USE AS DIRECTED 100 each 0   Current Facility-Administered Medications on File Prior to Visit  Medication Dose Route Frequency Provider Last Rate Last Admin   cyanocobalamin ((VITAMIN B-12)) injection 1,000 mcg  1,000 mcg Intramuscular Q30 days Ladell Pier, MD   1,000 mcg at 09/28/21 1503    Allergies  Allergen Reactions   Lidocaine Hypertension    ? Lidocaine allergy? Reports heart racing when has "shot of medicine in cevix" for leep   Hydralazine Other (See Comments)    Edema in legs   Lisinopril-Hydrochlorothiazide Other (See Comments)    Leg cramps.   Metformin And Related Diarrhea   Norvasc [Amlodipine Besylate] Other (See Comments)    Leg pain   Penicillins Itching    Social History   Socioeconomic History   Marital status: Single    Spouse name: Not on file   Number of children: 1   Years of education: Not on file   Highest education level: Associate degree: academic program  Occupational History   Not on file  Tobacco Use   Smoking status: Never   Smokeless tobacco: Never  Vaping Use   Vaping Use: Never used  Substance and Sexual Activity   Alcohol use: No   Drug use: No   Sexual  activity: Not Currently    Partners: Male    Birth control/protection: None  Other Topics Concern   Not on file  Social History Narrative   Work or School: daycare in infant room      Home Situation: lives alone      Spiritual Beliefs: none      Lifestyle: no regular exercise; diet is poor            Social Determinants of Health   Financial Resource Strain: Not on file  Food Insecurity: No Food Insecurity (08/13/2021)   Hunger Vital Sign    Worried About Running Out of Food in the Last Year: Never true  Ran Out of Food in the Last Year: Never true  Transportation Needs: No Transportation Needs (08/13/2021)   PRAPARE - Hydrologist (Medical): No    Lack of Transportation (Non-Medical): No  Physical Activity: Sufficiently Active (10/18/2017)   Exercise Vital Sign    Days of Exercise per Week: 5 days    Minutes of Exercise per Session: 60 min  Stress: Stress Concern Present (10/18/2017)   [place]    Feeling of Stress : Very much  Social Connections: Somewhat Isolated (10/18/2017)   Social Connection and Isolation Panel [NHANES]    Frequency of Communication with Friends and Family: More than three times a week    Frequency of Social Gatherings with Friends and Family: More than three times a week    Attends Religious Services: More than 4 times per year    Active Member of [employer] or Organizations: No    Attends Archivist Meetings: Never    Marital Status: Divorced  Human resources officer Violence: Not At Risk (10/18/2017)   Humiliation, Afraid, Rape, and Kick questionnaire    Fear of Current or Ex-Partner: No    Emotionally Abused: No    Physically Abused: No    Sexually Abused: No    Family History  Problem Relation Age of Onset   Diabetes Mother    Hypertension Mother    Fibroids Mother    Colon polyps Father    Heart disease Father    Hypertension Father     Heart attack Father    Cancer Father        liver cancer   Diabetes Brother    Hypertension Brother    Diabetes Brother    Breast cancer Neg Hx    Colon cancer Neg Hx    Esophageal cancer Neg Hx    Stomach cancer Neg Hx    Rectal cancer Neg Hx     Past Surgical History:  Procedure Laterality Date   LEEP  2009   CIN I negative margin   SKIN CANCER EXCISION  2010   chest area    ROS: Review of Systems Negative except as stated above  PHYSICAL EXAM: BP 129/82 (BP Location: Left Arm, Patient Position: Sitting, Cuff Size: Large)   Pulse 80   Temp 97.6 F (36.4 C) (Oral)   Ht _0  (1.702 m)   Wt (!) 303 lb (137.4 kg)   SpO2 97%   BMI 47.46 kg/m   Wt Readings from Last 3 Encounters:  11/19/22 (!) 303 lb (137.4 kg)  03/11/22 297 lb 6.4 oz (134.9 kg)  10/07/21 290 lb (131.5 kg)    Physical Exam  General appearance - alert, well appearing, obese Caucasian female and in no distress Mental status - alert, oriented to person, place, and time Neck - supple, no significant adenopathy Chest - clear to auscultation, no wheezes, rales or rhonchi, symmetric air entry Heart - normal rate, regular rhythm, normal S1, S2, no murmurs, rubs, clicks or gallops Extremities - peripheral pulses normal, no pedal edema, no clubbing or cyanosis Diabetic Foot Exam - Simple   Simple Foot Form Diabetic Foot exam was performed with the following findings: Yes 11/19/2022  5:40 PM  Visual Inspection No deformities, no ulcerations, no other skin breakdown bilaterally: Yes Sensation Testing Intact to touch and monofilament testing bilaterally: Yes Pulse Check Posterior Tibialis and Dorsalis pulse intact bilaterally: Yes Comments  Latest Ref Rng & Units 09/14/2021    4:25 PM 10/09/2020    4:57 PM 06/23/2020    3:48 PM  CMP  Glucose 70 - 99 mg/dL 105  100    BUN 6 - 24 mg/dL 10  13    Creatinine 0.57 - 1.00 mg/dL 0.62  0.67    Sodium 134 - 144 mmol/L 141  141    Potassium 3.5 -  5.2 mmol/L 4.2  3.9    Chloride 96 - 106 mmol/L 101  103    CO2 20 - 29 mmol/L 24  25    Calcium 8.7 - 10.2 mg/dL 10.0  9.6    Total Protein 6.0 - 8.5 g/dL 7.0  6.9  7.0   Total Bilirubin 0.0 - 1.2 mg/dL 0.2  <0.2  0.3   Alkaline Phos 44 - 121 IU/L 97  72  92   AST 0 - 40 IU/L _0 ALT 0 - 32 IU/L _1 Lipid Panel     Component Value Date/Time   CHOL 188 09/14/2021 1625   TRIG 148 09/14/2021 1625   HDL 60 09/14/2021 1625   CHOLHDL 3.1 09/14/2021 1625   CHOLHDL 4.0 12/14/2016 1421   VLDL 32 (H) 12/14/2016 1421   LDLCALC 102 (H) 09/14/2021 1625    CBC    Component Value Date/Time   WBC 8.2 09/14/2021 1625   WBC 6.1 03/25/2020 1609   RBC 4.03 09/14/2021 1625   RBC 4.09 03/25/2020 1609   HGB 12.9 09/14/2021 1625   HCT 38.4 09/14/2021 1625   PLT 378 09/14/2021 1625   MCV 95 09/14/2021 1625   MCH 32.0 09/14/2021 1625   MCH 30.8 03/25/2020 1609   MCHC 33.6 09/14/2021 1625   MCHC 31.8 03/25/2020 1609   RDW 13.3 09/14/2021 1625   LYMPHSABS 3.0 09/29/2017 1544   MONOABS 0.6 10/11/2016 0745   EOSABS 0.2 09/29/2017 1544   BASOSABS 0.0 09/29/2017 1544    ASSESSMENT AND PLAN: 1. Type 2 diabetes mellitus with morbid obesity ([place]) At goal.  She will continue Amaryl 2 mg daily.  Trulicity taken off med list. Discussed and encourage healthy eating habits.  We discussed trying to adhere to healthy eating habits through the week and then having 1 day on the weekend where she can treat herself.  Encouraged to walk is much as she can. Patient advised that I do not know if her insurance will pay for the Dexcom device given that she is not on insulin but we can go ahead and send it to see. -She is overdue for diabetic eye exam.  Patient states she was called by Dr. Ammie Ferrier office today to schedule her eye appointment.  She plans to call them back. - POCT glycosylated hemoglobin (Hb A1C) - CBC; Future - Comprehensive metabolic panel; Future - Lipid panel; Future -  Microalbumin / creatinine urine ratio; Future  2. Diabetic peripheral neuropathy (HCC) Stable on gabapentin. - gabapentin (NEURONTIN) 300 MG capsule; Take 1 capsule (300 mg total) by mouth at bedtime.  Dispense: 90 capsule; Refill: 1  3. Hypertension associated with type 2 diabetes mellitus ([place]) Close to goal.  I have informed the patient about the Cozaar and have sent an updated prescription to her pharmacy reflecting the correct dosing of 1 tablet once a day.  Continue Cozaar 3.125 mg twice a day and hydralazine 10 mg twice a day. - losartan (COZAAR) 100 MG tablet; Take 1  tablet (100 mg total) by mouth daily.  Dispense: 90 tablet; Refill: 1 - carvedilol (COREG) 3.125 MG tablet; Take 1 tablet (3.125 mg total) by mouth 2 (two) times daily with a meal.  Dispense: 180 tablet; Refill: 0 - hydrALAZINE (APRESOLINE) 10 MG tablet; Take 1 tablet (10 mg total) by mouth 2 (two) times daily.  Dispense: 180 tablet; Refill: 1  4. Hyperlipidemia associated with type 2 diabetes mellitus (HCC) - atorvastatin (LIPITOR) 20 MG tablet; Take 1 tablet (20 mg total) by mouth daily.  Dispense: 90 tablet; Refill: 1  5. Need for shingles vaccine First Shingrix vaccine given today.  6. Screening for colon cancer Discussed colon cancer screening.  She prefers to have the Cologuard. - Cologuard  7. Vitamin B 12 deficiency - Vitamin B12; Future     Patient was given the opportunity to ask questions.  Patient verbalized understanding of the plan and was able to repeat key elements of the plan.   This documentation was completed using Radio producer.  Any transcriptional errors are unintentional.  Orders Placed This Encounter  Procedures   Cologuard   CBC   Comprehensive metabolic panel   Lipid panel   Microalbumin / creatinine urine ratio   Vitamin B12   POCT glycosylated hemoglobin (Hb A1C)     Requested Prescriptions   Signed Prescriptions Disp Refills   losartan (COZAAR) 100  MG tablet 90 tablet 1    Sig: Take 1 tablet (100 mg total) by mouth daily.   atorvastatin (LIPITOR) 20 MG tablet 90 tablet 1    Sig: Take 1 tablet (20 mg total) by mouth daily.   carvedilol (COREG) 3.125 MG tablet 180 tablet 0    Sig: Take 1 tablet (3.125 mg total) by mouth 2 (two) times daily with a meal.   hydrALAZINE (APRESOLINE) 10 MG tablet 180 tablet 1    Sig: Take 1 tablet (10 mg total) by mouth 2 (two) times daily.   gabapentin (NEURONTIN) 300 MG capsule 90 capsule 1    Sig: Take 1 capsule (300 mg total) by mouth at bedtime.    Return in about 4 months (around 03/21/2023) for Give lab visit appt for next Wednesday.Karle Plumber, MD, FACP

## 2022-11-19 NOTE — Patient Instructions (Signed)
Losartan was mistakenly written as twice a day dosing.  It should be 100 mg ONCE A DAY.  An updated prescription has been sent to your pharmacy.

## 2022-11-22 ENCOUNTER — Other Ambulatory Visit: Payer: Self-pay | Admitting: Internal Medicine

## 2022-11-22 ENCOUNTER — Encounter (HOSPITAL_BASED_OUTPATIENT_CLINIC_OR_DEPARTMENT_OTHER): Payer: 59 | Admitting: Internal Medicine

## 2022-11-22 DIAGNOSIS — U071 COVID-19: Secondary | ICD-10-CM | POA: Diagnosis not present

## 2022-11-22 MED ORDER — DEXCOM G7 RECEIVER DEVI
0 refills | Status: AC
  Filled 2022-11-22: qty 1, 365d supply, fill #0

## 2022-11-22 MED ORDER — DEXCOM G7 SENSOR MISC
6 refills | Status: AC
  Filled 2022-11-22: qty 3, 30d supply, fill #0

## 2022-11-23 ENCOUNTER — Other Ambulatory Visit: Payer: Self-pay

## 2022-11-23 ENCOUNTER — Other Ambulatory Visit (HOSPITAL_COMMUNITY): Payer: Self-pay

## 2022-11-24 ENCOUNTER — Other Ambulatory Visit: Payer: 59

## 2022-11-25 ENCOUNTER — Other Ambulatory Visit: Payer: Self-pay

## 2022-11-26 ENCOUNTER — Ambulatory Visit: Payer: 59 | Attending: Internal Medicine

## 2022-11-26 DIAGNOSIS — E538 Deficiency of other specified B group vitamins: Secondary | ICD-10-CM

## 2022-11-26 DIAGNOSIS — E1169 Type 2 diabetes mellitus with other specified complication: Secondary | ICD-10-CM | POA: Diagnosis not present

## 2022-11-27 LAB — COMPREHENSIVE METABOLIC PANEL
ALT: 17 IU/L (ref 0–32)
AST: 20 IU/L (ref 0–40)
Albumin/Globulin Ratio: 1.3 (ref 1.2–2.2)
Albumin: 3.9 g/dL (ref 3.9–4.9)
Alkaline Phosphatase: 71 IU/L (ref 44–121)
BUN/Creatinine Ratio: 16 (ref 9–23)
BUN: 11 mg/dL (ref 6–24)
Bilirubin Total: 0.2 mg/dL (ref 0.0–1.2)
CO2: 25 mmol/L (ref 20–29)
Calcium: 9.2 mg/dL (ref 8.7–10.2)
Chloride: 101 mmol/L (ref 96–106)
Creatinine, Ser: 0.7 mg/dL (ref 0.57–1.00)
Globulin, Total: 2.9 g/dL (ref 1.5–4.5)
Glucose: 143 mg/dL — ABNORMAL HIGH (ref 70–99)
Potassium: 4.1 mmol/L (ref 3.5–5.2)
Sodium: 141 mmol/L (ref 134–144)
Total Protein: 6.8 g/dL (ref 6.0–8.5)
eGFR: 105 mL/min/{1.73_m2} (ref >59)

## 2022-11-27 LAB — CBC
Hematocrit: 40 % (ref 34.0–46.6)
Hemoglobin: 13.1 g/dL (ref 11.1–15.9)
MCH: 30.8 pg (ref 26.6–33.0)
MCHC: 32.8 g/dL (ref 31.5–35.7)
MCV: 94 fL (ref 79–97)
Platelets: 247 10*3/uL (ref 150–450)
RBC: 4.25 x10E6/uL (ref 3.77–5.28)
RDW: 12.5 % (ref 11.7–15.4)
WBC: 4.8 10*3/uL (ref 3.4–10.8)

## 2022-11-27 LAB — MICROALBUMIN / CREATININE URINE RATIO
Creatinine, Urine: 96.6 mg/dL
Microalb/Creat Ratio: 108 mg/g creat — ABNORMAL HIGH (ref 0–29)
Microalbumin, Urine: 104.8 ug/mL

## 2022-11-27 LAB — LIPID PANEL
Chol/HDL Ratio: 3.5 ratio (ref 0.0–4.4)
Cholesterol, Total: 197 mg/dL (ref 100–199)
HDL: 56 mg/dL (ref >39)
LDL Chol Calc (NIH): 110 mg/dL — ABNORMAL HIGH (ref 0–99)
Triglycerides: 176 mg/dL — ABNORMAL HIGH (ref 0–149)
VLDL Cholesterol Cal: 31 mg/dL (ref 5–40)

## 2022-11-27 LAB — VITAMIN B12: Vitamin B-12: >2000 pg/mL — ABNORMAL HIGH (ref 232–1245)

## 2022-11-30 ENCOUNTER — Other Ambulatory Visit: Payer: Self-pay | Admitting: Internal Medicine

## 2022-11-30 ENCOUNTER — Other Ambulatory Visit: Payer: Self-pay

## 2022-11-30 DIAGNOSIS — F331 Major depressive disorder, recurrent, moderate: Secondary | ICD-10-CM

## 2022-12-13 MED ORDER — NIRMATRELVIR/RITONAVIR (PAXLOVID)TABLET: 3.0000 | ORAL_TABLET | Freq: Two times a day (BID) | ORAL | 0 refills | Status: AC

## 2022-12-13 NOTE — Telephone Encounter (Signed)
Please see the MyChart message reply(ies) for my assessment and plan.    This patient gave consent for this Medical Advice Message and is aware that it may result in a bill to [employer], as well as the possibility of receiving a bill for a co-payment or deductible. They are an established patient, but are not seeking medical advice exclusively about a problem treated during an in person or video visit in the last seven days. I did not recommend an in person or video visit within seven days of my reply.    I spent a total of 7 minutes cumulative time within 7 days through [employer].  Karle Plumber, MD

## 2023-01-05 ENCOUNTER — Ambulatory Visit: Payer: 59 | Attending: Internal Medicine

## 2023-01-05 DIAGNOSIS — Z23 Encounter for immunization: Secondary | ICD-10-CM

## 2023-01-05 NOTE — Progress Notes (Signed)
Shingrix vaccine administered  right deltoid per protocols.  Information sheet given. Patient denies and pain or discomfort at injection site. Tolerated injection well no reaction.

## 2023-01-12 ENCOUNTER — Other Ambulatory Visit: Payer: Self-pay | Admitting: Internal Medicine

## 2023-01-12 DIAGNOSIS — Z8739 Personal history of other diseases of the musculoskeletal system and connective tissue: Secondary | ICD-10-CM

## 2023-01-19 ENCOUNTER — Other Ambulatory Visit: Payer: Self-pay

## 2023-01-30 ENCOUNTER — Encounter: Payer: Self-pay | Admitting: Internal Medicine

## 2023-02-21 LAB — HM DIABETES EYE EXAM

## 2023-03-01 DIAGNOSIS — G4733 Obstructive sleep apnea (adult) (pediatric): Secondary | ICD-10-CM | POA: Diagnosis not present

## 2023-03-03 ENCOUNTER — Encounter: Payer: Self-pay | Admitting: Internal Medicine

## 2023-03-09 ENCOUNTER — Other Ambulatory Visit: Payer: Self-pay

## 2023-03-13 ENCOUNTER — Other Ambulatory Visit: Payer: Self-pay | Admitting: Internal Medicine

## 2023-03-13 DIAGNOSIS — F331 Major depressive disorder, recurrent, moderate: Secondary | ICD-10-CM

## 2023-03-17 ENCOUNTER — Ambulatory Visit: Payer: 59 | Admitting: Internal Medicine

## 2023-03-22 ENCOUNTER — Ambulatory Visit: Payer: Self-pay | Admitting: Internal Medicine

## 2023-04-01 DIAGNOSIS — G4733 Obstructive sleep apnea (adult) (pediatric): Secondary | ICD-10-CM | POA: Diagnosis not present

## 2023-04-24 ENCOUNTER — Encounter: Payer: Self-pay | Admitting: Internal Medicine

## 2023-04-25 ENCOUNTER — Other Ambulatory Visit: Payer: Self-pay | Admitting: Internal Medicine

## 2023-04-25 DIAGNOSIS — F331 Major depressive disorder, recurrent, moderate: Secondary | ICD-10-CM

## 2023-04-25 MED ORDER — ESCITALOPRAM OXALATE 20 MG PO TABS: 20.0000 mg | ORAL_TABLET | Freq: Every day | ORAL | 4 refills | Status: DC

## 2023-05-18 ENCOUNTER — Ambulatory Visit: Payer: 59 | Admitting: Orthopedic Surgery

## 2023-06-06 ENCOUNTER — Encounter: Payer: Self-pay | Admitting: Internal Medicine

## 2023-06-27 ENCOUNTER — Encounter: Payer: Self-pay | Admitting: Internal Medicine

## 2023-06-27 ENCOUNTER — Ambulatory Visit: Payer: 59 | Admitting: Internal Medicine

## 2023-07-13 ENCOUNTER — Other Ambulatory Visit: Payer: Self-pay | Admitting: Internal Medicine

## 2023-07-13 DIAGNOSIS — Z1231 Encounter for screening mammogram for malignant neoplasm of breast: Secondary | ICD-10-CM

## 2023-07-14 ENCOUNTER — Other Ambulatory Visit: Payer: Self-pay

## 2023-07-14 ENCOUNTER — Ambulatory Visit: Payer: 59 | Attending: Internal Medicine | Admitting: Internal Medicine

## 2023-07-14 ENCOUNTER — Encounter: Payer: Self-pay | Admitting: Internal Medicine

## 2023-07-14 DIAGNOSIS — F331 Major depressive disorder, recurrent, moderate: Secondary | ICD-10-CM

## 2023-07-14 DIAGNOSIS — Z85820 Personal history of malignant melanoma of skin: Secondary | ICD-10-CM | POA: Diagnosis not present

## 2023-07-14 DIAGNOSIS — E785 Hyperlipidemia, unspecified: Secondary | ICD-10-CM

## 2023-07-14 DIAGNOSIS — E1169 Type 2 diabetes mellitus with other specified complication: Secondary | ICD-10-CM

## 2023-07-14 DIAGNOSIS — I152 Hypertension secondary to endocrine disorders: Secondary | ICD-10-CM | POA: Diagnosis not present

## 2023-07-14 DIAGNOSIS — N951 Menopausal and female climacteric states: Secondary | ICD-10-CM | POA: Diagnosis not present

## 2023-07-14 DIAGNOSIS — R079 Chest pain, unspecified: Secondary | ICD-10-CM | POA: Diagnosis not present

## 2023-07-14 DIAGNOSIS — Z1211 Encounter for screening for malignant neoplasm of colon: Secondary | ICD-10-CM

## 2023-07-14 DIAGNOSIS — E1159 Type 2 diabetes mellitus with other circulatory complications: Secondary | ICD-10-CM

## 2023-07-14 DIAGNOSIS — Z6841 Body Mass Index (BMI) 40.0 and over, adult: Secondary | ICD-10-CM

## 2023-07-14 DIAGNOSIS — Z7984 Long term (current) use of oral hypoglycemic drugs: Secondary | ICD-10-CM

## 2023-07-14 DIAGNOSIS — E119 Type 2 diabetes mellitus without complications: Secondary | ICD-10-CM

## 2023-07-14 LAB — POCT GLYCOSYLATED HEMOGLOBIN (HGB A1C): HbA1c, POC (controlled diabetic range): 7.2 % — AB (ref 0.0–7.0)

## 2023-07-14 LAB — GLUCOSE, POCT (MANUAL RESULT ENTRY): POC Glucose: 157 mg/dl — AB (ref 70–99)

## 2023-07-14 MED ORDER — VEOZAH 45 MG PO TABS: 45.0000 mg | ORAL_TABLET | Freq: Every day | ORAL | 4 refills | Status: DC

## 2023-07-14 NOTE — Addendum Note (Signed)
Addended by: Jonah Blue B on: 07/14/2023 12:59 PM   Modules accepted: Level of Service

## 2023-07-14 NOTE — Progress Notes (Signed)
Patient ID: Janet [name], female    DOB: Sep 19, 1971  MRN: [id number]  CC: Diabetes (DM f/u. /Intermittent burning sensation on L chest radiating to L shoulder/Requesting new cologuard kit)   Subjective: Janet [name] is a 52 y.o. female who presents for chronic ds management. Her concerns today include:  Pt with hx of DM,HTN, HL, GERD, obesity, OSA, on CPAP, vit B12 def, vit D def, obesity, anx/dep, gout, psoriasis, melanoma, COVID-19 infection 03/2020   DM: Results for orders placed or performed in visit on 07/14/23  POCT glycosylated hemoglobin (Hb A1C)  Result Value Ref Range   Hemoglobin A1C     HbA1c POC (<> result, manual entry)     HbA1c, POC (prediabetic range)     HbA1c, POC (controlled diabetic range) 7.2 (A) 0.0 - 7.0 %  POCT glucose (manual entry)  Result Value Ref Range   POC Glucose 157 (A) 70 - 99 mg/dl  W0J on last visit was 6.5.  Currently on Amaryl 2 mg daily and taking consistently. BS check in a.m before BF range 119-127. Getting ready to join [employer].  Feels she eats smaller portions. Very little exercise.  "I have no ambition."  When she is off she just wants to sleep. Does have gym in her apartment complex.  Using CPAP consistently.  Assist in care of mom.  Feels depression plans a role in lack of motivation.  We had increased dose of Lexapro since last visit to 20 mg; finds increase dose helpful. -thinks menopause contributes.  Hot flashes very bothersome.  Sleeps with ceiling fan on and keeps temp at 74.    HTN: Reports compliance with taking hydralazine 10 mg twice a day, Cozaar 100 mg daily and carvedilol 3.125 mg BID. Took morning meds already.  No device at home to check BP.   C/o 2 episodes of burning in the center of upper chest radiating to LT shoulder over past few wks. Both episodes occurred when she was bending over. Lasted 1-2 mins.  Recent trip to [place] where she did at of walking.  No CP there.  Father dx with CAD in his 72's; pt does  not smoker  HL:  taking and tolerating Lipitor 20 mg daily  Patient with history of melanoma of the skin affecting the upper area of the left breast.  Patient states this was removed many years ago.  She does have a dermatologist who she follows with for her psoriasis.  HM: Cologuard was ordered on last visit 11/2023.  Patient reports the packet that she received was torn up so she would not be able to send it back.  Due for Tdap, flu shot and COVID 19 booster. Due for MMG.  Reports last one was done last yr 07/2022 through [facility].  Patient Active Problem List   Diagnosis Date Noted   Type 2 diabetes mellitus with morbid obesity (HCC) 09/14/2021   Major depressive disorder, recurrent episode, moderate (HCC) 07/03/2021   Peripheral polyneuropathy 05/14/2021   COVID-19 virus infection 03/27/2020   Glaucoma suspect of both eyes 09/17/2019   Personal history of malignant melanoma of skin 09/17/2019   Prediabetes 02/19/2019   OSA (obstructive sleep apnea) 01/28/2019   Seborrheic dermatitis of scalp 06/15/2018   Psoriasis 02/14/2018   Vitamin B 12 deficiency 02/14/2018   Vitamin D deficiency 02/14/2018   Anxiety and depression 02/03/2015   Hyperlipemia 02/03/2015   Obesity 02/03/2015   Anemia due to chronic blood loss - reports history  of evaluation and told due to menorrhagia 05/30/2014   Essential hypertension 08/22/2007   GERD 08/22/2007   ABFND PAP SMEAR HGSIL 08/22/2007     Current Outpatient Medications on File Prior to Visit  Medication Sig Dispense Refill   albuterol (PROVENTIL) (2.5 MG/3ML) 0.083% nebulizer solution Take 6 mLs (5 mg total) by nebulization every 4 (four) hours as needed for wheezing. 75 mL 0   albuterol (VENTOLIN HFA) 108 (90 Base) MCG/ACT inhaler Inhale 2 puffs into the lungs every 4 (four) hours as needed for wheezing or shortness of breath. 18 g 0   allopurinol (ZYLOPRIM) 100 MG tablet Take 1 tablet by mouth once daily 90 tablet 0    atorvastatin (LIPITOR) 20 MG tablet Take 1 tablet (20 mg total) by mouth daily. 90 tablet 1   Blood Glucose Monitoring Suppl (TRUE METRIX METER) w/Device KIT Use as directed 1 kit 0   carvedilol (COREG) 3.125 MG tablet Take 1 tablet (3.125 mg total) by mouth 2 (two) times daily with a meal. 180 tablet 0   colchicine 0.6 MG tablet TAKE ONE TABLET TONIGHT. IF NO IMPROVEMENT IN PAIN AFTER ONE HOUR REPEAT DOSE. 30 tablet 4   Continuous Blood Gluc Receiver (DEXCOM G7 RECEIVER) DEVI Use as directed. 1 each 0   Continuous Blood Gluc Sensor (DEXCOM G7 SENSOR) MISC Change sensor every 10 days. 3 each 6   escitalopram (LEXAPRO) 20 MG tablet Take 1 tablet (20 mg total) by mouth daily. 30 tablet 4   furosemide (LASIX) 20 MG tablet TAKE 1 TABLET BY MOUTH AS NEEDED FOR SWELLING IN LEGS 30 tablet 3   gabapentin (NEURONTIN) 300 MG capsule Take 1 capsule (300 mg total) by mouth at bedtime. 90 capsule 1   glimepiride (AMARYL) 2 MG tablet Take 1 tablet (2 mg total) by mouth daily before breakfast. 30 tablet 3   glucose blood (TRUE METRIX BLOOD GLUCOSE TEST) test strip Use as instructed 100 each 0   hydrALAZINE (APRESOLINE) 10 MG tablet Take 1 tablet (10 mg total) by mouth 2 (two) times daily. 180 tablet 1   losartan (COZAAR) 100 MG tablet Take 1 tablet (100 mg total) by mouth daily. 90 tablet 1   meloxicam (MOBIC) 15 MG tablet Take 1 tablet (15 mg total) by mouth daily. 30 tablet 0   mometasone (ELOCON) 0.1 % cream Apply 1 Application topically daily.     triamcinolone cream (KENALOG) 0.1 % Apply BID as needed to affected areas on extremities. (Patient not taking: Reported on 07/24/2020) 45 g 1   trimethoprim-polymyxin b (POLYTRIM) ophthalmic solution Place 1 drop into the right eye every 6 (six) hours. For 5-7 days 10 mL 0   TRUEplus Lancets 28G MISC USE AS DIRECTED 100 each 0   Current Facility-Administered Medications on File Prior to Visit  Medication Dose Route Frequency Provider Last Rate Last Admin    cyanocobalamin ((VITAMIN B-12)) injection 1,000 mcg  1,000 mcg Intramuscular Q30 days Marcine Matar, MD   1,000 mcg at 09/28/21 1503    Allergies  Allergen Reactions   Lidocaine Hypertension    ? Lidocaine allergy? Reports heart racing when has "shot of medicine in cevix" for leep   Hydralazine Other (See Comments)    Edema in legs   Lisinopril-Hydrochlorothiazide Other (See Comments)    Leg cramps.   Metformin And Related Diarrhea   Norvasc [Amlodipine Besylate] Other (See Comments)    Leg pain   Penicillins Itching    Social History   Socioeconomic History  Marital status: Single    Spouse name: Not on file   Number of children: 1   Years of education: Not on file   Highest education level: Associate degree: academic program  Occupational History   Not on file  Tobacco Use   Smoking status: Never   Smokeless tobacco: Never  Vaping Use   Vaping status: Never Used  Substance and Sexual Activity   Alcohol use: No   Drug use: No   Sexual activity: Not Currently    Partners: Male    Birth control/protection: None  Other Topics Concern   Not on file  Social History Narrative   Work or School: daycare in infant room      Home Situation: lives alone      Spiritual Beliefs: none      Lifestyle: no regular exercise; diet is poor            Social Determinants of Corporate investment banker Strain: Not on file  Food Insecurity: No Food Insecurity (08/13/2021)   Hunger Vital Sign    Worried About Running Out of Food in the Last Year: Never true    Ran Out of Food in the Last Year: Never true  Transportation Needs: No Transportation Needs (08/13/2021)   PRAPARE - Administrator, Civil Service (Medical): No    Lack of Transportation (Non-Medical): No  Physical Activity: Sufficiently Active (10/18/2017)   Exercise Vital Sign    Days of Exercise per Week: 5 days    Minutes of Exercise per Session: 60 min  Stress: Stress Concern Present (10/18/2017)    [employer] of Occupational Health - Occupational Stress Questionnaire    Feeling of Stress : Very much  Social Connections: Somewhat Isolated (10/18/2017)   Social Connection and Isolation Panel [NHANES]    Frequency of Communication with Friends and Family: More than three times a week    Frequency of Social Gatherings with Friends and Family: More than three times a week    Attends Religious Services: More than 4 times per year    Active Member of [employer] or Organizations: No    Attends Banker Meetings: Never    Marital Status: Divorced  Catering manager Violence: Not At Risk (10/18/2017)   Humiliation, Afraid, Rape, and Kick questionnaire    Fear of Current or Ex-Partner: No    Emotionally Abused: No    Physically Abused: No    Sexually Abused: No    Family History  Problem Relation Age of Onset   Diabetes Mother    Hypertension Mother    Fibroids Mother    Colon polyps Father    Heart disease Father    Hypertension Father    Heart attack Father    Cancer Father        liver cancer   Diabetes Brother    Hypertension Brother    Diabetes Brother    Breast cancer Neg Hx    Colon cancer Neg Hx    Esophageal cancer Neg Hx    Stomach cancer Neg Hx    Rectal cancer Neg Hx     Past Surgical History:  Procedure Laterality Date   LEEP  2009   CIN I negative margin   SKIN CANCER EXCISION  2010   chest area    ROS: Review of Systems Negative except as stated above  PHYSICAL EXAM: BP 132/83   Pulse 77   Temp 98.1 F (36.7 C) (Oral)   Ht 5'  7" (1.702 m)   Wt (!) 305 lb (138.3 kg)   SpO2 95%   BMI 47.77 kg/m   Wt Readings from Last 3 Encounters:  07/14/23 (!) 305 lb (138.3 kg)  11/19/22 (!) 303 lb (137.4 kg)  03/11/22 297 lb 6.4 oz (134.9 kg)    Physical Exam  General appearance - alert, well appearing, obese middle age caucasian female and in no distress Mental status - normal mood, behavior, speech, dress, motor activity, and thought  processes Neck - supple, no significant adenopathy Chest - clear to auscultation, no wheezes, rales or rhonchi, symmetric air entry Heart - normal rate, regular rhythm, normal S1, S2, no murmurs, rubs, clicks or gallops Extremities - peripheral pulses normal, no pedal edema, no clubbing or cyanosis     07/14/2023   10:55 AM 11/19/2022    4:34 PM 03/11/2022    3:44 PM  Depression screen PHQ 2/9  Decreased Interest 3 1 1   Down, Depressed, Hopeless 2 1 1   PHQ - 2 Score 5 2 2   Altered sleeping 3 1 2   Tired, decreased energy 2 1 2   Change in appetite 2 1 1   Feeling bad or failure about yourself  2 1 1   Trouble concentrating 1 1 0  Moving slowly or fidgety/restless 0 0 0  Suicidal thoughts 0 0 0  PHQ-9 Score 15 7 8         Latest Ref Rng & Units 11/26/2022   10:33 AM 09/14/2021    4:25 PM 10/09/2020    4:57 PM  CMP  Glucose 70 - 99 mg/dL 657  846  962   BUN 6 - 24 mg/dL 11  10  13    Creatinine 0.57 - 1.00 mg/dL 9.52  8.41  3.24   Sodium 134 - 144 mmol/L 141  141  141   Potassium 3.5 - 5.2 mmol/L 4.1  4.2  3.9   Chloride 96 - 106 mmol/L 101  101  103   CO2 20 - 29 mmol/L 25  24  25    Calcium 8.7 - 10.2 mg/dL 9.2  40.1  9.6   Total Protein 6.0 - 8.5 g/dL 6.8  7.0  6.9   Total Bilirubin 0.0 - 1.2 mg/dL 0.2  0.2  <0.2   Alkaline Phos 44 - 121 IU/L 71  97  72   AST 0 - 40 IU/L 20  21  30    ALT 0 - 32 IU/L 17  24  31     Lipid Panel     Component Value Date/Time   CHOL 197 11/26/2022 1033   TRIG 176 (H) 11/26/2022 1033   HDL 56 11/26/2022 1033   CHOLHDL 3.5 11/26/2022 1033   CHOLHDL 4.0 12/14/2016 1421   VLDL 32 (H) 12/14/2016 1421   LDLCALC 110 (H) 11/26/2022 1033    CBC    Component Value Date/Time   WBC 4.8 11/26/2022 1033   WBC 6.1 03/25/2020 1609   RBC 4.25 11/26/2022 1033   RBC 4.09 03/25/2020 1609   HGB 13.1 11/26/2022 1033   HCT 40.0 11/26/2022 1033   PLT 247 11/26/2022 1033   MCV 94 11/26/2022 1033   MCH 30.8 11/26/2022 1033   MCH 30.8 03/25/2020 1609    MCHC 32.8 11/26/2022 1033   MCHC 31.8 03/25/2020 1609   RDW 12.5 11/26/2022 1033   LYMPHSABS 3.0 09/29/2017 1544   MONOABS 0.6 10/11/2016 0745   EOSABS 0.2 09/29/2017 1544   BASOSABS 0.0 09/29/2017 1544    ASSESSMENT AND PLAN:  1. Type 2 diabetes mellitus with morbid obesity (HCC) A1c is not at goal.  Encourage healthy eating habits.  She plans to join [employer] which I think is a good thing. Encouraged her to try to move more.  Will refer her to cardiology for workup of her chest pains first.  Continue Amaryl 2 mg daily.  Was on Trulicity in the past but she self stopped because she did not find it helpful. - POCT glycosylated hemoglobin (Hb A1C) - POCT glucose (manual entry) - Hepatic Function Panel  2. Diabetes mellitus treated with oral medication (HCC) See #1 above.  3. Hypertension associated with type 2 diabetes mellitus (HCC) Close to goal. Continue hydralazine 10 mg twice a day, Cozaar 100 mg daily and carvedilol 3.125 mg BID.  4. Hyperlipidemia associated with type 2 diabetes mellitus (HCC) Continue atorvastatin 20 mg daily  5. Major depressive disorder, recurrent episode, moderate (HCC) Continue Lexapro 20 mg daily. She feels she would benefit from some counseling.  Referral submitted. - Ambulatory referral to Psychiatry  6. Chest pain in adult Chest pain history concerning and she does have risk factors.  I recommend taking a baby aspirin daily.  Continue carvedilol and Lipitor.  Will get her in with cardiology. - Ambulatory referral to Cardiology  7. Menopausal syndrome (hot flashes) Recommend turning down the temperature with her air conditioning at nights. -Discussed management of hot flashes associated with being perimenopausal.  Not the best candidate for HRT given risk factors for cardiovascular events.  If we do have to go with HRT, we can consider estrogen patch with oral progesterone.  We discussed other options including trying a higher dose or  increased frequency of gabapentin or the newer medication called Veozah.  Went over with her how it works, the fact that it is nonhormonal, most common side effect is headache and the need to check LFTs at baseline and then again in 3 months in 6 months.  She would like to try the [facility]. - Fezolinetant (VEOZAH) 45 MG TABS; Take 1 tablet (45 mg total) by mouth daily.  Dispense: 30 tablet; Refill: 4  8. Personal history of malignant melanoma of skin No signs of recurrence at this time.  9. Screening for colon cancer We will request new Cologuard kit for her. - Cologuard    Patient was given the opportunity to ask questions.  Patient verbalized understanding of the plan and was able to repeat key elements of the plan.   This documentation was completed using Paediatric nurse.  Any transcriptional errors are unintentional.  Orders Placed This Encounter  Procedures   Cologuard   Hepatic Function Panel   Ambulatory referral to Cardiology   Ambulatory referral to Psychiatry   POCT glycosylated hemoglobin (Hb A1C)   POCT glucose (manual entry)     Requested Prescriptions   Signed Prescriptions Disp Refills   Fezolinetant (VEOZAH) 45 MG TABS 30 tablet 4    Sig: Take 1 tablet (45 mg total) by mouth daily.    Return in about 4 months (around 11/13/2023).  Jonah Blue, MD, FACP

## 2023-07-14 NOTE — Patient Instructions (Signed)
Fezolinetant Tablets What is this medication? FEZOLINETANT (FEZ oh LIN e tant) reduces the number and severity of hot flashes due to menopause. It works by blocking substances in your body that cause hot flashes and night sweats. This medicine may be used for other purposes; ask your health care provider or pharmacist if you have questions. COMMON BRAND NAME(S): VEOZAH What should I tell my care team before I take this medication? They need to know if you have any of these conditions: Kidney disease Liver disease An unusual or allergic reaction to fezolinetant, other medications, foods, dyes, or preservatives Pregnant or trying to get pregnant Breastfeeding How should I use this medication? Take this medication by mouth with water. Take it as directed on the prescription label at the same time every day. Do not cut, crush, or chew this medication. Swallow the tablets whole. You can take it with or without food. If it upsets your stomach, take it with food. Keep taking it unless your care team tells you to stop. Talk to your care team about the use of this medication in children. Special care may be needed. Overdosage: If you think you have taken too much of this medicine contact a poison control center or emergency room at once. NOTE: This medicine is only for you. Do not share this medicine with others. What if I miss a dose? If you miss a dose, take it as soon as you can unless it is more than 12 hours late. If it is more than 12 hours late, skip the missed dose. Take the next dose at the normal time. What may interact with this medication? Other medications may affect the way this medication works. Talk with your care team about all of the medications you take. They may suggest changes to your treatment plan to lower the risk of side effects and to make sure your medications work as intended. This list may not describe all possible interactions. Give your health care provider a list of all  the medicines, herbs, non-prescription drugs, or dietary supplements you use. Also tell them if you smoke, drink alcohol, or use illegal drugs. Some items may interact with your medicine. What should I watch for while using this medication? Visit your care team for regular checks on your progress. Tell your care team if your symptoms do not start to get better or if they get worse. You may need blood work while taking this medication. What side effects may I notice from receiving this medication? Side effects that you should report to your care team as soon as possible: Allergic reactions--skin rash, itching, hives, swelling of the face, lips, tongue, or throat Liver injury--right upper belly pain, loss of appetite, nausea, light-colored stool, dark yellow or brown urine, yellowing skin or eyes, unusual weakness or fatigue Side effects that usually do not require medical attention (report these to your care team if they continue or are bothersome): Back pain Diarrhea Hot flashes Stomach pain Trouble sleeping This list may not describe all possible side effects. Call your doctor for medical advice about side effects. You may report side effects to FDA at 1-800-FDA-1088. Where should I keep my medication? Keep out of the reach of children and pets. Store at room temperature between 20 and 25 degrees C (68 and 77 degrees F). Get rid of any unused medication after the expiration date. To get rid of medications that are no longer needed or have expired: Take the medication to a medication take-back program. Check with   your pharmacy or law enforcement to find a location. If you cannot return the medication, check the label or package insert to see if the medication should be thrown out in the garbage or flushed down the toilet. If you are not sure, ask your care team. If it is safe to put it in the trash, take the medication out of the container. Mix the medication with cat litter, dirt, coffee  grounds, or other unwanted substance. Seal the mixture in a bag or container. Put it in the trash. NOTE: This sheet is a summary. It may not cover all possible information. If you have questions about this medicine, talk to your doctor, pharmacist, or health care provider.  2024 Elsevier/Gold Standard (2022-04-29 00:00:00)

## 2023-07-15 ENCOUNTER — Telehealth: Payer: Self-pay

## 2023-07-15 ENCOUNTER — Other Ambulatory Visit: Payer: Self-pay

## 2023-07-15 NOTE — Telephone Encounter (Signed)
A prior authorization request for Janet [name] was manually faxed today to [facility] today fax: [phone] phone: [phone]

## 2023-07-18 ENCOUNTER — Other Ambulatory Visit: Payer: Self-pay

## 2023-07-20 ENCOUNTER — Other Ambulatory Visit: Payer: Self-pay

## 2023-07-25 ENCOUNTER — Other Ambulatory Visit: Payer: Self-pay

## 2023-08-10 ENCOUNTER — Encounter: Payer: Self-pay | Admitting: Internal Medicine

## 2023-08-11 ENCOUNTER — Other Ambulatory Visit: Payer: Self-pay

## 2023-08-11 ENCOUNTER — Other Ambulatory Visit: Payer: Self-pay | Admitting: Internal Medicine

## 2023-08-11 DIAGNOSIS — Z1211 Encounter for screening for malignant neoplasm of colon: Secondary | ICD-10-CM

## 2023-08-11 DIAGNOSIS — E1142 Type 2 diabetes mellitus with diabetic polyneuropathy: Secondary | ICD-10-CM

## 2023-08-11 MED ORDER — GABAPENTIN 300 MG PO CAPS
300.0000 mg | ORAL_CAPSULE | Freq: Two times a day (BID) | ORAL | 1 refills | Status: DC
Start: 2023-08-11 — End: 2024-09-05
  Filled 2023-08-11 – 2023-08-15 (×2): qty 60, 30d supply, fill #0
  Filled 2023-12-06: qty 60, 30d supply, fill #1
  Filled 2024-01-17: qty 60, 30d supply, fill #2
  Filled 2024-04-15 – 2024-05-09 (×2): qty 60, 30d supply, fill #3
  Filled 2024-06-25: qty 60, 30d supply, fill #4

## 2023-08-15 ENCOUNTER — Other Ambulatory Visit: Payer: Self-pay

## 2023-08-17 ENCOUNTER — Encounter: Payer: Self-pay | Admitting: Internal Medicine

## 2023-08-19 ENCOUNTER — Other Ambulatory Visit: Payer: Self-pay

## 2023-08-24 ENCOUNTER — Other Ambulatory Visit: Payer: Self-pay

## 2023-09-07 NOTE — Progress Notes (Unsigned)
  Cardiology Office Note:  .   Date:  09/07/2023  ID:  Gabriel Earing, DOB [birth year], MRN [id number] PCP: Marcine Matar, MD  [facility] Providers Cardiologist:  None { Click to update primary MD,subspecialty MD or APP then REFRESH:1}   History of Present Illness: Marland Kitchen   Janet [name] is a 52 y.o. female HTN, OSA, well controlled T2DM, gout, OSA on CPAP, referral from her PCP for hx of chest pain. She was instructed to take aspirin. She's on lipitor as well and BB.  ROS:  per HPI otherwise negative   Studies Reviewed: .        *** Risk Assessment/Calculations:        Physical Exam:   VS:  There were no vitals taken for this visit.   Wt Readings from Last 3 Encounters:  07/14/23 (!) 305 lb (138.3 kg)  11/19/22 (!) 303 lb (137.4 kg)  03/11/22 297 lb 6.4 oz (134.9 kg)    GEN: Well nourished, well developed in no acute distress NECK: No JVD; No carotid bruits CARDIAC: ***RRR, no murmurs, rubs, gallops RESPIRATORY:  Clear to auscultation without rales, wheezing or rhonchi  ABDOMEN: Soft, non-tender, non-distended EXTREMITIES:  No edema; No deformity   ASSESSMENT AND PLAN: .   *** chest pain - symptoms are *** - CVD risk *** - recommend:      {Are you ordering a CV Procedure (e.g. stress test, cath, DCCV, TEE, etc)?   Press F2        :[id number]}  Dispo: ***  Signed, Maisie Fus, MD

## 2023-09-08 ENCOUNTER — Ambulatory Visit: Payer: 59 | Attending: Internal Medicine | Admitting: Internal Medicine

## 2023-09-08 ENCOUNTER — Encounter: Payer: Self-pay | Admitting: Internal Medicine

## 2023-09-08 VITALS — BP 140/110 | HR 79 | Ht 64.0 in | Wt 302.0 lb

## 2023-09-08 DIAGNOSIS — R079 Chest pain, unspecified: Secondary | ICD-10-CM | POA: Diagnosis not present

## 2023-09-08 NOTE — Patient Instructions (Signed)
  Follow-Up: At [facility], you and your health needs are our priority.  As part of our continuing mission to provide you with exceptional heart care, we have created designated Provider Care Teams.  These Care Teams include your primary Cardiologist (physician) and Advanced Practice Providers (APPs -  Physician Assistants and Nurse Practitioners) who all work together to provide you with the care you need, when you need it.  We recommend signing up for the patient portal called "MyChart".  Sign up information is provided on this After Visit Summary.  MyChart is used to connect with patients for Virtual Visits (Telemedicine).  Patients are able to view lab/test results, encounter notes, upcoming appointments, etc.  Non-urgent messages can be sent to your provider as well.   To learn more about what you can do with MyChart, go to [email or web address].    Your next appointment:   Follow up as needed     Provider:   Dr. Wyline Mood

## 2023-09-09 ENCOUNTER — Other Ambulatory Visit: Payer: Self-pay | Admitting: Internal Medicine

## 2023-09-09 DIAGNOSIS — Z1211 Encounter for screening for malignant neoplasm of colon: Secondary | ICD-10-CM

## 2023-09-09 DIAGNOSIS — Z1212 Encounter for screening for malignant neoplasm of rectum: Secondary | ICD-10-CM

## 2023-09-20 ENCOUNTER — Ambulatory Visit: Payer: 59 | Admitting: Internal Medicine

## 2023-10-07 ENCOUNTER — Encounter: Payer: Self-pay | Admitting: Internal Medicine

## 2023-10-20 ENCOUNTER — Ambulatory Visit
Admission: RE | Admit: 2023-10-20 | Discharge: 2023-10-20 | Disposition: A | Discharge status: Home / Self Care | Payer: 59 | Source: Ambulatory Visit | Attending: Internal Medicine | Admitting: Internal Medicine

## 2023-10-20 DIAGNOSIS — Z1231 Encounter for screening mammogram for malignant neoplasm of breast: Secondary | ICD-10-CM

## 2023-10-31 ENCOUNTER — Other Ambulatory Visit: Payer: Self-pay | Admitting: Internal Medicine

## 2023-10-31 MED ORDER — GLIMEPIRIDE 2 MG PO TABS
2.0000 mg | ORAL_TABLET | Freq: Every day | ORAL | 0 refills | Status: DC
  Filled 2023-10-31: qty 30, 30d supply, fill #0

## 2023-11-01 ENCOUNTER — Other Ambulatory Visit: Payer: Self-pay

## 2023-11-08 ENCOUNTER — Other Ambulatory Visit: Payer: Self-pay

## 2023-11-15 ENCOUNTER — Ambulatory Visit: Payer: 59 | Attending: Internal Medicine | Admitting: Internal Medicine

## 2023-11-15 ENCOUNTER — Encounter: Payer: Self-pay | Admitting: Internal Medicine

## 2023-11-15 DIAGNOSIS — E785 Hyperlipidemia, unspecified: Secondary | ICD-10-CM

## 2023-11-15 DIAGNOSIS — I152 Hypertension secondary to endocrine disorders: Secondary | ICD-10-CM

## 2023-11-15 DIAGNOSIS — E1169 Type 2 diabetes mellitus with other specified complication: Secondary | ICD-10-CM | POA: Diagnosis not present

## 2023-11-15 DIAGNOSIS — E1142 Type 2 diabetes mellitus with diabetic polyneuropathy: Secondary | ICD-10-CM

## 2023-11-15 DIAGNOSIS — Z7984 Long term (current) use of oral hypoglycemic drugs: Secondary | ICD-10-CM

## 2023-11-15 DIAGNOSIS — E1159 Type 2 diabetes mellitus with other circulatory complications: Secondary | ICD-10-CM | POA: Diagnosis not present

## 2023-11-15 DIAGNOSIS — E119 Type 2 diabetes mellitus without complications: Secondary | ICD-10-CM

## 2023-11-15 LAB — GLUCOSE, POCT (MANUAL RESULT ENTRY): POC Glucose: 151 mg/dL — AB (ref 70–99)

## 2023-11-15 LAB — POCT GLYCOSYLATED HEMOGLOBIN (HGB A1C): HbA1c, POC (controlled diabetic range): 8.6 % — AB (ref 0.0–7.0)

## 2023-11-15 MED ORDER — OZEMPIC (0.25 OR 0.5 MG/DOSE) 2 MG/3ML ~~LOC~~ SOPN
0.2500 mg | PEN_INJECTOR | SUBCUTANEOUS | 1 refills | Status: DC
  Filled 2023-11-15: qty 3, 28d supply, fill #0
  Filled 2023-12-29: qty 3, 28d supply, fill #1

## 2023-11-15 MED ORDER — HYDRALAZINE HCL 10 MG PO TABS
20.0000 mg | ORAL_TABLET | Freq: Two times a day (BID) | ORAL | 1 refills | Status: DC
  Filled 2023-11-15: qty 120, 30d supply, fill #0

## 2023-11-15 NOTE — Patient Instructions (Signed)
Start. Ozempic 0.25 mg once a week.  Please have the pharmacist show you how to administer the Ozempic.  Please stop the medicine if you develop any vomiting, abdominal pain with vomiting, abdominal pain, severe diarrhea or constipation.  Increase hydralazine to 20 mg twice a day.  I have sent an updated prescription to your pharmacy.

## 2023-11-15 NOTE — Progress Notes (Addendum)
Patient ID: Janet [name], female    DOB: 29-Jan-1971  MRN: [id number]  CC: Diabetes (DM f/u. /Jury letter form - neuropathy causing pain Dallie Piles received flu vax. No to Tdap vax)   Subjective: Janet [name] is a 52 y.o. female who presents for chronic ds management. Her concerns today include:  Pt with hx of DM with neuropathy ,HTN, HL, GERD, obesity, OSA, on CPAP, vit B12 def, vit D def, obesity, anx/dep, gout, psoriasis, melanoma, COVID-19 infection 03/2020   Discussed the use of AI scribe software for clinical note transcription with the patient, who gave verbal consent to proceed.  History of Present Illness   The patient, with a history of diabetes, hypertension, and hyperlipidemia, presents for a routine follow-up. Her HbA1c has increased from 7.2 to 8.6 over the past four months despite consistent use of glimepiride 2mg  daily. She attributes this increase to overeating during the holiday season and her birthday in [birth year]. She reports checking her blood sugars daily, with morning readings ranging from 119 to 127.  The patient also reports neuropathic symptoms, describing her feet as feeling like they're burning, especially when sitting for prolonged periods. She expresses concern about her ability to fulfill jury duty due to these symptoms and request letter of exemption.  On gabapentin for neuropathy.  HTN: Reports compliance with her blood pressure medications including hydralazine 10 mg twice a day, Cozaar 100 mg daily and carvedilol 3.125 mg twice a day.  Checks blood pressure about once a week.  States that readings are close to what it is today.  She does not have log with her. HL: She is taking and tolerating the atorvastatin 20 mg daily.  HM: Up-to-date with flu shot.  Still has Cologuard kit at home that she has not used and send in as yet.  She promises to do so.        Patient Active Problem List   Diagnosis Date Noted   Type 2 diabetes mellitus with morbid  obesity (HCC) 09/14/2021   Major depressive disorder, recurrent episode, moderate (HCC) 07/03/2021   Peripheral polyneuropathy 05/14/2021   COVID-19 virus infection 03/27/2020   Glaucoma suspect of both eyes 09/17/2019   Personal history of malignant melanoma of skin 09/17/2019   Prediabetes 02/19/2019   OSA (obstructive sleep apnea) 01/28/2019   Seborrheic dermatitis of scalp 06/15/2018   Psoriasis 02/14/2018   Vitamin B 12 deficiency 02/14/2018   Vitamin D deficiency 02/14/2018   Anxiety and depression 02/03/2015   Hyperlipemia 02/03/2015   Obesity 02/03/2015   Anemia due to chronic blood loss - reports history of evaluation and told due to menorrhagia 05/30/2014   Essential hypertension 08/22/2007   GERD 08/22/2007   ABFND PAP SMEAR HGSIL 08/22/2007     Current Outpatient Medications on File Prior to Visit  Medication Sig Dispense Refill   albuterol (PROVENTIL) (2.5 MG/3ML) 0.083% nebulizer solution Take 6 mLs (5 mg total) by nebulization every 4 (four) hours as needed for wheezing. 75 mL 0   albuterol (VENTOLIN HFA) 108 (90 Base) MCG/ACT inhaler Inhale 2 puffs into the lungs every 4 (four) hours as needed for wheezing or shortness of breath. 18 g 0   allopurinol (ZYLOPRIM) 100 MG tablet Take 1 tablet by mouth once daily 90 tablet 0   atorvastatin (LIPITOR) 20 MG tablet Take 1 tablet (20 mg total) by mouth daily. 90 tablet 1   Blood Glucose Monitoring Suppl (TRUE METRIX METER) w/Device KIT Use as directed 1 kit 0  carvedilol (COREG) 3.125 MG tablet Take 1 tablet (3.125 mg total) by mouth 2 (two) times daily with a meal. 180 tablet 0   Continuous Blood Gluc Receiver (DEXCOM G7 RECEIVER) DEVI Use as directed. 1 each 0   Continuous Blood Gluc Sensor (DEXCOM G7 SENSOR) MISC Change sensor every 10 days. 3 each 6   escitalopram (LEXAPRO) 20 MG tablet Take 1 tablet (20 mg total) by mouth daily. 30 tablet 4   Fezolinetant (VEOZAH) 45 MG TABS Take 1 tablet (45 mg total) by mouth daily. 30  tablet 4   gabapentin (NEURONTIN) 300 MG capsule Take 1 capsule (300 mg total) by mouth 2 (two) times daily. 180 capsule 1   glimepiride (AMARYL) 2 MG tablet Take 1 tablet (2 mg total) by mouth daily before breakfast. 30 tablet 0   glucose blood (TRUE METRIX BLOOD GLUCOSE TEST) test strip Use as instructed 100 each 0   losartan (COZAAR) 100 MG tablet Take 1 tablet (100 mg total) by mouth daily. 90 tablet 1   meloxicam (MOBIC) 15 MG tablet Take 1 tablet (15 mg total) by mouth daily. 30 tablet 0   mometasone (ELOCON) 0.1 % cream Apply 1 Application topically daily.     triamcinolone cream (KENALOG) 0.1 % Apply BID as needed to affected areas on extremities. 45 g 1   trimethoprim-polymyxin b (POLYTRIM) ophthalmic solution Place 1 drop into the right eye every 6 (six) hours. For 5-7 days 10 mL 0   TRUEplus Lancets 28G MISC USE AS DIRECTED 100 each 0   colchicine 0.6 MG tablet TAKE ONE TABLET TONIGHT. IF NO IMPROVEMENT IN PAIN AFTER ONE HOUR REPEAT DOSE. 30 tablet 4   furosemide (LASIX) 20 MG tablet TAKE 1 TABLET BY MOUTH AS NEEDED FOR SWELLING IN LEGS 30 tablet 3   Current Facility-Administered Medications on File Prior to Visit  Medication Dose Route Frequency Provider Last Rate Last Admin   cyanocobalamin ((VITAMIN B-12)) injection 1,000 mcg  1,000 mcg Intramuscular Q30 days Marcine Matar, MD   1,000 mcg at 09/28/21 1503    Allergies  Allergen Reactions   Lidocaine Hypertension    ? Lidocaine allergy? Reports heart racing when has "shot of medicine in cevix" for leep   Hydralazine Other (See Comments)    Edema in legs   Lisinopril-Hydrochlorothiazide Other (See Comments)    Leg cramps.   Metformin And Related Diarrhea   Norvasc [Amlodipine Besylate] Other (See Comments)    Leg pain   Penicillins Itching    Social History   Socioeconomic History   Marital status: Single    Spouse name: Not on file   Number of children: 1   Years of education: Not on file   Highest education  level: Associate degree: academic program  Occupational History   Not on file  Tobacco Use   Smoking status: Never   Smokeless tobacco: Never  Vaping Use   Vaping status: Never Used  Substance and Sexual Activity   Alcohol use: No   Drug use: No   Sexual activity: Not Currently    Partners: Male    Birth control/protection: None  Other Topics Concern   Not on file  Social History Narrative   Work or School: daycare in infant room      Home Situation: lives alone      Spiritual Beliefs: none      Lifestyle: no regular exercise; diet is poor            Social Determinants of  Health   Financial Resource Strain: Not on file  Food Insecurity: No Food Insecurity (11/15/2023)   Hunger Vital Sign    Worried About Running Out of Food in the Last Year: Never true    Ran Out of Food in the Last Year: Never true  Transportation Needs: No Transportation Needs (11/15/2023)   PRAPARE - Administrator, Civil Service (Medical): No    Lack of Transportation (Non-Medical): No  Physical Activity: Sufficiently Active (10/18/2017)   Exercise Vital Sign    Days of Exercise per Week: 5 days    Minutes of Exercise per Session: 60 min  Stress: Stress Concern Present (10/18/2017)   [employer] of Occupational Health - Occupational Stress Questionnaire    Feeling of Stress : Very much  Social Connections: Somewhat Isolated (10/18/2017)   Social Connection and Isolation Panel [NHANES]    Frequency of Communication with Friends and Family: More than three times a week    Frequency of Social Gatherings with Friends and Family: More than three times a week    Attends Religious Services: More than 4 times per year    Active Member of [employer] or Organizations: No    Attends Banker Meetings: Never    Marital Status: Divorced  Catering manager Violence: Not At Risk (11/15/2023)   Humiliation, Afraid, Rape, and Kick questionnaire    Fear of Current or Ex-Partner: No     Emotionally Abused: No    Physically Abused: No    Sexually Abused: No    Family History  Problem Relation Age of Onset   Diabetes Mother    Hypertension Mother    Fibroids Mother    Colon polyps Father    Heart disease Father    Hypertension Father    Heart attack Father    Cancer Father        liver cancer   Diabetes Brother    Hypertension Brother    Diabetes Brother    Breast cancer Neg Hx    Colon cancer Neg Hx    Esophageal cancer Neg Hx    Stomach cancer Neg Hx    Rectal cancer Neg Hx     Past Surgical History:  Procedure Laterality Date   LEEP  2009   CIN I negative margin   SKIN CANCER EXCISION  2010   chest area    ROS: Review of Systems Negative except as stated above  PHYSICAL EXAM: BP 121/83 (BP Location: Left Arm, Patient Position: Sitting, Cuff Size: Large)   Pulse 80   Temp (!) 97.5 F (36.4 C) (Oral)   Ht 5\' 4"  (1.626 m)   Wt (!) 303 lb (137.4 kg)   SpO2 96%   BMI 52.01 kg/m   Wt Readings from Last 3 Encounters:  11/15/23 (!) 303 lb (137.4 kg)  09/08/23 (!) 302 lb (137 kg)  07/14/23 (!) 305 lb (138.3 kg)    Physical Exam  General appearance - alert, well appearing, obese middle-age Caucasian female and in no distress Mental status - normal mood, behavior, speech, dress, motor activity, and thought processes Neck - supple, no significant adenopathy Chest - clear to auscultation, no wheezes, rales or rhonchi, symmetric air entry Heart - normal rate, regular rhythm, normal S1, S2, no murmurs, rubs, clicks or gallops Extremities - peripheral pulses normal, no pedal edema, no clubbing or cyanosis  Results for orders placed or performed in visit on 11/15/23  POCT glycosylated hemoglobin (Hb A1C)  Result Value Ref Range  Hemoglobin A1C     HbA1c POC (<> result, manual entry)     HbA1c, POC (prediabetic range)     HbA1c, POC (controlled diabetic range) 8.6 (A) 0.0 - 7.0 %  POCT glucose (manual entry)  Result Value Ref Range   POC  Glucose 151 (A) 70 - 99 mg/dl       Latest Ref Rng & Units 07/14/2023   11:56 AM 11/26/2022   10:33 AM 09/14/2021    4:25 PM  CMP  Glucose 70 - 99 mg/dL  308  657   BUN 6 - 24 mg/dL  11  10   Creatinine 8.46 - 1.00 mg/dL  9.62  9.52   Sodium 841 - 144 mmol/L  141  141   Potassium 3.5 - 5.2 mmol/L  4.1  4.2   Chloride 96 - 106 mmol/L  101  101   CO2 20 - 29 mmol/L  25  24   Calcium 8.7 - 10.2 mg/dL  9.2  32.4   Total Protein 6.0 - 8.5 g/dL 6.8  6.8  7.0   Total Bilirubin 0.0 - 1.2 mg/dL <4.0  0.2  0.2   Alkaline Phos 44 - 121 IU/L 89  71  97   AST 0 - 40 IU/L 22  20  21    ALT 0 - 32 IU/L 19  17  24     Lipid Panel     Component Value Date/Time   CHOL 197 11/26/2022 1033   TRIG 176 (H) 11/26/2022 1033   HDL 56 11/26/2022 1033   CHOLHDL 3.5 11/26/2022 1033   CHOLHDL 4.0 12/14/2016 1421   VLDL 32 (H) 12/14/2016 1421   LDLCALC 110 (H) 11/26/2022 1033    CBC    Component Value Date/Time   WBC 4.8 11/26/2022 1033   WBC 6.1 03/25/2020 1609   RBC 4.25 11/26/2022 1033   RBC 4.09 03/25/2020 1609   HGB 13.1 11/26/2022 1033   HCT 40.0 11/26/2022 1033   PLT 247 11/26/2022 1033   MCV 94 11/26/2022 1033   MCH 30.8 11/26/2022 1033   MCH 30.8 03/25/2020 1609   MCHC 32.8 11/26/2022 1033   MCHC 31.8 03/25/2020 1609   RDW 12.5 11/26/2022 1033   LYMPHSABS 3.0 09/29/2017 1544   MONOABS 0.6 10/11/2016 0745   EOSABS 0.2 09/29/2017 1544   BASOSABS 0.0 09/29/2017 1544    ASSESSMENT AND PLAN: 1. Type 2 diabetes mellitus with morbid obesity (HCC) Not at goal.  Dietary indiscretion playing a role. We discussed trying her with a GLP-1 agonist agent to help with lowering the A1c and to help bring about some weight loss.  She was on Trulicity in the past but it was cost prohibitive.  She now has insurance. Continue glimepiride 2 mg daily. Discussed starting Ozempic 0.25 mg once a week.  Went over how the medication works and possible side effects of the medications including nausea,  vomiting, pancreatitis, bowel blockage, severe diarrhea/constipation. Advised to stop the medication and call if she develops any vomiting, abdominal pain, abdominal pain with vomiting, severe diarrhea/constipation - POCT glycosylated hemoglobin (Hb A1C) - POCT glucose (manual entry) - CBC; Future - Comprehensive metabolic panel; Future - Lipid panel; Future - Semaglutide,0.25 or 0.5MG /DOS, (OZEMPIC, 0.25 OR 0.5 MG/DOSE,) 2 MG/3ML SOPN; Inject 0.25 mg into the skin once a week.  Dispense: 3 mL; Refill: 1 - Microalbumin / creatinine urine ratio; Future  2. Diabetes mellitus treated with oral medication (HCC) See #1 above  3. Hypertension associated with type  2 diabetes mellitus (HCC) Diastolic not at goal.  We will increase hydralazine to 20 mg twice a day.  Continue current doses of carvedilol and Cozaar. - hydrALAZINE (APRESOLINE) 10 MG tablet; Take 2 tablets (20 mg total) by mouth 2 (two) times daily.  Dispense: 360 tablet; Refill: 1  4. Hyperlipidemia associated with type 2 diabetes mellitus (HCC) Continue atorvastatin.  5. Diabetic peripheral neuropathy (HCC) She will continue gabapentin.  Letter written asking that she be excused from jury duty.     Patient was given the opportunity to ask questions.  Patient verbalized understanding of the plan and was able to repeat key elements of the plan.   This documentation was completed using Paediatric nurse.  Any transcriptional errors are unintentional.  Orders Placed This Encounter  Procedures   CBC   Comprehensive metabolic panel   Lipid panel   Microalbumin / creatinine urine ratio   POCT glycosylated hemoglobin (Hb A1C)   POCT glucose (manual entry)     Requested Prescriptions   Signed Prescriptions Disp Refills   Semaglutide,0.25 or 0.5MG /DOS, (OZEMPIC, 0.25 OR 0.5 MG/DOSE,) 2 MG/3ML SOPN 3 mL 1    Sig: Inject 0.25 mg into the skin once a week.   hydrALAZINE (APRESOLINE) 10 MG tablet 360 tablet 1     Sig: Take 2 tablets (20 mg total) by mouth 2 (two) times daily.    Return in about 4 months (around 03/15/2024) for Give lab appt for this week.  Jonah Blue, MD, FACP

## 2023-11-16 ENCOUNTER — Other Ambulatory Visit: Payer: Self-pay

## 2023-11-18 ENCOUNTER — Encounter: Payer: Self-pay | Admitting: Internal Medicine

## 2023-11-18 ENCOUNTER — Ambulatory Visit: Payer: 59 | Attending: Internal Medicine

## 2023-11-18 DIAGNOSIS — E1169 Type 2 diabetes mellitus with other specified complication: Secondary | ICD-10-CM | POA: Diagnosis not present

## 2023-11-20 LAB — MICROALBUMIN / CREATININE URINE RATIO
Creatinine, Urine: 182.6 mg/dL
Microalb/Creat Ratio: 268 mg/g{creat} — ABNORMAL HIGH (ref 0–29)
Microalbumin, Urine: 490 ug/mL

## 2023-11-20 LAB — LIPID PANEL
Chol/HDL Ratio: 2.6 {ratio} (ref 0.0–4.4)
Cholesterol, Total: 152 mg/dL (ref 100–199)
HDL: 59 mg/dL (ref >39)
LDL Chol Calc (NIH): 72 mg/dL (ref 0–99)
Triglycerides: 120 mg/dL (ref 0–149)
VLDL Cholesterol Cal: 21 mg/dL (ref 5–40)

## 2023-11-20 LAB — CBC
Hematocrit: 39.9 % (ref 34.0–46.6)
Hemoglobin: 12.8 g/dL (ref 11.1–15.9)
MCH: 29.5 pg (ref 26.6–33.0)
MCHC: 32.1 g/dL (ref 31.5–35.7)
MCV: 92 fL (ref 79–97)
Platelets: 351 10*3/uL (ref 150–450)
RBC: 4.34 x10E6/uL (ref 3.77–5.28)
RDW: 13.1 % (ref 11.7–15.4)
WBC: 8.7 10*3/uL (ref 3.4–10.8)

## 2023-11-20 LAB — COMPREHENSIVE METABOLIC PANEL
ALT: 22 [IU]/L (ref 0–32)
AST: 19 [IU]/L (ref 0–40)
Albumin: 4.2 g/dL (ref 3.8–4.9)
Alkaline Phosphatase: 106 [IU]/L (ref 44–121)
BUN/Creatinine Ratio: 13 (ref 9–23)
BUN: 9 mg/dL (ref 6–24)
Bilirubin Total: <0.2 mg/dL (ref 0.0–1.2)
CO2: 24 mmol/L (ref 20–29)
Calcium: 9.7 mg/dL (ref 8.7–10.2)
Chloride: 99 mmol/L (ref 96–106)
Creatinine, Ser: 0.69 mg/dL (ref 0.57–1.00)
Globulin, Total: 2.8 g/dL (ref 1.5–4.5)
Glucose: 215 mg/dL — ABNORMAL HIGH (ref 70–99)
Potassium: 4.4 mmol/L (ref 3.5–5.2)
Sodium: 140 mmol/L (ref 134–144)
Total Protein: 7 g/dL (ref 6.0–8.5)
eGFR: 105 mL/min/{1.73_m2} (ref >59)

## 2023-11-21 ENCOUNTER — Other Ambulatory Visit: Payer: Self-pay | Admitting: Internal Medicine

## 2023-11-21 DIAGNOSIS — E1169 Type 2 diabetes mellitus with other specified complication: Secondary | ICD-10-CM

## 2023-11-21 DIAGNOSIS — I152 Hypertension secondary to endocrine disorders: Secondary | ICD-10-CM

## 2023-11-21 MED ORDER — LOSARTAN POTASSIUM 100 MG PO TABS: 100.0000 mg | ORAL_TABLET | Freq: Every day | ORAL | 1 refills | Status: DC

## 2023-11-21 MED ORDER — HYDRALAZINE HCL 10 MG PO TABS: 20.0000 mg | ORAL_TABLET | Freq: Two times a day (BID) | ORAL | 1 refills | Status: DC

## 2023-11-21 MED ORDER — CARVEDILOL 3.125 MG PO TABS: 3.1250 mg | ORAL_TABLET | Freq: Two times a day (BID) | ORAL | 1 refills | Status: DC

## 2023-11-21 MED ORDER — ATORVASTATIN CALCIUM 20 MG PO TABS: 20.0000 mg | ORAL_TABLET | Freq: Every day | ORAL | 1 refills | Status: DC

## 2023-11-22 ENCOUNTER — Encounter: Payer: Self-pay | Admitting: Internal Medicine

## 2023-12-05 ENCOUNTER — Other Ambulatory Visit: Payer: Self-pay | Admitting: Internal Medicine

## 2023-12-05 MED ORDER — GLIMEPIRIDE 2 MG PO TABS
2.0000 mg | ORAL_TABLET | Freq: Every day | ORAL | 1 refills | Status: DC
  Filled 2023-12-05: qty 30, 30d supply, fill #0
  Filled 2024-01-02: qty 30, 30d supply, fill #1

## 2023-12-06 ENCOUNTER — Other Ambulatory Visit: Payer: Self-pay

## 2023-12-07 ENCOUNTER — Other Ambulatory Visit: Payer: Self-pay | Admitting: Internal Medicine

## 2023-12-07 DIAGNOSIS — Z8739 Personal history of other diseases of the musculoskeletal system and connective tissue: Secondary | ICD-10-CM

## 2023-12-09 ENCOUNTER — Other Ambulatory Visit: Payer: Self-pay

## 2023-12-12 ENCOUNTER — Encounter: Payer: Self-pay | Admitting: Internal Medicine

## 2023-12-19 ENCOUNTER — Telehealth: Payer: Self-pay | Admitting: Internal Medicine

## 2023-12-19 NOTE — Telephone Encounter (Signed)
Letter sent to patient's MyChart 

## 2023-12-30 ENCOUNTER — Other Ambulatory Visit: Payer: Self-pay

## 2024-01-02 ENCOUNTER — Other Ambulatory Visit: Payer: Self-pay

## 2024-01-05 ENCOUNTER — Other Ambulatory Visit: Payer: Self-pay

## 2024-01-15 ENCOUNTER — Encounter: Payer: Self-pay | Admitting: Internal Medicine

## 2024-01-15 ENCOUNTER — Other Ambulatory Visit: Payer: Self-pay | Admitting: Internal Medicine

## 2024-01-15 DIAGNOSIS — F331 Major depressive disorder, recurrent, moderate: Secondary | ICD-10-CM

## 2024-01-16 ENCOUNTER — Other Ambulatory Visit: Payer: Self-pay | Admitting: Internal Medicine

## 2024-01-16 DIAGNOSIS — I152 Hypertension secondary to endocrine disorders: Secondary | ICD-10-CM

## 2024-01-16 MED ORDER — HYDRALAZINE HCL 10 MG PO TABS: 20.0000 mg | ORAL_TABLET | Freq: Two times a day (BID) | ORAL | 4 refills | Status: DC

## 2024-01-19 ENCOUNTER — Other Ambulatory Visit: Payer: Self-pay | Admitting: Internal Medicine

## 2024-01-19 ENCOUNTER — Other Ambulatory Visit: Payer: Self-pay | Admitting: *Deleted

## 2024-01-19 ENCOUNTER — Other Ambulatory Visit: Payer: Self-pay

## 2024-01-19 DIAGNOSIS — E1169 Type 2 diabetes mellitus with other specified complication: Secondary | ICD-10-CM

## 2024-01-19 DIAGNOSIS — E1159 Type 2 diabetes mellitus with other circulatory complications: Secondary | ICD-10-CM

## 2024-01-19 DIAGNOSIS — Z8739 Personal history of other diseases of the musculoskeletal system and connective tissue: Secondary | ICD-10-CM

## 2024-01-19 DIAGNOSIS — F331 Major depressive disorder, recurrent, moderate: Secondary | ICD-10-CM

## 2024-01-19 MED ORDER — HYDRALAZINE HCL 10 MG PO TABS
20.0000 mg | ORAL_TABLET | Freq: Two times a day (BID) | ORAL | 4 refills | Status: DC
  Filled 2024-01-19 – 2024-02-02 (×2): qty 360, 90d supply, fill #0
  Filled 2024-08-29: qty 360, 90d supply, fill #1

## 2024-01-19 MED ORDER — ESCITALOPRAM OXALATE 20 MG PO TABS
20.0000 mg | ORAL_TABLET | Freq: Every day | ORAL | 1 refills | Status: DC
  Filled 2024-01-19 – 2024-01-22 (×2): qty 30, 30d supply, fill #0
  Filled 2024-02-08 – 2024-02-14 (×2): qty 30, 30d supply, fill #1
  Filled 2024-03-26: qty 30, 30d supply, fill #2
  Filled 2024-05-09: qty 30, 30d supply, fill #3

## 2024-01-19 MED ORDER — GLIMEPIRIDE 2 MG PO TABS
2.0000 mg | ORAL_TABLET | Freq: Every day | ORAL | 1 refills | Status: DC
  Filled 2024-01-19: qty 90, 90d supply, fill #0
  Filled 2024-05-09: qty 30, 30d supply, fill #0

## 2024-01-19 MED ORDER — CARVEDILOL 3.125 MG PO TABS
3.1250 mg | ORAL_TABLET | Freq: Two times a day (BID) | ORAL | 1 refills | Status: AC
  Filled 2024-01-19 – 2024-02-02 (×2): qty 180, 90d supply, fill #0
  Filled 2024-10-25: qty 60, 30d supply, fill #1

## 2024-01-19 MED ORDER — ALLOPURINOL 100 MG PO TABS
100.0000 mg | ORAL_TABLET | Freq: Every day | ORAL | 1 refills | Status: AC
  Filled 2024-01-19: qty 30, 30d supply, fill #0
  Filled 2024-02-02: qty 90, 90d supply, fill #0
  Filled 2024-10-25: qty 30, 30d supply, fill #1

## 2024-01-19 MED ORDER — LOSARTAN POTASSIUM 100 MG PO TABS
100.0000 mg | ORAL_TABLET | Freq: Every day | ORAL | 1 refills | Status: AC
  Filled 2024-01-19 – 2024-02-09 (×3): qty 90, 90d supply, fill #0
  Filled 2024-03-26: qty 30, 30d supply, fill #0
  Filled 2024-06-25: qty 30, 30d supply, fill #1
  Filled 2024-10-25: qty 30, 30d supply, fill #2

## 2024-01-19 MED ORDER — ATORVASTATIN CALCIUM 20 MG PO TABS
20.0000 mg | ORAL_TABLET | Freq: Every day | ORAL | 1 refills | Status: DC
Start: 2024-01-19 — End: 2024-10-25
  Filled 2024-01-19: qty 30, 30d supply, fill #0
  Filled 2024-02-02: qty 90, 90d supply, fill #0

## 2024-01-23 ENCOUNTER — Other Ambulatory Visit: Payer: Self-pay

## 2024-02-03 ENCOUNTER — Other Ambulatory Visit: Payer: Self-pay

## 2024-02-07 ENCOUNTER — Other Ambulatory Visit: Payer: Self-pay

## 2024-02-09 ENCOUNTER — Encounter: Payer: Self-pay | Admitting: Internal Medicine

## 2024-02-09 ENCOUNTER — Other Ambulatory Visit: Payer: Self-pay

## 2024-02-11 ENCOUNTER — Other Ambulatory Visit: Payer: Self-pay

## 2024-02-11 ENCOUNTER — Other Ambulatory Visit: Payer: Self-pay | Admitting: Internal Medicine

## 2024-02-11 MED ORDER — OZEMPIC (0.25 OR 0.5 MG/DOSE) 2 MG/3ML ~~LOC~~ SOPN
0.5000 mg | PEN_INJECTOR | SUBCUTANEOUS | 1 refills | Status: DC
  Filled 2024-02-11: qty 3, 28d supply, fill #0
  Filled 2024-03-26 – 2024-05-09 (×2): qty 3, 28d supply, fill #1

## 2024-02-13 ENCOUNTER — Other Ambulatory Visit: Payer: Self-pay

## 2024-02-14 ENCOUNTER — Other Ambulatory Visit: Payer: Self-pay

## 2024-02-17 ENCOUNTER — Other Ambulatory Visit: Payer: Self-pay

## 2024-03-12 ENCOUNTER — Encounter: Payer: Self-pay | Admitting: Internal Medicine

## 2024-03-14 ENCOUNTER — Other Ambulatory Visit: Payer: Self-pay

## 2024-03-14 ENCOUNTER — Other Ambulatory Visit: Payer: Self-pay | Admitting: Internal Medicine

## 2024-03-14 MED ORDER — CLOBETASOL PROPIONATE 0.05 % EX SOLN
1.0000 | Freq: Two times a day (BID) | CUTANEOUS | 0 refills | Status: AC
Start: 2024-03-14 — End: ?
  Filled 2024-03-14: qty 50, 25d supply, fill #0

## 2024-03-16 ENCOUNTER — Other Ambulatory Visit: Payer: Self-pay

## 2024-03-20 ENCOUNTER — Ambulatory Visit: Admitting: Internal Medicine

## 2024-03-20 ENCOUNTER — Ambulatory Visit: Payer: Self-pay

## 2024-03-20 NOTE — Telephone Encounter (Signed)
 noted

## 2024-03-20 NOTE — Telephone Encounter (Signed)
 Chief Complaint: dizziness Symptoms: moderate dizziness/vertigo/lightheaded not present at this time Frequency: intermittent Pertinent Negatives: Patient denies falls, headache, unilateral numbness or weakness, changes in speech or vision, chest pain, difficulty breathing, earaches, allergy or cold symptoms. Disposition: [] ED /[] Urgent Care (no appt availability in office) / [x] Appointment(In office/virtual)/ []  [facility]/ [] Home Care/ [] Refused Recommended Disposition /[] [facility]/ []  Follow-up with PCP Additional Notes: Patient states she thought yesterday it had improved, 2 nights ago when she went to lie down the room started spinning. She states yesterday morning she bent down to put on her shoes and was spinning, she tried to drive to work and was too dizzy so she had to pull over. She states the dizziness is not present at this time. No available appts with [facility] until 04/04/24. Patient agrees with disposition to be seen within 24 hours. Patient requesting afternoon appt today, scheduled with [facility]. Patient states she does not want to go to Urgent Care. Patient educated on stroke symptoms and verbalizes that she will call back for worsening of symptoms. Advised if she is feeling dizzy to have someone else drive her to her appt today.  Copied from CRM [id number]. Topic: Clinical - Red Word Triage >> Mar 20, 2024  8:44 AM Marlow [name] wrote: Red Word that prompted transfer to Nurse Triage: The patient called in stating she has had dizziness the last 2 nights going to bed. She doesn't experience it at during the day. The patient states she is nauseous at night going to bed as well when she is dizzy. I will trasnfer her to [facility] per providers request. Reason for Disposition  [1] MODERATE dizziness (e.g., vertigo; feels very unsteady, interferes with normal activities) AND [2] has NOT been evaluated by doctor (or NP/PA) for this  Answer Assessment - Initial Assessment  Questions 1. DESCRIPTION: "Describe your dizziness."     She states it feels like everything is spinning.  2. VERTIGO: "Do you feel like either you or the room is spinning or tilting?"      Yes.  3. LIGHTHEADED: "Do you feel lightheaded?" (e.g., somewhat faint, woozy, weak upon standing)     Yes.  4. SEVERITY: "How bad is it?"  "Can you walk?"   - MILD: Feels slightly dizzy and unsteady, but is walking normally.   - MODERATE: Feels unsteady when walking, but not falling; interferes with normal activities (e.g., school, work).   - SEVERE: Unable to walk without falling, or requires assistance to walk without falling.     Moderate.  5. ONSET:  "When did the dizziness begin?"     2 night ago. Sunday and Monday night.  6. AGGRAVATING FACTORS: "Does anything make it worse?" (e.g., standing, change in head position)     Lying down, turning head position.  7. CAUSE: "What do you think is causing the dizziness?"     She states not that she is aware of. She states she is on BP meds but does not check it.  8. RECURRENT SYMPTOM: "Have you had dizziness before?" If Yes, ask: "When was the last time?" "What happened that time?"     She states about 6 months ago she had the dizziness then as well. Resolved on its own.  9. OTHER SYMPTOMS: "Do you have any other symptoms?" (e.g., headache, weakness, numbness, vomiting, earache)     Denies.  10. PREGNANCY: "Is there any chance you are pregnant?" "When was your last menstrual period?"  N/A.  Protocols used: Dizziness - Vertigo-A-AH

## 2024-03-23 ENCOUNTER — Ambulatory Visit: Payer: 59 | Admitting: Internal Medicine

## 2024-03-27 ENCOUNTER — Other Ambulatory Visit: Payer: Self-pay

## 2024-03-29 LAB — HM DIABETES EYE EXAM

## 2024-04-02 ENCOUNTER — Other Ambulatory Visit: Payer: Self-pay

## 2024-04-12 ENCOUNTER — Other Ambulatory Visit: Payer: Self-pay

## 2024-04-13 ENCOUNTER — Ambulatory Visit: Admitting: Internal Medicine

## 2024-04-26 ENCOUNTER — Other Ambulatory Visit: Payer: Self-pay

## 2024-05-09 ENCOUNTER — Other Ambulatory Visit: Payer: Self-pay

## 2024-05-11 ENCOUNTER — Other Ambulatory Visit: Payer: Self-pay

## 2024-05-18 ENCOUNTER — Other Ambulatory Visit: Payer: Self-pay

## 2024-05-28 ENCOUNTER — Ambulatory Visit: Admitting: Orthopedic Surgery

## 2024-06-07 DIAGNOSIS — L400 Psoriasis vulgaris: Secondary | ICD-10-CM | POA: Diagnosis not present

## 2024-06-11 DIAGNOSIS — G4733 Obstructive sleep apnea (adult) (pediatric): Secondary | ICD-10-CM | POA: Diagnosis not present

## 2024-06-15 ENCOUNTER — Encounter: Payer: Self-pay | Admitting: Internal Medicine

## 2024-06-15 ENCOUNTER — Other Ambulatory Visit: Payer: Self-pay

## 2024-06-15 ENCOUNTER — Ambulatory Visit: Attending: Internal Medicine | Admitting: Internal Medicine

## 2024-06-15 DIAGNOSIS — Z7985 Long-term (current) use of injectable non-insulin antidiabetic drugs: Secondary | ICD-10-CM

## 2024-06-15 DIAGNOSIS — Z23 Encounter for immunization: Secondary | ICD-10-CM

## 2024-06-15 DIAGNOSIS — E1142 Type 2 diabetes mellitus with diabetic polyneuropathy: Secondary | ICD-10-CM | POA: Insufficient documentation

## 2024-06-15 DIAGNOSIS — E1169 Type 2 diabetes mellitus with other specified complication: Secondary | ICD-10-CM | POA: Diagnosis not present

## 2024-06-15 DIAGNOSIS — Z6841 Body Mass Index (BMI) 40.0 and over, adult: Secondary | ICD-10-CM | POA: Diagnosis not present

## 2024-06-15 DIAGNOSIS — E119 Type 2 diabetes mellitus without complications: Secondary | ICD-10-CM

## 2024-06-15 DIAGNOSIS — E785 Hyperlipidemia, unspecified: Secondary | ICD-10-CM

## 2024-06-15 DIAGNOSIS — Z7984 Long term (current) use of oral hypoglycemic drugs: Secondary | ICD-10-CM | POA: Diagnosis not present

## 2024-06-15 DIAGNOSIS — F330 Major depressive disorder, recurrent, mild: Secondary | ICD-10-CM

## 2024-06-15 DIAGNOSIS — E538 Deficiency of other specified B group vitamins: Secondary | ICD-10-CM | POA: Diagnosis not present

## 2024-06-15 DIAGNOSIS — N920 Excessive and frequent menstruation with regular cycle: Secondary | ICD-10-CM | POA: Diagnosis not present

## 2024-06-15 DIAGNOSIS — I10 Essential (primary) hypertension: Secondary | ICD-10-CM | POA: Diagnosis not present

## 2024-06-15 DIAGNOSIS — Z1211 Encounter for screening for malignant neoplasm of colon: Secondary | ICD-10-CM

## 2024-06-15 DIAGNOSIS — E1159 Type 2 diabetes mellitus with other circulatory complications: Secondary | ICD-10-CM

## 2024-06-15 LAB — GLUCOSE, POCT (MANUAL RESULT ENTRY): POC Glucose: 196 mg/dL — AB (ref 70–99)

## 2024-06-15 LAB — POCT GLYCOSYLATED HEMOGLOBIN (HGB A1C): HbA1c, POC (controlled diabetic range): 8.2 % — AB (ref 0.0–7.0)

## 2024-06-15 MED ORDER — ESCITALOPRAM OXALATE 20 MG PO TABS
20.0000 mg | ORAL_TABLET | Freq: Every day | ORAL | 1 refills | Status: AC
  Filled 2024-06-15: qty 30, 30d supply, fill #0
  Filled 2024-07-22: qty 30, 30d supply, fill #1
  Filled 2024-08-29: qty 30, 30d supply, fill #2
  Filled 2024-10-08: qty 30, 30d supply, fill #3
  Filled 2024-11-23 (×2): qty 30, 30d supply, fill #4

## 2024-06-15 MED ORDER — SEMAGLUTIDE (1 MG/DOSE) 4 MG/3ML ~~LOC~~ SOPN
1.0000 mg | PEN_INJECTOR | SUBCUTANEOUS | 0 refills | Status: DC
  Filled 2024-06-15: qty 3, 28d supply, fill #0

## 2024-06-15 NOTE — Patient Instructions (Signed)
 VISIT SUMMARY:  Today, you came in for a follow-up on your diabetes management. We discussed your current medications, blood pressure, cholesterol levels, menstrual cycles, sleep apnea, psoriasis, and overall health maintenance. We also talked about your fatigue and caregiving responsibilities.  YOUR PLAN:  -TYPE 2 DIABETES MELLITUS: Type 2 diabetes is a condition where your body does not use insulin properly, leading to high blood sugar levels. Your A1c has slightly improved to 8.4%. We will increase your Ozempic  dose to 1 mg weekly and continue your glimepiride  at 2 mg daily. Please send a MyChart message after one month on the new dose to discuss if an increase to 2 mg is needed. Keep working on [employer] and exercise.  -MENORRHAGIA: Menorrhagia is heavy and prolonged menstrual bleeding. Given your history of uterine polyps and a small fibroid, we will refer you to [facility] for second opinion and management. You mentioned considering a hysterectomy and preferring a female gynecologist.  -HYPERTENSION: Hypertension is high blood pressure. Your blood pressure is well-controlled with your current medications. Continue taking your medications as prescribed and monitor your blood pressure at home intermittently.  -DYSLIPIDEMIA: Dyslipidemia is having abnormal levels of cholesterol in your blood. Your LDL cholesterol is at 72 mg/dL, which is close to the target. Continue taking atorvastatin  20 mg daily.  -OBSTRUCTIVE SLEEP APNEA: Obstructive sleep apnea is a condition where your breathing stops and starts during sleep. You are using your CPAP machine consistently, which is good. Continue with your CPAP therapy.  -PSORIASIS: Psoriasis is a skin condition that causes red, itchy, and scaly patches. You have been prescribed shampoos and creams. Follow up with your dermatologist in six weeks for re-evaluation and attend your scheduled full body skin cancer screening in August.  -DEPRESSION:  Depression is a mood disorder causing persistent feelings of sadness and loss of interest. You are partially managing it with Lexapro . Continue taking Lexapro  as prescribed. We have refilled your prescription.  -GENERAL HEALTH MAINTENANCE: You are due for a colon cancer screening and pneumonia vaccination. We will refer you for a colonoscopy with [facility] and administer the pneumonia vaccine. We will also start the hepatitis B vaccine series and schedule a follow-up for the second dose in one month.  INSTRUCTIONS:  Please send a MyChart message after one month on the increased Ozempic  dose to discuss if an increase to 2 mg is needed. Follow up with [facility] for your menstrual issues. Continue monitoring your blood pressure at home. Follow up with your dermatologist in six weeks and attend your full body skin cancer screening in August. We will refer you for a colonoscopy with [facility], administer the pneumonia vaccine, and start the hepatitis B vaccine series. Schedule a follow-up for the second dose of the hepatitis B vaccine in one month.

## 2024-06-15 NOTE — Progress Notes (Signed)
 Patient ID: Janet [name], female    DOB: 1971/09/05  MRN: [id number]  CC: Diabetes (DM f/u. Med refill./No questions / concerns/Yes to pneumonia & Hep B vax )   Subjective: Janet [name] is a 53 y.o. female who presents for chronic ds management. Her concerns today include:  Pt with hx of DM with neuropathy ,HTN, HL, GERD, obesity, OSA, on CPAP, vit B12 def, vit D def, obesity, anx/dep, gout, psoriasis, melanoma, COVID-19 infection 03/2020   Discussed the use of AI scribe software for clinical note transcription with the patient, who gave verbal consent to proceed.  History of Present Illness Janet [name] is a 53 year old female with diabetes who presents for follow-up on her diabetes management.  DM: Results for orders placed or performed in visit on 06/15/24  POCT glucose (manual entry)   Collection Time: 06/15/24  4:28 PM  Result Value Ref Range   POC Glucose 196 (A) 70 - 99 mg/dl  POCT glycosylated hemoglobin (Hb A1C)   Collection Time: 06/15/24  4:32 PM  Result Value Ref Range   Hemoglobin A1C     HbA1c POC (<> result, manual entry)     HbA1c, POC (prediabetic range)     HbA1c, POC (controlled diabetic range) 8.2 (A) 0.0 - 7.0 %  Her last A1c in December was 8.6, and it has slightly decreased to 8.4. She is currently on glimepiride  2 mg daily and Ozempic  0.5 mg weekly, which she tolerates well without vomiting, though there is no significant decrease in appetite or change in wgh. She is trying to improve her eating habits by avoiding sugary drinks and eating smaller portions. She attempts to walk daily for about 15 minutes, especially at work. On weekends, she reports sleeping much of the day and feeling very tired.  She reports irregular and heavy menstrual cycles, with a three-week period in June that was heavy and included clotting. In May, she had a lighter period. She has a history of a small fibroid noted in a 2015 ultrasound and uterine polyps identified  last year by her GYN at [facility].  She states that her gynecologist recommended a D&C and another procedure to remove the polyps but patient is wanting to have a hysterectomy.  She is 48 and she is tired of dealing with heavy menstrual cycles.  She would like to be referred to a different gynecologist for second opinion.  HTN: Her blood pressure is managed with hydralazine  20 mg twice daily, Cozaar  100 mg once daily, and carvedilol  3.125 mg twice daily.  Reports compliance with medications.  She checks her blood pressure intermittently, and it has been stable. No chest pain.  For hyperlipidemia, she is on atorvastatin  20 mg, with her last LDL cholesterol recorded at 72 in December.  She experiences fatigue, particularly on weekends. She reports that she cares for her mother every day after work and one day on the weekend. She uses a CPAP machine consistently and has no daytime sleepiness at work.  Continues to take Lexapro  for depression which she finds helpful.  She reports that she is always depressed but it is much better since being on Lexapro .  She has a history of psoriasis, for which she was recently prescribed two shampoos, a cream, and a topical solution by [facility]. She plans to follow up with dermatology in six weeks.  She has a history of B12 deficiency but is not currently taking supplements. She reports significant fatigue and is  considering checking her B12 levels.  HM: She is due for pneumonia vaccine and hepatitis B vaccine series.  She is agreeable to receiving both today.  We are currently out of the hepatitis B vaccine.    Patient Active Problem List   Diagnosis Date Noted   Type 2 diabetes mellitus with morbid obesity (HCC) 09/14/2021   Major depressive disorder, recurrent episode, moderate (HCC) 07/03/2021   Peripheral polyneuropathy 05/14/2021   COVID-19 virus infection 03/27/2020   Glaucoma suspect of both eyes 09/17/2019   Personal history of  malignant melanoma of skin 09/17/2019   Prediabetes 02/19/2019   OSA (obstructive sleep apnea) 01/28/2019   Seborrheic dermatitis of scalp 06/15/2018   Psoriasis 02/14/2018   Vitamin B 12 deficiency 02/14/2018   Vitamin D  deficiency 02/14/2018   Anxiety and depression 02/03/2015   Hyperlipemia 02/03/2015   Obesity 02/03/2015   Anemia due to chronic blood loss - reports history of evaluation and told due to menorrhagia 05/30/2014   Essential hypertension 08/22/2007   GERD 08/22/2007   ABFND PAP SMEAR HGSIL 08/22/2007     Current Outpatient Medications on File Prior to Visit  Medication Sig Dispense Refill   allopurinol  (ZYLOPRIM ) 100 MG tablet Take 1 tablet (100 mg total) by mouth daily. 90 tablet 1   atorvastatin  (LIPITOR) 20 MG tablet Take 1 tablet (20 mg total) by mouth daily. 90 tablet 1   Blood Glucose Monitoring Suppl (TRUE METRIX METER) w/Device KIT Use as directed 1 kit 0   carvedilol  (COREG ) 3.125 MG tablet Take 1 tablet (3.125 mg total) by mouth 2 (two) times daily with a meal. 180 tablet 1   clobetasol  (TEMOVATE ) 0.05 % external solution Apply 1 Application topically 2 (two) times daily. Apply for up to 2 weeks then as needed. 50 mL 0   Continuous Blood Gluc Sensor (DEXCOM G7 SENSOR) MISC Change sensor every 10 days. 3 each 6   escitalopram  (LEXAPRO ) 20 MG tablet Take 1 tablet (20 mg total) by mouth daily. 90 tablet 1   Fezolinetant  (VEOZAH ) 45 MG TABS Take 1 tablet (45 mg total) by mouth daily. 30 tablet 4   gabapentin  (NEURONTIN ) 300 MG capsule Take 1 capsule (300 mg total) by mouth 2 (two) times daily. 180 capsule 1   glimepiride  (AMARYL ) 2 MG tablet Take 1 tablet (2 mg total) by mouth daily before breakfast. 90 tablet 1   glucose blood (TRUE METRIX BLOOD GLUCOSE TEST) test strip Use as instructed 100 each 0   hydrALAZINE  (APRESOLINE ) 10 MG tablet Take 2 tablets (20 mg total) by mouth 2 (two) times daily. 360 tablet 4   losartan  (COZAAR ) 100 MG tablet Take 1 tablet (100 mg  total) by mouth daily. 90 tablet 1   meloxicam  (MOBIC ) 15 MG tablet Take 1 tablet (15 mg total) by mouth daily. 30 tablet 0   mometasone (ELOCON) 0.1 % cream Apply 1 Application topically daily.     Semaglutide ,0.25 or 0.5MG /DOS, (OZEMPIC , 0.25 OR 0.5 MG/DOSE,) 2 MG/3ML SOPN Inject 0.5 mg into the skin once a week. 3 mL 1   triamcinolone  cream (KENALOG ) 0.1 % Apply BID as needed to affected areas on extremities. 45 g 1   TRUEplus Lancets 28G MISC USE AS DIRECTED 100 each 0   albuterol  (PROVENTIL ) (2.5 MG/3ML) 0.083% nebulizer solution Take 6 mLs (5 mg total) by nebulization every 4 (four) hours as needed for wheezing. (Patient not taking: Reported on 06/15/2024) 75 mL 0   albuterol  (VENTOLIN  HFA) 108 (90 Base) MCG/ACT inhaler Inhale  2 puffs into the lungs every 4 (four) hours as needed for wheezing or shortness of breath. (Patient not taking: Reported on 06/15/2024) 18 g 0   colchicine  0.6 MG tablet TAKE ONE TABLET TONIGHT. IF NO IMPROVEMENT IN PAIN AFTER ONE HOUR REPEAT DOSE. (Patient not taking: Reported on 06/15/2024) 30 tablet 4   Continuous Blood Gluc Receiver (DEXCOM G7 RECEIVER) DEVI Use as directed. (Patient not taking: Reported on 06/15/2024) 1 each 0   furosemide  (LASIX ) 20 MG tablet TAKE 1 TABLET BY MOUTH AS NEEDED FOR SWELLING IN LEGS (Patient not taking: Reported on 06/15/2024) 30 tablet 3   trimethoprim -polymyxin b  (POLYTRIM ) ophthalmic solution Place 1 drop into the right eye every 6 (six) hours. For 5-7 days (Patient not taking: Reported on 06/15/2024) 10 mL 0   Current Facility-Administered Medications on File Prior to Visit  Medication Dose Route Frequency Provider Last Rate Last Admin   cyanocobalamin  ((VITAMIN B-12)) injection 1,000 mcg  1,000 mcg Intramuscular Q30 days Vicci Barnie NOVAK, MD   1,000 mcg at 09/28/21 1503    Allergies  Allergen Reactions   Lidocaine Hypertension    ? Lidocaine allergy? Reports heart racing when has shot of medicine in cevix for leep   Hydralazine   Other (See Comments)    Edema in legs   Lisinopril -Hydrochlorothiazide  Other (See Comments)    Leg cramps.   Metformin  And Related Diarrhea   Norvasc  [Amlodipine  Besylate] Other (See Comments)    Leg pain   Penicillins Itching    Social History   Socioeconomic History   Marital status: Single    Spouse name: Not on file   Number of children: 1   Years of education: Not on file   Highest education level: Associate degree: academic program  Occupational History   Not on file  Tobacco Use   Smoking status: Never   Smokeless tobacco: Never  Vaping Use   Vaping status: Never Used  Substance and Sexual Activity   Alcohol use: No   Drug use: No   Sexual activity: Not Currently    Partners: Male    Birth control/protection: None  Other Topics Concern   Not on file  Social History Narrative   Work or School: daycare in infant room      Home Situation: lives alone      Spiritual Beliefs: none      Lifestyle: no regular exercise; diet is poor            Social Drivers of Corporate investment banker Strain: Low Risk  (06/15/2024)   Overall Financial Resource Strain (CARDIA)    Difficulty of Paying Living Expenses: Not very hard  Food Insecurity: No Food Insecurity (06/15/2024)   Hunger Vital Sign    Worried About Running Out of Food in the Last Year: Never true    Ran Out of Food in the Last Year: Never true  Transportation Needs: No Transportation Needs (06/15/2024)   PRAPARE - Administrator, Civil Service (Medical): No    Lack of Transportation (Non-Medical): No  Physical Activity: Inactive (06/15/2024)   Exercise Vital Sign    Days of Exercise per Week: 0 days    Minutes of Exercise per Session: 0 min  Stress: No Stress Concern Present (06/15/2024)   [employer] of Occupational Health - Occupational Stress Questionnaire    Feeling of Stress: Only a little  Social Connections: Socially Isolated (06/15/2024)   Social Connection and Isolation Panel     Frequency of Communication  with Friends and Family: More than three times a week    Frequency of Social Gatherings with Friends and Family: More than three times a week    Attends Religious Services: Never    Database administrator or Organizations: No    Attends Banker Meetings: Never    Marital Status: Divorced  Catering manager Violence: Not At Risk (06/15/2024)   Humiliation, Afraid, Rape, and Kick questionnaire    Fear of Current or Ex-Partner: No    Emotionally Abused: No    Physically Abused: No    Sexually Abused: No    Family History  Problem Relation Age of Onset   Diabetes Mother    Hypertension Mother    Fibroids Mother    Colon polyps Father    Heart disease Father    Hypertension Father    Heart attack Father    Cancer Father        liver cancer   Diabetes Brother    Hypertension Brother    Diabetes Brother    Breast cancer Neg Hx    Colon cancer Neg Hx    Esophageal cancer Neg Hx    Stomach cancer Neg Hx    Rectal cancer Neg Hx     Past Surgical History:  Procedure Laterality Date   LEEP  2009   CIN I negative margin   SKIN CANCER EXCISION  2010   chest area    ROS: Review of Systems Negative except as stated above  PHYSICAL EXAM: BP 110/73 (BP Location: Left Arm, Patient Position: Sitting, Cuff Size: Large)   Pulse 81   Temp 98.1 F (36.7 C) (Oral)   Ht 5' 4 (1.626 m)   Wt (!) 303 lb (137.4 kg)   SpO2 95%   BMI 52.01 kg/m   Wt Readings from Last 3 Encounters:  06/15/24 (!) 303 lb (137.4 kg)  11/15/23 (!) 303 lb (137.4 kg)  09/08/23 (!) 302 lb (137 kg)    Physical Exam  General appearance - alert, well appearing, and in no distress Mental status - normal mood, behavior, speech, dress, motor activity, and thought processes Neck - supple, no significant adenopathy Chest - clear to auscultation, no wheezes, rales or rhonchi, symmetric air entry Heart - normal rate, regular rhythm, normal S1, S2, no murmurs, rubs, clicks  or gallops Extremities - peripheral pulses normal, no pedal edema, no clubbing or cyanosis Diabetic Foot Exam - Simple   Simple Foot Form Diabetic Foot exam was performed with the following findings: Yes 06/15/2024  5:58 PM  Visual Inspection See comments: Yes Sensation Testing See comments: Yes Pulse Check Posterior Tibialis and Dorsalis pulse intact bilaterally: Yes Comments Toenail on right big toe was partially broken off.  She has decreased sensation on leap exam on the plantar surface of both feet.  No ulcers.          Latest Ref Rng & Units 11/18/2023    4:27 PM 07/14/2023   11:56 AM 11/26/2022   10:33 AM  CMP  Glucose 70 - 99 mg/dL 784   856   BUN 6 - 24 mg/dL 9   11   Creatinine 9.42 - 1.00 mg/dL 9.30   9.29   Sodium 865 - 144 mmol/L 140   141   Potassium 3.5 - 5.2 mmol/L 4.4   4.1   Chloride 96 - 106 mmol/L 99   101   CO2 20 - 29 mmol/L 24   25   Calcium  8.7 -  10.2 mg/dL 9.7   9.2   Total Protein 6.0 - 8.5 g/dL 7.0  6.8  6.8   Total Bilirubin 0.0 - 1.2 mg/dL <9.7  <9.7  0.2   Alkaline Phos 44 - 121 IU/L 106  89  71   AST 0 - 40 IU/L 19  22  20    ALT 0 - 32 IU/L 22  19  17     Lipid Panel     Component Value Date/Time   CHOL 152 11/18/2023 1627   TRIG 120 11/18/2023 1627   HDL 59 11/18/2023 1627   CHOLHDL 2.6 11/18/2023 1627   CHOLHDL 4.0 12/14/2016 1421   VLDL 32 (H) 12/14/2016 1421   LDLCALC 72 11/18/2023 1627    CBC    Component Value Date/Time   WBC 8.7 11/18/2023 1627   WBC 6.1 03/25/2020 1609   RBC 4.34 11/18/2023 1627   RBC 4.09 03/25/2020 1609   HGB 12.8 11/18/2023 1627   HCT 39.9 11/18/2023 1627   PLT 351 11/18/2023 1627   MCV 92 11/18/2023 1627   MCH 29.5 11/18/2023 1627   MCH 30.8 03/25/2020 1609   MCHC 32.1 11/18/2023 1627   MCHC 31.8 03/25/2020 1609   RDW 13.1 11/18/2023 1627   LYMPHSABS 3.0 09/29/2017 1544   MONOABS 0.6 10/11/2016 0745   EOSABS 0.2 09/29/2017 1544   BASOSABS 0.0 09/29/2017 1544    ASSESSMENT AND PLAN: 1. Type  2 diabetes mellitus with morbid obesity (HCC) (Primary) A1c is not at goal.  She has not achieved any significant weight loss so far with Ozempic .  We discussed increasing the dose to 1 mg once a week.  Continue Amaryl .  Healthy eating habits encouraged.  Encouraged her to continue to move as much as she can. - POCT glycosylated hemoglobin (Hb A1C) - POCT glucose (manual entry) - Semaglutide , 1 MG/DOSE, 4 MG/3ML SOPN; Inject 1 mg as directed once a week.  Dispense: 3 mL; Refill: 0  2. Long-term (current) use of injectable non-insulin antidiabetic drugs 3. Diabetes mellitus treated with oral medication (HCC) See #1 above  4. Diabetic peripheral neuropathy (HCC) Good feet care is important.  5. Major depressive disorder, recurrent episode, mild (HCC) Doing okay on Lexapro .  Refill sent. - escitalopram  (LEXAPRO ) 20 MG tablet; Take 1 tablet (20 mg total) by mouth daily.  Dispense: 90 tablet; Refill: 1  6. Menorrhagia with regular cycle I have submitted the referral to gynecology for her to get a second opinion per her request. - Ambulatory referral to Gynecology - CBC  7. B12 deficiency - Vitamin B12  8. Hyperlipidemia associated with type 2 diabetes mellitus (HCC) Last LDL was close to goal.  Continue atorvastatin  20 mg daily  9. Hypertension associated with type 2 diabetes mellitus (HCC) At goal.  Continue hydralazine  20 mg twice a day, Cozaar  100 mg daily and carvedilol  3.125 mg twice a day.  10. Screening for colon cancer - Ambulatory referral to Gastroenterology  11. Need for vaccination against Streptococcus pneumoniae - Pneumococcal conjugate vaccine 20-valent    Patient was given the opportunity to ask questions.  Patient verbalized understanding of the plan and was able to repeat key elements of the plan.   This documentation was completed using Paediatric nurse.  Any transcriptional errors are unintentional.  Orders Placed This Encounter   Procedures   POCT glycosylated hemoglobin (Hb A1C)   POCT glucose (manual entry)     Requested Prescriptions   Pending Prescriptions Disp Refills   escitalopram  (LEXAPRO )  20 MG tablet 90 tablet 1    Sig: Take 1 tablet (20 mg total) by mouth daily.    No follow-ups on file.  Barnie Louder, MD, FACP

## 2024-06-16 ENCOUNTER — Ambulatory Visit: Payer: Self-pay | Admitting: Internal Medicine

## 2024-06-16 LAB — CBC
Hematocrit: 38 % (ref 34.0–46.6)
Hemoglobin: 12 g/dL (ref 11.1–15.9)
MCH: 28.2 pg (ref 26.6–33.0)
MCHC: 31.6 g/dL (ref 31.5–35.7)
MCV: 89 fL (ref 79–97)
Platelets: 371 x10E3/uL (ref 150–450)
RBC: 4.25 x10E6/uL (ref 3.77–5.28)
RDW: 13.9 % (ref 11.7–15.4)
WBC: 8.7 x10E3/uL (ref 3.4–10.8)

## 2024-06-16 LAB — VITAMIN B12: Vitamin B-12: 768 pg/mL (ref 232–1245)

## 2024-06-25 ENCOUNTER — Other Ambulatory Visit: Payer: Self-pay

## 2024-06-29 ENCOUNTER — Other Ambulatory Visit: Payer: Self-pay

## 2024-07-03 ENCOUNTER — Other Ambulatory Visit: Payer: Self-pay

## 2024-07-04 ENCOUNTER — Ambulatory Visit: Admitting: Orthopedic Surgery

## 2024-07-12 ENCOUNTER — Encounter: Payer: Self-pay | Admitting: Internal Medicine

## 2024-07-16 ENCOUNTER — Ambulatory Visit

## 2024-07-16 ENCOUNTER — Encounter: Payer: Self-pay | Admitting: Internal Medicine

## 2024-07-16 ENCOUNTER — Telehealth: Payer: Self-pay | Admitting: Internal Medicine

## 2024-07-16 NOTE — Telephone Encounter (Signed)
 Contacted pt left vm to inform her we no longer provide the hep B vaccine she will have to go to the health dept

## 2024-07-19 ENCOUNTER — Ambulatory Visit: Attending: Internal Medicine | Admitting: Internal Medicine

## 2024-07-19 DIAGNOSIS — Z538 Procedure and treatment not carried out for other reasons: Secondary | ICD-10-CM

## 2024-07-19 NOTE — Progress Notes (Signed)
 Pt had appt for 8:10 a.m via video. I logged on at 8:14 a.m and I noted pt had just logged off. I called pt and LVMM asking her to log back on. Pt sent Mychart message stating she was at work and had to log off to get back to her duties.  She requested that we try to return her on her lunch break between 11 AM and 12 PM or after 4 PM.  I replied to her voicemail message letting her know that we will try provide and I am done seeing patients at these times. I will have my CMA reach out to see if she wants to reschedule.

## 2024-07-21 ENCOUNTER — Telehealth: Payer: Self-pay | Admitting: Internal Medicine

## 2024-07-21 NOTE — Telephone Encounter (Signed)
 Please see if pt wants to reschedule an early morning video visit to address depression.

## 2024-07-23 ENCOUNTER — Other Ambulatory Visit: Payer: Self-pay

## 2024-07-23 NOTE — Telephone Encounter (Signed)
 Called patient, patient did not answer. Left a detailed VM for patient to call back.

## 2024-07-24 NOTE — Telephone Encounter (Signed)
 Called but no answer. LVM to call back and schedule a virtual visit.

## 2024-07-25 NOTE — Telephone Encounter (Signed)
 Called but no answer. LVM to cal back and schedule a virtual visit.

## 2024-08-08 ENCOUNTER — Ambulatory Visit: Admitting: Orthopedic Surgery

## 2024-08-10 NOTE — Progress Notes (Deleted)
 GYNECOLOGY  VISIT   HPI: 53 y.o.   Single  Caucasian female   G1P1001 with No LMP recorded.   here for: New GYN Referral- Menorrhagia with regular cycle      GYNECOLOGIC HISTORY: No LMP recorded. Contraception:  none Menopausal hormone therapy:  n/a Last 2 paps:  06/03/21 neg HR HPV neg, 10/18/17 neg  History of abnormal Pap or positive HPV:  no Mammogram:  10/19/24 Breast Density Cat B, BIRADS Cat 1 neg         OB History     Gravida  1   Para  1   Term  1   Preterm      AB      Living  1      SAB      IAB      Ectopic      Multiple      Live Births  1              Patient Active Problem List   Diagnosis Date Noted   Diabetic peripheral neuropathy (HCC) 06/15/2024   Type 2 diabetes mellitus with morbid obesity (HCC) 09/14/2021   Major depressive disorder, recurrent episode, moderate (HCC) 07/03/2021   Peripheral polyneuropathy 05/14/2021   COVID-19 virus infection 03/27/2020   Glaucoma suspect of both eyes 09/17/2019   Personal history of malignant melanoma of skin 09/17/2019   Prediabetes 02/19/2019   OSA (obstructive sleep apnea) 01/28/2019   Seborrheic dermatitis of scalp 06/15/2018   Psoriasis 02/14/2018   Vitamin B 12 deficiency 02/14/2018   Vitamin D  deficiency 02/14/2018   Anxiety and depression 02/03/2015   Hyperlipemia 02/03/2015   Obesity 02/03/2015   Anemia due to chronic blood loss - reports history of evaluation and told due to menorrhagia 05/30/2014   Essential hypertension 08/22/2007   GERD 08/22/2007   ABFND PAP SMEAR HGSIL 08/22/2007    Past Medical History:  Diagnosis Date   ABFND PAP SMEAR HGSIL 08/22/2007   Anemia due to chronic blood loss - reports history of evaluation and told due to menorrhagia 05/30/2014   ANXIETY 08/22/2007   Anxiety and depression    Anxiety and depression    Diabetes 1.5, managed as type 2 (HCC)    Frequent headaches    GERD 08/22/2007   Hyperglycemia    Hyperlipemia    HYPERTENSION,  BENIGN ESSENTIAL 08/22/2007   MELANOMA, TRUNK 01/24/2008   Obesity 02/03/2015   PLANTAR FASCIITIS, RIGHT 07/01/2008   PSORIASIS 08/22/2007   Sleep apnea    wears CPAP    Past Surgical History:  Procedure Laterality Date   LEEP  2009   CIN I negative margin   SKIN CANCER EXCISION  2010   chest area    Current Outpatient Medications  Medication Sig Dispense Refill   albuterol  (PROVENTIL ) (2.5 MG/3ML) 0.083% nebulizer solution Take 6 mLs (5 mg total) by nebulization every 4 (four) hours as needed for wheezing. (Patient not taking: Reported on 06/15/2024) 75 mL 0   albuterol  (VENTOLIN  HFA) 108 (90 Base) MCG/ACT inhaler Inhale 2 puffs into the lungs every 4 (four) hours as needed for wheezing or shortness of breath. (Patient not taking: Reported on 06/15/2024) 18 g 0   allopurinol  (ZYLOPRIM ) 100 MG tablet Take 1 tablet (100 mg total) by mouth daily. 90 tablet 1   atorvastatin  (LIPITOR) 20 MG tablet Take 1 tablet (20 mg total) by mouth daily. 90 tablet 1   Blood Glucose Monitoring Suppl (TRUE METRIX METER) w/Device KIT Use  as directed 1 kit 0   carvedilol  (COREG ) 3.125 MG tablet Take 1 tablet (3.125 mg total) by mouth 2 (two) times daily with a meal. 180 tablet 1   clobetasol  (TEMOVATE ) 0.05 % external solution Apply 1 Application topically 2 (two) times daily. Apply for up to 2 weeks then as needed. 50 mL 0   colchicine  0.6 MG tablet TAKE ONE TABLET TONIGHT. IF NO IMPROVEMENT IN PAIN AFTER ONE HOUR REPEAT DOSE. (Patient not taking: Reported on 06/15/2024) 30 tablet 4   Continuous Blood Gluc Receiver (DEXCOM G7 RECEIVER) DEVI Use as directed. (Patient not taking: Reported on 06/15/2024) 1 each 0   Continuous Blood Gluc Sensor (DEXCOM G7 SENSOR) MISC Change sensor every 10 days. 3 each 6   escitalopram  (LEXAPRO ) 20 MG tablet Take 1 tablet (20 mg total) by mouth daily. 90 tablet 1   Fezolinetant  (VEOZAH ) 45 MG TABS Take 1 tablet (45 mg total) by mouth daily. 30 tablet 4   furosemide  (LASIX ) 20 MG  tablet TAKE 1 TABLET BY MOUTH AS NEEDED FOR SWELLING IN LEGS (Patient not taking: Reported on 06/15/2024) 30 tablet 3   gabapentin  (NEURONTIN ) 300 MG capsule Take 1 capsule (300 mg total) by mouth 2 (two) times daily. 180 capsule 1   glimepiride  (AMARYL ) 2 MG tablet Take 1 tablet (2 mg total) by mouth daily before breakfast. 90 tablet 1   glucose blood (TRUE METRIX BLOOD GLUCOSE TEST) test strip Use as instructed 100 each 0   hydrALAZINE  (APRESOLINE ) 10 MG tablet Take 2 tablets (20 mg total) by mouth 2 (two) times daily. 360 tablet 4   losartan  (COZAAR ) 100 MG tablet Take 1 tablet (100 mg total) by mouth daily. 90 tablet 1   meloxicam  (MOBIC ) 15 MG tablet Take 1 tablet (15 mg total) by mouth daily. 30 tablet 0   mometasone (ELOCON) 0.1 % cream Apply 1 Application topically daily.     Semaglutide , 1 MG/DOSE, 4 MG/3ML SOPN Inject 1 mg as directed once a week. 3 mL 0   triamcinolone  cream (KENALOG ) 0.1 % Apply BID as needed to affected areas on extremities. 45 g 1   trimethoprim -polymyxin b  (POLYTRIM ) ophthalmic solution Place 1 drop into the right eye every 6 (six) hours. For 5-7 days (Patient not taking: Reported on 06/15/2024) 10 mL 0   TRUEplus Lancets 28G MISC USE AS DIRECTED 100 each 0   Current Facility-Administered Medications  Medication Dose Route Frequency Provider Last Rate Last Admin   cyanocobalamin  ((VITAMIN B-12)) injection 1,000 mcg  1,000 mcg Intramuscular Q30 days Vicci Barnie NOVAK, MD   1,000 mcg at 09/28/21 1503     ALLERGIES: Lidocaine, Hydralazine , Lisinopril -hydrochlorothiazide , Metformin  and related, Norvasc  [amlodipine  besylate], and Penicillins  Family History  Problem Relation Age of Onset   Diabetes Mother    Hypertension Mother    Fibroids Mother    Colon polyps Father    Heart disease Father    Hypertension Father    Heart attack Father    Cancer Father        liver cancer   Diabetes Brother    Hypertension Brother    Diabetes Brother    Breast cancer Neg  Hx    Colon cancer Neg Hx    Esophageal cancer Neg Hx    Stomach cancer Neg Hx    Rectal cancer Neg Hx     Social History   Socioeconomic History   Marital status: Single    Spouse name: Not on file   Number of  children: 1   Years of education: Not on file   Highest education level: Associate degree: academic program  Occupational History   Not on file  Tobacco Use   Smoking status: Never   Smokeless tobacco: Never  Vaping Use   Vaping status: Never Used  Substance and Sexual Activity   Alcohol use: No   Drug use: No   Sexual activity: Not Currently    Partners: Male    Birth control/protection: None  Other Topics Concern   Not on file  Social History Narrative   Work or School: daycare in infant room      Home Situation: lives alone      Spiritual Beliefs: none      Lifestyle: no regular exercise; diet is poor            Social Drivers of Corporate investment banker Strain: Low Risk  (06/15/2024)   Overall Financial Resource Strain (CARDIA)    Difficulty of Paying Living Expenses: Not very hard  Food Insecurity: No Food Insecurity (06/15/2024)   Hunger Vital Sign    Worried About Running Out of Food in the Last Year: Never true    Ran Out of Food in the Last Year: Never true  Transportation Needs: No Transportation Needs (06/15/2024)   PRAPARE - Administrator, Civil Service (Medical): No    Lack of Transportation (Non-Medical): No  Physical Activity: Inactive (06/15/2024)   Exercise Vital Sign    Days of Exercise per Week: 0 days    Minutes of Exercise per Session: 0 min  Stress: No Stress Concern Present (06/15/2024)   [employer] of Occupational Health - Occupational Stress Questionnaire    Feeling of Stress: Only a little  Social Connections: Socially Isolated (06/15/2024)   Social Connection and Isolation Panel    Frequency of Communication with Friends and Family: More than three times a week    Frequency of Social Gatherings with  Friends and Family: More than three times a week    Attends Religious Services: Never    Database administrator or Organizations: No    Attends Banker Meetings: Never    Marital Status: Divorced  Catering manager Violence: Not At Risk (06/15/2024)   Humiliation, Afraid, Rape, and Kick questionnaire    Fear of Current or Ex-Partner: No    Emotionally Abused: No    Physically Abused: No    Sexually Abused: No    Review of Systems  PHYSICAL EXAMINATION:   There were no vitals taken for this visit.    General appearance: alert, cooperative and appears stated age Head: Normocephalic, without obvious abnormality, atraumatic Neck: no adenopathy, supple, symmetrical, trachea midline and thyroid  normal to inspection and palpation Lungs: clear to auscultation bilaterally Breasts: normal appearance, no masses or tenderness, No nipple retraction or dimpling, No nipple discharge or bleeding, No axillary or supraclavicular adenopathy Heart: regular rate and rhythm Abdomen: soft, non-tender, no masses,  no organomegaly Extremities: extremities normal, atraumatic, no cyanosis or edema Skin: Skin color, texture, turgor normal. No rashes or lesions Lymph nodes: Cervical, supraclavicular, and axillary nodes normal. No abnormal inguinal nodes palpated Neurologic: Grossly normal  Pelvic: External genitalia:  no lesions              Urethra:  normal appearing urethra with no masses, tenderness or lesions              Bartholins and Skenes: normal  Vagina: normal appearing vagina with normal color and discharge, no lesions              Cervix: no lesions                Bimanual Exam:  Uterus:  normal size, contour, position, consistency, mobility, non-tender              Adnexa: no mass, fullness, tenderness              Rectal exam: {yes no:314532}.  Confirms.              Anus:  normal sphincter tone, no lesions  Chaperone was present for exam:   {BSCHAPERONE:31226::Emily F, CMA}  ASSESSMENT:    PLAN:    {LABS (Optional):23779}  ***  total time was spent for this patient encounter, including preparation, face-to-face counseling with the patient, coordination of care, and documentation of the encounter.

## 2024-08-14 ENCOUNTER — Ambulatory Visit: Admitting: Obstetrics and Gynecology

## 2024-08-30 ENCOUNTER — Other Ambulatory Visit: Payer: Self-pay

## 2024-09-05 ENCOUNTER — Other Ambulatory Visit: Payer: Self-pay | Admitting: Internal Medicine

## 2024-09-05 DIAGNOSIS — E1142 Type 2 diabetes mellitus with diabetic polyneuropathy: Secondary | ICD-10-CM

## 2024-09-07 ENCOUNTER — Other Ambulatory Visit: Payer: Self-pay

## 2024-09-07 ENCOUNTER — Telehealth: Payer: Self-pay | Admitting: *Deleted

## 2024-09-07 ENCOUNTER — Ambulatory Visit (INDEPENDENT_AMBULATORY_CARE_PROVIDER_SITE_OTHER): Payer: Self-pay | Admitting: Podiatry

## 2024-09-07 DIAGNOSIS — Z91199 Patient's noncompliance with other medical treatment and regimen due to unspecified reason: Secondary | ICD-10-CM

## 2024-09-07 DIAGNOSIS — M722 Plantar fascial fibromatosis: Secondary | ICD-10-CM

## 2024-09-07 MED ORDER — GABAPENTIN 300 MG PO CAPS
300.0000 mg | ORAL_CAPSULE | Freq: Two times a day (BID) | ORAL | 1 refills | Status: AC
  Filled 2024-09-07 – 2024-10-25 (×2): qty 60, 30d supply, fill #0

## 2024-09-07 NOTE — Progress Notes (Signed)
 Cancel 24 hours-Covid

## 2024-09-09 ENCOUNTER — Encounter: Payer: Self-pay | Admitting: Internal Medicine

## 2024-09-10 ENCOUNTER — Ambulatory Visit: Attending: Internal Medicine | Admitting: Internal Medicine

## 2024-09-10 ENCOUNTER — Encounter: Payer: Self-pay | Admitting: Internal Medicine

## 2024-09-10 DIAGNOSIS — U071 COVID-19: Secondary | ICD-10-CM

## 2024-09-10 NOTE — Progress Notes (Signed)
 Patient ID: Janet [name], female   DOB: 01-15-1971, 53 y.o.   MRN: [id number] Virtual Visit via Video Note  I connected with Janet [name] on 09/10/2024 at 3:54 PM by a video enabled telemedicine application and verified that I am speaking with the correct person using two identifiers.  Location: Patient: home Provider: Office   I discussed the limitations of evaluation and management by telemedicine and the availability of in person appointments. The patient expressed understanding and agreed to proceed.  History of Present Illness: Pt with hx of DM with neuropathy ,HTN, HL, GERD, obesity, OSA, on CPAP, vit B12 def, vit D def, obesity, anx/dep, gout, psoriasis, melanoma, COVID-19 infection 03/2020   Discussed the use of AI scribe software for clinical note transcription with the patient, who gave verbal consent to proceed.  History of Present Illness Janet [name] is a 53 year old female who presents with COVID-19 symptoms.  Her symptoms began on Saturday evening (2 days ago) with a scratchy throat, progressing to a sensation of a head cold by Sunday morning. She took a COVID test on Sunday evening, which was positive, and retested on Monday morning with the same result.  She describes feeling extremely unwell yesterday, likening her condition to being 'hit by a truck.' She experienced a fever of 102F last night and feels achy. No sore throat or shortness of breath, but she has a cough that occasionally produces sputum, along with nasal congestion and fever.  She has been managing her symptoms with ibuprofen. 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   Outpatient Encounter Medications as of 09/10/2024  Medication Sig   albuterol  (PROVENTIL ) (2.5 MG/3ML) 0.083% nebulizer solution Take 6 mLs (5 mg total) by nebulization every 4 (four) hours as needed for wheezing. (Patient not taking: Reported on 06/15/2024)   albuterol  (VENTOLIN  HFA) 108 (90 Base)  MCG/ACT inhaler Inhale 2 puffs into the lungs every 4 (four) hours as needed for wheezing or shortness of breath. (Patient not taking: Reported on 06/15/2024)   allopurinol  (ZYLOPRIM ) 100 MG tablet Take 1 tablet (100 mg total) by mouth daily.   atorvastatin  (LIPITOR) 20 MG tablet Take 1 tablet (20 mg total) by mouth daily.   Blood Glucose Monitoring Suppl (TRUE METRIX METER) w/Device KIT Use as directed   carvedilol  (COREG ) 3.125 MG tablet Take 1 tablet (3.125 mg total) by mouth 2 (two) times daily with a meal.   clobetasol  (TEMOVATE ) 0.05 % external solution Apply 1 Application topically 2 (two) times daily. Apply for up to 2 weeks then as needed.   colchicine  0.6 MG tablet TAKE ONE TABLET TONIGHT. IF NO IMPROVEMENT IN PAIN AFTER ONE HOUR REPEAT DOSE. (Patient not taking: Reported on 06/15/2024)   Continuous Blood Gluc Receiver (DEXCOM G7 RECEIVER) DEVI Use as directed. (Patient not taking: Reported on 06/15/2024)   Continuous Blood Gluc Sensor (DEXCOM G7 SENSOR) MISC Change sensor every 10 days.   escitalopram  (LEXAPRO ) 20 MG tablet Take 1 tablet (20 mg total) by mouth daily.   Fezolinetant  (VEOZAH ) 45 MG TABS Take 1 tablet (45 mg total) by mouth daily.   furosemide  (LASIX ) 20 MG tablet TAKE 1 TABLET BY MOUTH AS NEEDED FOR SWELLING IN LEGS (Patient not taking: Reported on 06/15/2024)   gabapentin  (NEURONTIN ) 300 MG capsule Take 1 capsule (300 mg total) by mouth 2 (two) times daily.   glimepiride  (AMARYL ) 2 MG tablet Take 1 tablet (2 mg total) by mouth daily before breakfast.   glucose  blood (TRUE METRIX BLOOD GLUCOSE TEST) test strip Use as instructed   hydrALAZINE  (APRESOLINE ) 10 MG tablet Take 2 tablets (20 mg total) by mouth 2 (two) times daily.   losartan  (COZAAR ) 100 MG tablet Take 1 tablet (100 mg total) by mouth daily.   meloxicam  (MOBIC ) 15 MG tablet Take 1 tablet (15 mg total) by mouth daily.   mometasone (ELOCON) 0.1 % cream Apply 1 Application topically daily.   Semaglutide , 1 MG/DOSE, 4  MG/3ML SOPN Inject 1 mg as directed once a week.   triamcinolone  cream (KENALOG ) 0.1 % Apply BID as needed to affected areas on extremities.   trimethoprim -polymyxin b  (POLYTRIM ) ophthalmic solution Place 1 drop into the right eye every 6 (six) hours. For 5-7 days (Patient not taking: Reported on 06/15/2024)   TRUEplus Lancets 28G MISC USE AS DIRECTED   Facility-Administered Encounter Medications as of 09/10/2024  Medication   cyanocobalamin  ((VITAMIN B-12)) injection 1,000 mcg      Observations/Objective: Middle age to older caucasian female laying in chair in NAD  Assessment and Plan: Assessment and Plan Assessment & Plan COVID-19 infection . Within five-day window for antiviral treatment.  Patient prefers symptom monitoring over Paxlovid .  She had taken it in the past and did not like the sulfur taste that occurred after taking it  - I recommend Coricidin HBP or Vicks vapor rub for nasal congestion. - Advise acetaminophen  for fever and body aches to avoid blood pressure increase with Ibuprofen. - Provide work note and English as a second language teacher for return-to-work guidance. - Recommend mask-wearing for at least a week upon return to work, especially in daycare. - Send work note via Clinical cytogeneticist.    Follow Up Instructions: PRN   I discussed the assessment and treatment plan with the patient. The patient was provided an opportunity to ask questions and all were answered. The patient agreed with the plan and demonstrated an understanding of the instructions.   The patient was advised to call back or seek an in-person evaluation if the symptoms worsen or if the condition fails to improve as anticipated.  I spent 10 minutes dedicated to the care of this patient on the date of this encounter to include previsit review of of my last note, face-to-face time with patient discussing diagnosis and management and post visit writing of letter.  This note has been created with Engineer, agricultural. Any transcriptional errors are unintentional.  Barnie Louder, MD

## 2024-09-18 ENCOUNTER — Other Ambulatory Visit: Payer: Self-pay

## 2024-10-03 ENCOUNTER — Other Ambulatory Visit: Payer: Self-pay | Admitting: Internal Medicine

## 2024-10-03 DIAGNOSIS — Z1231 Encounter for screening mammogram for malignant neoplasm of breast: Secondary | ICD-10-CM

## 2024-10-08 ENCOUNTER — Encounter: Payer: Self-pay | Admitting: Internal Medicine

## 2024-10-08 ENCOUNTER — Encounter: Payer: Self-pay | Admitting: Radiology

## 2024-10-09 ENCOUNTER — Other Ambulatory Visit: Payer: Self-pay | Admitting: Internal Medicine

## 2024-10-09 ENCOUNTER — Telehealth: Payer: Self-pay

## 2024-10-09 ENCOUNTER — Other Ambulatory Visit: Payer: Self-pay

## 2024-10-09 DIAGNOSIS — I152 Hypertension secondary to endocrine disorders: Secondary | ICD-10-CM

## 2024-10-09 MED ORDER — HYDRALAZINE HCL 50 MG PO TABS
50.0000 mg | ORAL_TABLET | Freq: Two times a day (BID) | ORAL | 1 refills | Status: AC
  Filled 2024-10-09: qty 60, 30d supply, fill #0

## 2024-10-09 NOTE — Telephone Encounter (Signed)
 Office visit to discuss high risk nature of colonoscopy and review alternative screening strategies

## 2024-10-09 NOTE — Telephone Encounter (Signed)
 Dr. Abran,   This is a new patient scheduled for a direct colonoscopy with you on Friday 12/08/23. BMI 52.01. Please advise if she is ok for direct admit or if she will need an OV.  Thank you

## 2024-10-10 ENCOUNTER — Encounter: Payer: Self-pay | Admitting: Physician Assistant

## 2024-10-10 ENCOUNTER — Other Ambulatory Visit: Payer: Self-pay

## 2024-10-10 NOTE — Telephone Encounter (Signed)
 Attempt made to schedule OV. VM left for pt to call back. MyChart message will be sent as well giving full details to pt. If she returns call please have her set up for and OV with APP due to BMI > 50.

## 2024-10-15 ENCOUNTER — Telehealth: Payer: Self-pay | Admitting: Internal Medicine

## 2024-10-15 NOTE — Telephone Encounter (Signed)
 Confirmed appt for 11/11

## 2024-10-16 ENCOUNTER — Ambulatory Visit: Attending: Internal Medicine | Admitting: Internal Medicine

## 2024-10-16 ENCOUNTER — Encounter: Payer: Self-pay | Admitting: Internal Medicine

## 2024-10-16 ENCOUNTER — Other Ambulatory Visit: Payer: Self-pay

## 2024-10-16 DIAGNOSIS — E1169 Type 2 diabetes mellitus with other specified complication: Secondary | ICD-10-CM | POA: Diagnosis not present

## 2024-10-16 DIAGNOSIS — E785 Hyperlipidemia, unspecified: Secondary | ICD-10-CM | POA: Diagnosis not present

## 2024-10-16 DIAGNOSIS — F330 Major depressive disorder, recurrent, mild: Secondary | ICD-10-CM | POA: Diagnosis not present

## 2024-10-16 DIAGNOSIS — Z7984 Long term (current) use of oral hypoglycemic drugs: Secondary | ICD-10-CM

## 2024-10-16 DIAGNOSIS — E1159 Type 2 diabetes mellitus with other circulatory complications: Secondary | ICD-10-CM | POA: Diagnosis not present

## 2024-10-16 DIAGNOSIS — I10 Essential (primary) hypertension: Secondary | ICD-10-CM

## 2024-10-16 DIAGNOSIS — E119 Type 2 diabetes mellitus without complications: Secondary | ICD-10-CM

## 2024-10-16 DIAGNOSIS — I152 Hypertension secondary to endocrine disorders: Secondary | ICD-10-CM

## 2024-10-16 DIAGNOSIS — Z6841 Body Mass Index (BMI) 40.0 and over, adult: Secondary | ICD-10-CM

## 2024-10-16 LAB — POCT GLYCOSYLATED HEMOGLOBIN (HGB A1C): Hemoglobin A1C: 8.8 % — AB (ref 4.0–5.6)

## 2024-10-16 LAB — GLUCOSE, POCT (MANUAL RESULT ENTRY): POC Glucose: 182 mg/dL — AB (ref 70–99)

## 2024-10-16 MED ORDER — GLIMEPIRIDE 4 MG PO TABS
4.0000 mg | ORAL_TABLET | Freq: Every day | ORAL | 1 refills | Status: DC
Start: 2024-10-16 — End: 2024-10-25
  Filled 2024-10-16: qty 30, 30d supply, fill #0

## 2024-10-16 NOTE — Progress Notes (Signed)
 Patient ID: Janet [name], female    DOB: 1971/08/07  MRN: [id number]  CC: No chief complaint on file.   Subjective: Janet [name] is a 53 y.o. female who presents for chronic ds management. Her concerns today include:  Pt with hx of DM with neuropathy ,HTN, HL, GERD, obesity, OSA, on CPAP, vit B12 def, vit D def, obesity, anx/dep, gout, psoriasis, melanoma, COVID-19 infection 03/2020   Discussed the use of AI scribe software for clinical note transcription with the patient, who gave verbal consent to proceed.  History of Present Illness Janet [name] is a 53 year old female with diabetes, hypertension, hyperlipidemia, and obesity who presents for follow-up of her chronic medical conditions.  DM: Results for orders placed or performed in visit on 10/16/24  POCT glucose (manual entry)   Collection Time: 10/16/24  4:26 PM  Result Value Ref Range   POC Glucose 182 (A) 70 - 99 mg/dl  POCT glycosylated hemoglobin (Hb A1C)   Collection Time: 10/16/24  4:29 PM  Result Value Ref Range   Hemoglobin A1C 8.8 (A) 4.0 - 5.6 %   HbA1c POC (<> result, manual entry)     HbA1c, POC (prediabetic range)     HbA1c, POC (controlled diabetic range)    She has lost 3 pounds since her last visit in July, now weighing 300 pounds. She discontinued Ozempic  two months ago due to concerns about hair loss, which she attributes to the medication. Her diabetes management includes glimepiride  2 mg, and her recent A1c has increased to 8.8 from 8.2 in July. Her blood sugar today is 182, but her morning blood sugars typically range from 121 to 128.  Regarding her diet, there has been no significant change in appetite after discontinuing Ozempic . She drinks more water, aiming for 32 ounces or more daily at work, and has switched to diet ginger ale, avoiding sugary drinks. She tries to eat smaller portions. 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  HTN: she is taking losartan  100 mg daily, hydralazine  50 mg twice a day, and carvedilol  3.125 mg twice a day. She has been monitoring her blood pressure at home, with recent readings of 164/89 and 179/100.  She has an automated arm device.  Blood pressure today in the office is good at 128/78.  She questions the accuracy of her home device.  MDD: She discusses her mental health, noting increased feelings of depression and is noted to have a higher depression screening score since July. She continues to take Lexapro  and describes feeling unmotivated, particularly on weekends. She attributes some of her stress to caring for her 38 year old mother, who has macular degeneration and requires assistance. She is the primary caregiver, as her brother does not assist.  HM: She mentions upcoming health maintenance appointments, including a colonoscopy consultation on January 2nd with GI , and a mammogram scheduled for the end of the month. She has completed her flu shot.    Patient Active Problem List   Diagnosis Date Noted   Diabetic peripheral neuropathy (HCC) 06/15/2024   Type 2 diabetes mellitus with morbid obesity (HCC) 09/14/2021   Major depressive disorder, recurrent episode, moderate (HCC) 07/03/2021   Peripheral polyneuropathy 05/14/2021   COVID-19 virus infection 03/27/2020   Glaucoma suspect of both eyes 09/17/2019   Personal history of malignant melanoma of skin 09/17/2019   Prediabetes 02/19/2019   OSA (obstructive  sleep apnea) 01/28/2019   Seborrheic dermatitis of scalp 06/15/2018   Psoriasis 02/14/2018   Vitamin B 12 deficiency 02/14/2018   Vitamin D  deficiency 02/14/2018   Anxiety and depression 02/03/2015   Hyperlipemia 02/03/2015   Obesity 02/03/2015   Anemia due to chronic blood loss - reports history of evaluation and told due to menorrhagia 05/30/2014   Essential hypertension 08/22/2007   GERD 08/22/2007   ABFND PAP SMEAR HGSIL 08/22/2007      Current Outpatient Medications on File Prior to Visit  Medication Sig Dispense Refill   allopurinol  (ZYLOPRIM ) 100 MG tablet Take 1 tablet (100 mg total) by mouth daily. 90 tablet 1   atorvastatin  (LIPITOR) 20 MG tablet Take 1 tablet (20 mg total) by mouth daily. 90 tablet 1   Blood Glucose Monitoring Suppl (TRUE METRIX METER) w/Device KIT Use as directed 1 kit 0   carvedilol  (COREG ) 3.125 MG tablet Take 1 tablet (3.125 mg total) by mouth 2 (two) times daily with a meal. 180 tablet 1   clobetasol  (TEMOVATE ) 0.05 % external solution Apply 1 Application topically 2 (two) times daily. Apply for up to 2 weeks then as needed. 50 mL 0   escitalopram  (LEXAPRO ) 20 MG tablet Take 1 tablet (20 mg total) by mouth daily. 90 tablet 1   gabapentin  (NEURONTIN ) 300 MG capsule Take 1 capsule (300 mg total) by mouth 2 (two) times daily. 180 capsule 1   glimepiride  (AMARYL ) 2 MG tablet Take 1 tablet (2 mg total) by mouth daily before breakfast. 90 tablet 1   glucose blood (TRUE METRIX BLOOD GLUCOSE TEST) test strip Use as instructed 100 each 0   hydrALAZINE  (APRESOLINE ) 50 MG tablet Take 1 tablet (50 mg total) by mouth 2 (two) times daily. 180 tablet 1   losartan  (COZAAR ) 100 MG tablet Take 1 tablet (100 mg total) by mouth daily. 90 tablet 1   mometasone (ELOCON) 0.1 % cream Apply 1 Application topically daily.     triamcinolone  cream (KENALOG ) 0.1 % Apply BID as needed to affected areas on extremities. 45 g 1   TRUEplus Lancets 28G MISC USE AS DIRECTED 100 each 0   albuterol  (PROVENTIL ) (2.5 MG/3ML) 0.083% nebulizer solution Take 6 mLs (5 mg total) by nebulization every 4 (four) hours as needed for wheezing. (Patient not taking: Reported on 10/16/2024) 75 mL 0   albuterol  (VENTOLIN  HFA) 108 (90 Base) MCG/ACT inhaler Inhale 2 puffs into the lungs every 4 (four) hours as needed for wheezing or shortness of breath. (Patient not taking: Reported on 10/16/2024) 18 g 0   colchicine  0.6 MG tablet TAKE ONE TABLET  TONIGHT. IF NO IMPROVEMENT IN PAIN AFTER ONE HOUR REPEAT DOSE. (Patient not taking: Reported on 10/16/2024) 30 tablet 4   Continuous Blood Gluc Receiver (DEXCOM G7 RECEIVER) DEVI Use as directed. (Patient not taking: Reported on 10/16/2024) 1 each 0   Continuous Blood Gluc Sensor (DEXCOM G7 SENSOR) MISC Change sensor every 10 days. (Patient not taking: Reported on 10/16/2024) 3 each 6   Fezolinetant  (VEOZAH ) 45 MG TABS Take 1 tablet (45 mg total) by mouth daily. (Patient not taking: Reported on 10/16/2024) 30 tablet 4   furosemide  (LASIX ) 20 MG tablet TAKE 1 TABLET BY MOUTH AS NEEDED FOR SWELLING IN LEGS (Patient not taking: Reported on 10/16/2024) 30 tablet 3   meloxicam  (MOBIC ) 15 MG tablet Take 1 tablet (15 mg total) by mouth daily. (Patient not taking: Reported on 10/16/2024) 30 tablet 0   Semaglutide , 1 MG/DOSE, 4 MG/3ML SOPN Inject  1 mg as directed once a week. (Patient not taking: Reported on 10/16/2024) 3 mL 0   trimethoprim -polymyxin b  (POLYTRIM ) ophthalmic solution Place 1 drop into the right eye every 6 (six) hours. For 5-7 days (Patient not taking: Reported on 10/16/2024) 10 mL 0   Current Facility-Administered Medications on File Prior to Visit  Medication Dose Route Frequency Provider Last Rate Last Admin   cyanocobalamin  ((VITAMIN B-12)) injection 1,000 mcg  1,000 mcg Intramuscular Q30 days Vicci Barnie NOVAK, MD   1,000 mcg at 09/28/21 1503    Allergies  Allergen Reactions   Lidocaine Hypertension    ? Lidocaine allergy? Reports heart racing when has shot of medicine in cevix for leep   Hydralazine  Other (See Comments)    Edema in legs   Lisinopril -Hydrochlorothiazide  Other (See Comments)    Leg cramps.   Metformin  And Related Diarrhea   Norvasc  [Amlodipine  Besylate] Other (See Comments)    Leg pain   Penicillins Itching    Social History   Socioeconomic History   Marital status: Single    Spouse name: Not on file   Number of children: 1   Years of education: Not  on file   Highest education level: Associate degree: academic program  Occupational History   Not on file  Tobacco Use   Smoking status: Never   Smokeless tobacco: Never  Vaping Use   Vaping status: Never Used  Substance and Sexual Activity   Alcohol use: No   Drug use: No   Sexual activity: Not Currently    Partners: Male    Birth control/protection: None  Other Topics Concern   Not on file  Social History Narrative   Work or School: daycare in infant room      Home Situation: lives alone      Spiritual Beliefs: none      Lifestyle: no regular exercise; diet is poor            Social Drivers of Corporate Investment Banker Strain: Low Risk  (06/15/2024)   Overall Financial Resource Strain (CARDIA)    Difficulty of Paying Living Expenses: Not very hard  Food Insecurity: No Food Insecurity (06/15/2024)   Hunger Vital Sign    Worried About Running Out of Food in the Last Year: Never true    Ran Out of Food in the Last Year: Never true  Transportation Needs: No Transportation Needs (06/15/2024)   PRAPARE - Administrator, Civil Service (Medical): No    Lack of Transportation (Non-Medical): No  Physical Activity: Inactive (06/15/2024)   Exercise Vital Sign    Days of Exercise per Week: 0 days    Minutes of Exercise per Session: 0 min  Stress: No Stress Concern Present (06/15/2024)   [employer] of Occupational Health - Occupational Stress Questionnaire    Feeling of Stress: Only a little  Social Connections: Socially Isolated (06/15/2024)   Social Connection and Isolation Panel    Frequency of Communication with Friends and Family: More than three times a week    Frequency of Social Gatherings with Friends and Family: More than three times a week    Attends Religious Services: Never    Database Administrator or Organizations: No    Attends Banker Meetings: Never    Marital Status: Divorced  Catering Manager Violence: Not At Risk (06/15/2024)    Humiliation, Afraid, Rape, and Kick questionnaire    Fear of Current or Ex-Partner: No    Emotionally  Abused: No    Physically Abused: No    Sexually Abused: No    Family History  Problem Relation Age of Onset   Diabetes Mother    Hypertension Mother    Fibroids Mother    Colon polyps Father    Heart disease Father    Hypertension Father    Heart attack Father    Cancer Father        liver cancer   Diabetes Brother    Hypertension Brother    Diabetes Brother    Breast cancer Neg Hx    Colon cancer Neg Hx    Esophageal cancer Neg Hx    Stomach cancer Neg Hx    Rectal cancer Neg Hx     Past Surgical History:  Procedure Laterality Date   LEEP  2009   CIN I negative margin   SKIN CANCER EXCISION  2010   chest area    ROS: Review of Systems Negative except as stated above  PHYSICAL EXAM: BP 128/78 (BP Location: Left Arm, Patient Position: Sitting, Cuff Size: Normal)   Pulse 83   Resp 19   Ht 5' 4 (1.626 m)   Wt (!) 300 lb 3.2 oz (136.2 kg)   SpO2 96%   BMI 51.53 kg/m  Wt Readings from Last 3 Encounters:  10/16/24 (!) 300 lb 3.2 oz (136.2 kg)  06/15/24 (!) 303 lb (137.4 kg)  11/15/23 (!) 303 lb (137.4 kg)     Physical Exam  General appearance - alert, well appearing, middle-age Caucasian female and in no distress Mental status - normal mood, behavior, speech, dress, motor activity, and thought processes Neck - supple, no significant adenopathy Chest - clear to auscultation, no wheezes, rales or rhonchi, symmetric air entry Heart - normal rate, regular rhythm, normal S1, S2, no murmurs, rubs, clicks or gallops Extremities - peripheral pulses normal, no pedal edema, no clubbing or cyanosis Skin -patient's hair does appear thin.     10/16/2024    4:27 PM 06/15/2024    4:29 PM 11/15/2023    4:54 PM  Depression screen PHQ 2/9  Decreased Interest 2 1 1   Down, Depressed, Hopeless 2 2 1   PHQ - 2 Score 4 3 2   Altered sleeping 1 0 1  Tired, decreased  energy 3 3 1   Change in appetite 2 2 1   Feeling bad or failure about yourself  1 0 0  Trouble concentrating 1 1 0  Moving slowly or fidgety/restless 0 0 0  Suicidal thoughts 0 0 0  PHQ-9 Score 12 9  5    Difficult doing work/chores Not difficult at all Somewhat difficult Somewhat difficult     Data saved with a previous flowsheet row definition      10/16/2024    4:28 PM 06/15/2024    4:30 PM 11/15/2023    4:54 PM 07/14/2023   10:56 AM  GAD 7 : Generalized Anxiety Score  Nervous, Anxious, on Edge 1 1 1  0  Control/stop worrying 2 1 1 1   Worry too much - different things 1 1 1 1   Trouble relaxing 1 1 1 1   Restless 0 1 1 0  Easily annoyed or irritable 1 1 1 1   Afraid - awful might happen 0 1 1 0  Total GAD 7 Score 6 7 7 4   Anxiety Difficulty Not difficult at all Not difficult at all Somewhat difficult         Latest Ref Rng & Units 11/18/2023    4:27  PM 07/14/2023   11:56 AM 11/26/2022   10:33 AM  CMP  Glucose 70 - 99 mg/dL 784   856   BUN 6 - 24 mg/dL 9   11   Creatinine 9.42 - 1.00 mg/dL 9.30   9.29   Sodium 865 - 144 mmol/L 140   141   Potassium 3.5 - 5.2 mmol/L 4.4   4.1   Chloride 96 - 106 mmol/L 99   101   CO2 20 - 29 mmol/L 24   25   Calcium  8.7 - 10.2 mg/dL 9.7   9.2   Total Protein 6.0 - 8.5 g/dL 7.0  6.8  6.8   Total Bilirubin 0.0 - 1.2 mg/dL <9.7  <9.7  0.2   Alkaline Phos 44 - 121 IU/L 106  89  71   AST 0 - 40 IU/L 19  22  20    ALT 0 - 32 IU/L 22  19  17     Lipid Panel     Component Value Date/Time   CHOL 152 11/18/2023 1627   TRIG 120 11/18/2023 1627   HDL 59 11/18/2023 1627   CHOLHDL 2.6 11/18/2023 1627   CHOLHDL 4.0 12/14/2016 1421   VLDL 32 (H) 12/14/2016 1421   LDLCALC 72 11/18/2023 1627    CBC    Component Value Date/Time   WBC 8.7 06/15/2024 1702   WBC 6.1 03/25/2020 1609   RBC 4.25 06/15/2024 1702   RBC 4.09 03/25/2020 1609   HGB 12.0 06/15/2024 1702   HCT 38.0 06/15/2024 1702   PLT 371 06/15/2024 1702   MCV 89 06/15/2024 1702   MCH  28.2 06/15/2024 1702   MCH 30.8 03/25/2020 1609   MCHC 31.6 06/15/2024 1702   MCHC 31.8 03/25/2020 1609   RDW 13.9 06/15/2024 1702   LYMPHSABS 3.0 09/29/2017 1544   MONOABS 0.6 10/11/2016 0745   EOSABS 0.2 09/29/2017 1544   BASOSABS 0.0 09/29/2017 1544    ASSESSMENT AND PLAN: 1. Type 2 diabetes mellitus with morbid obesity (HCC) (Primary) Not at goal. She stopped Ozempic  because it was causing hair loss.  We agreed to increase the glimepiride  to 4 mg daily.  Continue to monitor blood sugars.  Healthy eating habits encouraged. - POCT glycosylated hemoglobin (Hb A1C) - POCT glucose (manual entry) - glimepiride  (AMARYL ) 4 MG tablet; Take 1 tablet (4 mg total) by mouth daily before breakfast.  Dispense: 90 tablet; Refill: 1 - Comprehensive metabolic panel with GFR; Future - Lipid panel; Future - Microalbumin / creatinine urine ratio; Future  2. Diabetes mellitus treated with oral medication (HCC) See #1 above.  3. Hypertension associated with type 2 diabetes mellitus (HCC) Home blood pressure readings are elevated but blood pressure in the office today is good.  We discussed having her write down her readings and come in to see our clinical pharmacist in 1 month and bring her home device with her so that blood pressure can be checked with her device and ours. Continue carvedilol  3.125 mg twice a day, hydralazine  50 mg twice a day and Cozaar  100 mg daily  4. Hyperlipidemia associated with type 2 diabetes mellitus (HCC) Continue atorvastatin  20 mg daily  5. Major depressive disorder, recurrent episode, mild Patient continues to struggle with depression.  Stress of taking care of her mother contributes.  She will continue the Lexapro .  She feels she would benefit from seeing a counselor. - Ambulatory referral to Psychiatry    Patient was given the opportunity to ask questions.  Patient verbalized  understanding of the plan and was able to repeat key elements of the plan.   This  documentation was completed using Paediatric nurse.  Any transcriptional errors are unintentional.  Orders Placed This Encounter  Procedures   POCT glycosylated hemoglobin (Hb A1C)   POCT glucose (manual entry)     Requested Prescriptions    No prescriptions requested or ordered in this encounter    No follow-ups on file.  Barnie Louder, MD, FACP

## 2024-10-16 NOTE — Patient Instructions (Signed)
  VISIT SUMMARY: Today, we reviewed your chronic medical conditions, including diabetes, hypertension, hyperlipidemia, obesity, and depression. We discussed your recent weight loss, changes in your diabetes management, and your current medications. We also addressed your mental health and upcoming health maintenance appointments.  YOUR PLAN: -TYPE 2 DIABETES MELLITUS WITH POOR GLYCEMIC CONTROL: Your A1c has increased to 8.8%, indicating that your blood sugar levels are not well controlled. We have increased your glimepiride  dose to 4 mg daily to help manage your blood sugar. Your prescription has been sent to the pharmacy.  -HYPERTENSION: Your home blood pressure readings are elevated. We will continue your current medications: Cozaar  100 mg daily, hydralazine  50 mg twice daily, and carvedilol  3.125 mg twice daily. We have referred you to a clinical pharmacist to verify the accuracy of your home blood pressure machine. Please bring your home blood pressure readings and device to your next appointment.  -OBESITY: Your weight has decreased to 300 lbs. Continue to stay hydrated and control your portion sizes. Your activity level at work is good, but try to be more active on weekends as well.  -HYPERLIPIDEMIA: We did not discuss your cholesterol levels today, but we will order a lipid panel in December to check your levels.  -DEPRESSION: You have reported increased symptoms of depression and stress from caregiving. Continue taking Lexapro  as prescribed. We have referred you to behavioral health for counseling.  -GENERAL HEALTH MAINTENANCE: You have scheduled a colonoscopy consultation for January 2nd and a mammogram for the end of the month. You have also completed your flu shot. We have ordered urine microalbumin, kidney and liver function tests, and a cholesterol panel for December.  INSTRUCTIONS: Please follow up with the clinical pharmacist to verify your home blood pressure machine's accuracy.  Bring your home blood pressure readings and device to your next appointment. Attend your colonoscopy consultation on January 2nd and complete your mammogram at the end of the month. We will also need to do urine microalbumin, kidney and liver function tests, and a cholesterol panel in December.                      Contains text generated by [employer]bridge.                                 Contains text generated by [employer]bridge.

## 2024-10-19 ENCOUNTER — Encounter

## 2024-10-23 ENCOUNTER — Ambulatory Visit: Payer: Self-pay | Attending: Internal Medicine

## 2024-10-23 DIAGNOSIS — E1169 Type 2 diabetes mellitus with other specified complication: Secondary | ICD-10-CM | POA: Diagnosis not present

## 2024-10-24 ENCOUNTER — Ambulatory Visit: Payer: Self-pay | Admitting: Internal Medicine

## 2024-10-25 ENCOUNTER — Other Ambulatory Visit: Payer: Self-pay | Admitting: Internal Medicine

## 2024-10-25 ENCOUNTER — Other Ambulatory Visit: Payer: Self-pay

## 2024-10-25 DIAGNOSIS — E1169 Type 2 diabetes mellitus with other specified complication: Secondary | ICD-10-CM

## 2024-10-25 LAB — COMPREHENSIVE METABOLIC PANEL WITH GFR
ALT: 22 IU/L (ref 0–32)
AST: 21 IU/L (ref 0–40)
Albumin: 4.2 g/dL (ref 3.8–4.9)
Alkaline Phosphatase: 109 IU/L (ref 49–135)
BUN/Creatinine Ratio: 18 (ref 9–23)
BUN: 10 mg/dL (ref 6–24)
Bilirubin Total: <0.2 mg/dL (ref 0.0–1.2)
CO2: 25 mmol/L (ref 20–29)
Calcium: 9.6 mg/dL (ref 8.7–10.2)
Chloride: 101 mmol/L (ref 96–106)
Creatinine, Ser: 0.56 mg/dL — ABNORMAL LOW (ref 0.57–1.00)
Globulin, Total: 3.1 g/dL (ref 1.5–4.5)
Glucose: 130 mg/dL — ABNORMAL HIGH (ref 70–99)
Potassium: 4.1 mmol/L (ref 3.5–5.2)
Sodium: 140 mmol/L (ref 134–144)
Total Protein: 7.3 g/dL (ref 6.0–8.5)
eGFR: 110 mL/min/1.73 (ref >59)

## 2024-10-25 LAB — MICROALBUMIN / CREATININE URINE RATIO
Creatinine, Urine: 158.9 mg/dL
Microalb/Creat Ratio: 213 mg/g{creat} — AB (ref 0–29)
Microalbumin, Urine: 338.4 ug/mL

## 2024-10-25 LAB — LIPID PANEL
Chol/HDL Ratio: 3.1 ratio (ref 0.0–4.4)
Cholesterol, Total: 181 mg/dL (ref 100–199)
HDL: 59 mg/dL (ref >39)
LDL Chol Calc (NIH): 101 mg/dL — ABNORMAL HIGH (ref 0–99)
Triglycerides: 117 mg/dL (ref 0–149)
VLDL Cholesterol Cal: 21 mg/dL (ref 5–40)

## 2024-10-25 MED ORDER — ATORVASTATIN CALCIUM 40 MG PO TABS
40.0000 mg | ORAL_TABLET | Freq: Every day | ORAL | 1 refills | Status: AC
  Filled 2024-10-25: qty 30, 30d supply, fill #0

## 2024-10-25 MED ORDER — GLIMEPIRIDE 4 MG PO TABS
4.0000 mg | ORAL_TABLET | Freq: Every day | ORAL | 1 refills | Status: AC
  Filled 2024-10-25: qty 30, 30d supply, fill #0

## 2024-10-26 ENCOUNTER — Other Ambulatory Visit: Payer: Self-pay

## 2024-10-31 ENCOUNTER — Ambulatory Visit

## 2024-11-09 ENCOUNTER — Encounter: Admitting: Internal Medicine

## 2024-11-13 ENCOUNTER — Ambulatory Visit

## 2024-11-13 ENCOUNTER — Ambulatory Visit: Admitting: Pharmacist

## 2024-11-23 ENCOUNTER — Other Ambulatory Visit: Payer: Self-pay

## 2024-12-02 NOTE — Progress Notes (Deleted)
 "     Janet Console, PA-C [address] [place], [place]  [address] Phone: [phone]   Gastroenterology Consultation  Referring Provider:     Vicci Barnie NOVAK, MD Primary Care Physician:  Janet Barnie NOVAK, MD Primary Gastroenterologist:  Janet Console, PA-C / *** Reason for Consultation:     Discuss colonoscopy        HPI:   Discussed the use of AI scribe software for clinical note transcription with the patient, who gave verbal consent to proceed.  53 year old female, new patient, is referred to schedule first screening colonoscopy.  Medical history significant for obesity (BMI greater than 50), diabetes, GERD, sleep apnea, hypertension, polyneuropathy, psoriasis.  [facility]  History of Present Illness      Past Medical History:  Diagnosis Date   ABFND PAP SMEAR HGSIL 08/22/2007   Anemia due to chronic blood loss - reports history of evaluation and told due to menorrhagia 05/30/2014   ANXIETY 08/22/2007   Anxiety and depression    Anxiety and depression    Diabetes 1.5, managed as type 2 (HCC)    Frequent headaches    GERD 08/22/2007   Hyperglycemia    Hyperlipemia    HYPERTENSION, BENIGN ESSENTIAL 08/22/2007   MELANOMA, TRUNK 01/24/2008   Obesity 02/03/2015   PLANTAR FASCIITIS, RIGHT 07/01/2008   PSORIASIS 08/22/2007   Sleep apnea    wears CPAP    Past Surgical History:  Procedure Laterality Date   LEEP  2009   CIN I negative margin   SKIN CANCER EXCISION  2010   chest area    Prior to Admission medications  Medication Sig Start Date End Date Taking? Authorizing Provider  albuterol  (PROVENTIL ) (2.5 MG/3ML) 0.083% nebulizer solution Take 6 mLs (5 mg total) by nebulization every 4 (four) hours as needed for wheezing. Patient not taking: Reported on 10/16/2024 12/27/16   Janet Chew, PA-C  albuterol  (VENTOLIN  HFA) 108 (90 Base) MCG/ACT inhaler Inhale 2 puffs into the lungs every 4 (four) hours as needed  for wheezing or shortness of breath. Patient not taking: Reported on 10/16/2024 03/25/20   Janet Doffing A, PA-C  allopurinol  (ZYLOPRIM ) 100 MG tablet Take 1 tablet (100 mg total) by mouth daily. 01/19/24   Janet Barnie NOVAK, MD  atorvastatin  (LIPITOR) 40 MG tablet Take 1 tablet (40 mg total) by mouth daily. 10/25/24   Janet Barnie NOVAK, MD  Blood Glucose Monitoring Suppl (TRUE METRIX METER) w/Device KIT Use as directed 09/14/22   Janet Barnie NOVAK, MD  carvedilol  (COREG ) 3.125 MG tablet Take 1 tablet (3.125 mg total) by mouth 2 (two) times daily with [name] meal. 01/19/24   Janet Barnie NOVAK, MD  clobetasol  (TEMOVATE ) 0.05 % external solution Apply 1 Application topically 2 (two) times daily. Apply for up to 2 weeks then as needed. 03/14/24   Janet Barnie NOVAK, MD  colchicine  0.6 MG tablet TAKE ONE TABLET TONIGHT. IF NO IMPROVEMENT IN PAIN AFTER ONE HOUR REPEAT DOSE. Patient not taking: Reported on 10/16/2024 06/22/21 07/17/22  Janet Barnie NOVAK, MD  Continuous Blood Gluc Receiver (DEXCOM G7 RECEIVER) DEVI Use as directed. Patient not taking: Reported on 10/16/2024 11/22/22   Janet Barnie NOVAK, MD  Continuous Blood Gluc Sensor (DEXCOM G7 SENSOR) MISC Change sensor every 10 days. Patient not taking: Reported on 10/16/2024 11/22/22   Janet Barnie NOVAK, MD  escitalopram  (LEXAPRO ) 20 MG tablet Take 1 tablet (20 mg total) by mouth daily. 06/15/24   Janet Barnie NOVAK, MD  gabapentin  (NEURONTIN ) 300 MG capsule Take 1 capsule (300 mg total) by mouth 2 (two) times daily. 09/07/24   Newlin, Enobong, MD  glimepiride  (AMARYL ) 4 MG tablet Take 1 tablet (4 mg total) by mouth daily before breakfast. 10/25/24   Janet Barnie NOVAK, MD  glucose blood (TRUE METRIX BLOOD GLUCOSE TEST) test strip Use as instructed 09/14/22   Janet Barnie NOVAK, MD  hydrALAZINE  (APRESOLINE ) 50 MG tablet Take 1 tablet (50 mg total) by mouth 2 (two) times daily. 10/09/24   Janet Barnie NOVAK, MD  losartan  (COZAAR ) 100 MG tablet Take 1 tablet  (100 mg total) by mouth daily. 01/19/24   Janet Barnie NOVAK, MD  mometasone (ELOCON) 0.1 % cream Apply 1 Application topically daily.    Janet Blamer, MD  triamcinolone  cream (KENALOG ) 0.1 % Apply BID as needed to affected areas on extremities. 02/14/18   Janet Barnie NOVAK, MD  TRUEplus Lancets 28G MISC USE AS DIRECTED 09/14/22   Janet Barnie NOVAK, MD    Family History  Problem Relation Age of Onset   Diabetes Mother    Hypertension Mother    Fibroids Mother    Colon polyps Father    Heart disease Father    Hypertension Father    Heart attack Father    Cancer Father        liver cancer   Diabetes Brother    Hypertension Brother    Diabetes Brother    Breast cancer Neg Hx    Colon cancer Neg Hx    Esophageal cancer Neg Hx    Stomach cancer Neg Hx    Rectal cancer Neg Hx      Social History[1]  Allergies as of 12/03/2024 - Review Complete 10/16/2024  Allergen Reaction Noted   Lidocaine Hypertension 05/30/2014   Hydralazine  Other (See Comments) 07/30/2020   Ozempic  (0.25 or 0.5 mg-dose) [semaglutide (0.25 or 0.5mg -dos)] Other (See Comments) 10/16/2024   Lisinopril -hydrochlorothiazide  Other (See Comments) 04/25/2017   Metformin  and related Diarrhea 02/19/2019   Norvasc  [amlodipine  besylate] Other (See Comments) 02/14/2018   Penicillins Itching     Review of Systems:    All systems reviewed and negative except where noted in HPI.   Physical Exam:  There were no vitals taken for this visit. No LMP recorded.  General:   Alert,  Well-developed, well-nourished, pleasant and cooperative in NAD Lungs:  Respirations even and unlabored.  Clear throughout to auscultation.   No wheezes, crackles, or rhonchi. No acute distress. Heart:  Regular rate and rhythm; no murmurs, clicks, rubs, or gallops. Abdomen:  Normal bowel sounds.  No bruits.  Soft, and non-distended without masses, hepatosplenomegaly or hernias noted.  No Tenderness.  No guarding or rebound tenderness.    Neurologic:   Alert and oriented x3;  grossly normal neurologically. Psych:  Alert and cooperative. Normal mood and affect.   Imaging Studies: No results found.  Labs: CBC    Component Value Date/Time   WBC 8.7 06/15/2024 1702   WBC 6.1 03/25/2020 1609   RBC 4.25 06/15/2024 1702   RBC 4.09 03/25/2020 1609   HGB 12.0 06/15/2024 1702   HCT 38.0 06/15/2024 1702   PLT 371 06/15/2024 1702   MCV 89 06/15/2024 1702    CMP     Component Value Date/Time   NA 140 10/23/2024 1640   K 4.1 10/23/2024 1640   CL 101 10/23/2024 1640   CO2 25 10/23/2024 1640   GLUCOSE 130 (H) 10/23/2024 1640   GLUCOSE 104 (H) 03/25/2020 1609  BUN 10 10/23/2024 1640   CREATININE 0.56 (L) 10/23/2024 1640   CALCIUM  9.6 10/23/2024 1640   PROT 7.3 10/23/2024 1640   ALBUMIN 4.2 10/23/2024 1640   AST 21 10/23/2024 1640   ALT 22 10/23/2024 1640   ALKPHOS 109 10/23/2024 1640   BILITOT <0.2 10/23/2024 1640   GFRNONAA 105 10/09/2020 1657   GFRAA 121 10/09/2020 1657    Assessment and Plan:   Janet [name] is [name] 53 y.o. y/o female has been referred for ***  Assessment and Plan Assessment & Plan       Follow up ***  Janet Console, PA-C       [1]  Social History Tobacco Use   Smoking status: Never   Smokeless tobacco: Never  Vaping Use   Vaping status: Never Used  Substance Use Topics   Alcohol use: No   Drug use: No   "

## 2024-12-03 ENCOUNTER — Ambulatory Visit: Admitting: Physician Assistant

## 2024-12-07 ENCOUNTER — Encounter: Admitting: Internal Medicine

## 2024-12-07 ENCOUNTER — Ambulatory Visit
Admission: RE | Admit: 2024-12-07 | Discharge: 2024-12-07 | Disposition: A | Discharge status: Home / Self Care | Source: Ambulatory Visit | Attending: Internal Medicine | Admitting: Internal Medicine

## 2024-12-07 DIAGNOSIS — Z1231 Encounter for screening mammogram for malignant neoplasm of breast: Secondary | ICD-10-CM

## 2024-12-10 ENCOUNTER — Encounter: Payer: Self-pay | Admitting: Internal Medicine

## 2024-12-11 ENCOUNTER — Ambulatory Visit: Payer: Self-pay | Admitting: Internal Medicine

## 2024-12-13 NOTE — Telephone Encounter (Signed)
 I called to ask the patient to arrive 20 minutes prior to her appointment because xrays are needed.  I asked her to stop by imaging first.

## 2024-12-14 ENCOUNTER — Ambulatory Visit: Attending: Internal Medicine | Admitting: Internal Medicine

## 2024-12-14 ENCOUNTER — Ambulatory Visit: Payer: Self-pay

## 2024-12-14 ENCOUNTER — Encounter: Payer: Self-pay | Admitting: Internal Medicine

## 2024-12-14 ENCOUNTER — Other Ambulatory Visit: Payer: Self-pay

## 2024-12-14 VITALS — BP 151/89 | HR 91 | Temp 98.1°F | Ht 64.0 in | Wt 296.0 lb

## 2024-12-14 DIAGNOSIS — J029 Acute pharyngitis, unspecified: Secondary | ICD-10-CM

## 2024-12-14 DIAGNOSIS — J0100 Acute maxillary sinusitis, unspecified: Secondary | ICD-10-CM | POA: Diagnosis not present

## 2024-12-14 MED ORDER — DOXYCYCLINE HYCLATE 100 MG PO TABS
100.0000 mg | ORAL_TABLET | Freq: Two times a day (BID) | ORAL | 0 refills | Status: AC
  Filled 2024-12-14: qty 14, 7d supply, fill #0

## 2024-12-14 NOTE — Telephone Encounter (Signed)
 FYI Only or Action Required?: FYI only for provider: appointment scheduled on 1/9.  Patient was last seen in primary care on 10/16/2024 by Vicci Barnie NOVAK, MD.  Called Nurse Triage reporting Adenopathy.  Symptoms began a week ago.  Interventions attempted: Rest, hydration, or home remedies.  Symptoms are: gradually worsening.  Triage Disposition: See Physician Within 24 Hours  Patient/caregiver understands and will follow disposition?: Yes      Copied from CRM [id number]. Topic: Clinical - Red Word Triage >> Dec 14, 2024  8:20 AM Delon HERO wrote: Red Word that prompted transfer to Nurse Triage: Patient is calling to report that her neck is swollen bad on the side,  Advised via MyChart to Schedule Appointment. Reason for Disposition  [1] Single large node AND [2] size > 1 inch (2.5 cm) AND [3] no fever  Answer Assessment - Initial Assessment Questions This RN recommended pt be examined today, scheduled for this afternoon with PCP. Advised call back or seek immediate care if new or worsening symptoms.    Symptoms: Swollen lymph node in neck knot Sore throat, cold for past week Blood in nasal mucus  Denies: SOB Difficulty swallowing Chest pain Taking ACE inhibitor Fever Pus to back of throat  Protocols used: Lymph Nodes - Swollen-A-AH

## 2024-12-14 NOTE — Progress Notes (Signed)
 "   Patient ID: Janet [name], female    DOB: Jul 09, 1971  MRN: [id number]  CC: Sore Throat (Sore throat, RT swollen lymph node, bloody mucus X1 week/OTC not alleviating /Already received flu vax)   Subjective: Janet [name] is a 54 y.o. female who presents for UC visit  Her chronic medical issues include:  Pt with hx of DM with neuropathy ,HTN, HL, GERD, obesity, OSA, on CPAP, vit B12 def, vit D def, obesity, anx/dep, gout, psoriasis, melanoma, COVID-19 infection 03/2020   Discussed the use of AI scribe software for clinical note transcription with the patient, who gave verbal consent to proceed.  History of Present Illness Janet [name] is a 54 year old female who presents with symptoms of a possible sinus infection.  Symptoms began around January 2nd with a sore throat, which was not severe enough to be considered strep throat. The sore throat persisted throughout the weekend, during which she took Tylenol  for relief. The sore throat has since resolved.  She has been experiencing fatigue and a swollen lymph node on the right side of her neck, which she noticed two to three days ago. Nasal congestion started last week, accompanied by blood tinge mucous from left nostril when she blows her nose. The nasal discharge is described as yellow mucus mixed with blood, and she notices blood every time she blows her nose. There is some sinus pressure. Not on any allergy nasal spray. No nose bleeds in the past.  She has had a dry cough that occurs infrequently. No fever, current sore throat, or pain when swallowing.     Patient Active Problem List   Diagnosis Date Noted   Diabetic peripheral neuropathy (HCC) 06/15/2024   Type 2 diabetes mellitus with morbid obesity (HCC) 09/14/2021   Major depressive disorder, recurrent episode, moderate (HCC) 07/03/2021   Peripheral polyneuropathy 05/14/2021   COVID-19 virus infection 03/27/2020   Glaucoma suspect of both eyes 09/17/2019   Personal  history of malignant melanoma of skin 09/17/2019   Prediabetes 02/19/2019   OSA (obstructive sleep apnea) 01/28/2019   Seborrheic dermatitis of scalp 06/15/2018   Psoriasis 02/14/2018   Vitamin B 12 deficiency 02/14/2018   Vitamin D  deficiency 02/14/2018   Anxiety and depression 02/03/2015   Hyperlipemia 02/03/2015   Obesity 02/03/2015   Anemia due to chronic blood loss - reports history of evaluation and told due to menorrhagia 05/30/2014   Essential hypertension 08/22/2007   GERD 08/22/2007   ABFND PAP SMEAR HGSIL 08/22/2007     Medications Ordered Prior to Encounter[1]  Allergies[2]  Social History   Socioeconomic History   Marital status: Single    Spouse name: Not on file   Number of children: 1   Years of education: Not on file   Highest education level: Associate degree: academic program  Occupational History   Not on file  Tobacco Use   Smoking status: Never   Smokeless tobacco: Never  Vaping Use   Vaping status: Never Used  Substance and Sexual Activity   Alcohol use: No   Drug use: No   Sexual activity: Not Currently    Partners: Male    Birth control/protection: None  Other Topics Concern   Not on file  Social History Narrative   Work or School: daycare in infant room      Home Situation: lives alone      Spiritual Beliefs: none      Lifestyle: no regular exercise; diet is poor  Social Drivers of Health   Tobacco Use: Low Risk (10/16/2024)   Patient History    Smoking Tobacco Use: Never    Smokeless Tobacco Use: Never    Passive Exposure: Not on file  Financial Resource Strain: Low Risk (06/15/2024)   Overall Financial Resource Strain (CARDIA)    Difficulty of Paying Living Expenses: Not very hard  Food Insecurity: No Food Insecurity (06/15/2024)   Epic    Worried About Programme Researcher, Broadcasting/film/video in the Last Year: Never true    Ran Out of Food in the Last Year: Never true  Transportation Needs: No Transportation Needs (06/15/2024)    Epic    Lack of Transportation (Medical): No    Lack of Transportation (Non-Medical): No  Physical Activity: Inactive (06/15/2024)   Exercise Vital Sign    Days of Exercise per Week: 0 days    Minutes of Exercise per Session: 0 min  Stress: No Stress Concern Present (06/15/2024)   [employer] of Occupational Health - Occupational Stress Questionnaire    Feeling of Stress: Only a little  Social Connections: Socially Isolated (06/15/2024)   Social Connection and Isolation Panel    Frequency of Communication with Friends and Family: More than three times a week    Frequency of Social Gatherings with Friends and Family: More than three times a week    Attends Religious Services: Never    Database Administrator or Organizations: No    Attends Banker Meetings: Never    Marital Status: Divorced  Catering Manager Violence: Not At Risk (06/15/2024)   Epic    Fear of Current or Ex-Partner: No    Emotionally Abused: No    Physically Abused: No    Sexually Abused: No  Depression (PHQ2-9): High Risk (10/16/2024)   Depression (PHQ2-9)    PHQ-2 Score: 12  Alcohol Screen: Low Risk (06/15/2024)   Alcohol Screen    Last Alcohol Screening Score (AUDIT): 0  Housing: Low Risk (06/15/2024)   Epic    Unable to Pay for Housing in the Last Year: No    Number of Times Moved in the Last Year: 0    Homeless in the Last Year: No  Utilities: Not At Risk (06/15/2024)   Epic    Threatened with loss of utilities: No  Health Literacy: Adequate Health Literacy (06/15/2024)   B1300 Health Literacy    Frequency of need for help with medical instructions: Never    Family History  Problem Relation Age of Onset   Diabetes Mother    Hypertension Mother    Fibroids Mother    Colon polyps Father    Heart disease Father    Hypertension Father    Heart attack Father    Cancer Father        liver cancer   Diabetes Brother    Hypertension Brother    Diabetes Brother    Breast cancer Neg Hx     Colon cancer Neg Hx    Esophageal cancer Neg Hx    Stomach cancer Neg Hx    Rectal cancer Neg Hx     Past Surgical History:  Procedure Laterality Date   LEEP  2009   CIN I negative margin   SKIN CANCER EXCISION  2010   chest area    ROS: Review of Systems Negative except as stated above  PHYSICAL EXAM: BP (!) 151/89 (BP Location: Left Arm, Patient Position: Sitting, Cuff Size: Normal)   Pulse 91   Temp 98.1  F (36.7 C) (Oral)   Ht 5' 4 (1.626 m)   Wt 296 lb (134.3 kg)   SpO2 96%   BMI 50.81 kg/m   Wt Readings from Last 3 Encounters:  12/14/24 296 lb (134.3 kg)  10/16/24 (!) 300 lb 3.2 oz (136.2 kg)  06/15/24 (!) 303 lb (137.4 kg)    Physical Exam  General appearance - alert, well appearing, and in no distress Mental status - normal mood, behavior, speech, dress, motor activity, and thought processes Ears -small amount of wax noted at the opening to the left ear canal otherwise both canals and tympanic membranes are within normal limits. Nose -nostrils are patent without enlargement of nasal turbinates.  She has clear to yellow mucus in the nostrils.  No blood seen. Mouth -oral mucosa is moist.  On the the middle of the throat was visualized due to gagging.  It did not appear erythematous and no exudates were seen. Neck -no masses or cervical lymphadenopathy appreciated. Chest - clear to auscultation, no wheezes, rales or rhonchi, symmetric air entry Heart - normal rate, regular rhythm, normal S1, S2, no murmurs, rubs, clicks or gallops      Latest Ref Rng & Units 10/23/2024    4:40 PM 11/18/2023    4:27 PM 07/14/2023   11:56 AM  CMP  Glucose 70 - 99 mg/dL 869  784    BUN 6 - 24 mg/dL 10  9    Creatinine 9.42 - 1.00 mg/dL 9.43  9.30    Sodium 865 - 144 mmol/L 140  140    Potassium 3.5 - 5.2 mmol/L 4.1  4.4    Chloride 96 - 106 mmol/L 101  99    CO2 20 - 29 mmol/L 25  24    Calcium  8.7 - 10.2 mg/dL 9.6  9.7    Total Protein 6.0 - 8.5 g/dL 7.3  7.0  6.8    Total Bilirubin 0.0 - 1.2 mg/dL <9.7  <9.7  <9.7   Alkaline Phos 49 - 135 IU/L 109  106  89   AST 0 - 40 IU/L 21  19  22    ALT 0 - 32 IU/L 22  22  19     Lipid Panel     Component Value Date/Time   CHOL 181 10/23/2024 1640   TRIG 117 10/23/2024 1640   HDL 59 10/23/2024 1640   CHOLHDL 3.1 10/23/2024 1640   CHOLHDL 4.0 12/14/2016 1421   VLDL 32 (H) 12/14/2016 1421   LDLCALC 101 (H) 10/23/2024 1640    CBC    Component Value Date/Time   WBC 8.7 06/15/2024 1702   WBC 6.1 03/25/2020 1609   RBC 4.25 06/15/2024 1702   RBC 4.09 03/25/2020 1609   HGB 12.0 06/15/2024 1702   HCT 38.0 06/15/2024 1702   PLT 371 06/15/2024 1702   MCV 89 06/15/2024 1702   MCH 28.2 06/15/2024 1702   MCH 30.8 03/25/2020 1609   MCHC 31.6 06/15/2024 1702   MCHC 31.8 03/25/2020 1609   RDW 13.9 06/15/2024 1702   LYMPHSABS 3.0 09/29/2017 1544   MONOABS 0.6 10/11/2016 0745   EOSABS 0.2 09/29/2017 1544   BASOSABS 0.0 09/29/2017 1544    ASSESSMENT AND PLAN: 1. Acute maxillary sinusitis, recurrence not specified (Primary) Will treat with doxycycline  since patient is allergic to penicillin. Advised to let me know if bloody mucus persists upon completion of antibiotics so that we can refer to ENT.  Avoid nasal spray for now.  Use humidifier in her  bedroom at night. - doxycycline  (VIBRA -TABS) 100 MG tablet; Take 1 tablet (100 mg total) by mouth 2 (two) times daily.  Dispense: 14 tablet; Refill: 0  2. Pharyngitis, unspecified etiology Most likely was viral but has resolved based on her history.   Patient was given the opportunity to ask questions.  Patient verbalized understanding of the plan and was able to repeat key elements of the plan.   This documentation was completed using Paediatric nurse.  Any transcriptional errors are unintentional.  No orders of the defined types were placed in this encounter.    Requested Prescriptions   Signed Prescriptions Disp Refills   doxycycline   (VIBRA -TABS) 100 MG tablet 14 tablet 0    Sig: Take 1 tablet (100 mg total) by mouth 2 (two) times daily.    No follow-ups on file.  Barnie Louder, MD, FACP     [1]  Current Outpatient Medications on File Prior to Visit  Medication Sig Dispense Refill   allopurinol  (ZYLOPRIM ) 100 MG tablet Take 1 tablet (100 mg total) by mouth daily. 90 tablet 1   atorvastatin  (LIPITOR) 40 MG tablet Take 1 tablet (40 mg total) by mouth daily. 90 tablet 1   Blood Glucose Monitoring Suppl (TRUE METRIX METER) w/Device KIT Use as directed 1 kit 0   carvedilol  (COREG ) 3.125 MG tablet Take 1 tablet (3.125 mg total) by mouth 2 (two) times daily with a meal. 180 tablet 1   clobetasol  (TEMOVATE ) 0.05 % external solution Apply 1 Application topically 2 (two) times daily. Apply for up to 2 weeks then as needed. 50 mL 0   escitalopram  (LEXAPRO ) 20 MG tablet Take 1 tablet (20 mg total) by mouth daily. 90 tablet 1   gabapentin  (NEURONTIN ) 300 MG capsule Take 1 capsule (300 mg total) by mouth 2 (two) times daily. 180 capsule 1   glimepiride  (AMARYL ) 4 MG tablet Take 1 tablet (4 mg total) by mouth daily before breakfast. 90 tablet 1   glucose blood (TRUE METRIX BLOOD GLUCOSE TEST) test strip Use as instructed 100 each 0   hydrALAZINE  (APRESOLINE ) 50 MG tablet Take 1 tablet (50 mg total) by mouth 2 (two) times daily. 180 tablet 1   losartan  (COZAAR ) 100 MG tablet Take 1 tablet (100 mg total) by mouth daily. 90 tablet 1   mometasone (ELOCON) 0.1 % cream Apply 1 Application topically daily.     triamcinolone  cream (KENALOG ) 0.1 % Apply BID as needed to affected areas on extremities. 45 g 1   TRUEplus Lancets 28G MISC USE AS DIRECTED 100 each 0   albuterol  (PROVENTIL ) (2.5 MG/3ML) 0.083% nebulizer solution Take 6 mLs (5 mg total) by nebulization every 4 (four) hours as needed for wheezing. (Patient not taking: Reported on 12/14/2024) 75 mL 0   albuterol  (VENTOLIN  HFA) 108 (90 Base) MCG/ACT inhaler Inhale 2 puffs into the  lungs every 4 (four) hours as needed for wheezing or shortness of breath. (Patient not taking: Reported on 12/14/2024) 18 g 0   colchicine  0.6 MG tablet TAKE ONE TABLET TONIGHT. IF NO IMPROVEMENT IN PAIN AFTER ONE HOUR REPEAT DOSE. (Patient not taking: Reported on 12/14/2024) 30 tablet 4   Continuous Blood Gluc Receiver (DEXCOM G7 RECEIVER) DEVI Use as directed. (Patient not taking: Reported on 12/14/2024) 1 each 0   Continuous Blood Gluc Sensor (DEXCOM G7 SENSOR) MISC Change sensor every 10 days. (Patient not taking: Reported on 12/14/2024) 3 each 6   Current Facility-Administered Medications on File Prior to Visit  Medication  Dose Route Frequency Provider Last Rate Last Admin   cyanocobalamin  ((VITAMIN B-12)) injection 1,000 mcg  1,000 mcg Intramuscular Q30 days Vicci Barnie NOVAK, MD   1,000 mcg at 09/28/21 1503  [2]  Allergies Allergen Reactions   Lidocaine Hypertension    ? Lidocaine allergy? Reports heart racing when has shot of medicine in cevix for leep   Hydralazine  Other (See Comments)    Edema in legs   Ozempic  (0.25 Or 0.5 Mg-Dose) [Semaglutide (0.25 Or 0.5mg -Dos)] Other (See Comments)    Hair loss   Lisinopril -Hydrochlorothiazide  Other (See Comments)    Leg cramps.   Metformin  And Related Diarrhea   Norvasc  [Amlodipine  Besylate] Other (See Comments)    Leg pain   Penicillins Itching   "

## 2024-12-14 NOTE — Telephone Encounter (Signed)
 Noted. Concerns will be addressed at today's visit.

## 2024-12-14 NOTE — Patient Instructions (Addendum)
" °  VISIT SUMMARY: During your visit, we discussed your symptoms of a possible sinus infection, including nasal congestion, nosebleeds, and sinus pressure. You also mentioned experiencing fatigue and a swollen lymph node on the right side of your neck.  YOUR PLAN: -ACUTE SINUSITIS: Acute sinusitis is an infection of the sinuses that can cause nasal congestion, sinus pressure, and nosebleeds. We have prescribed antibiotics for you, making sure to avoid penicillin due to your allergy. Please report any persistent nosebleeds after completing the antibiotics, as this may require a referral to an ear, nose, and throat specialist. Additionally, avoid using Flonase nasal spray. Use a humidifier in your bedroom.  INSTRUCTIONS: Please complete the prescribed antibiotics and monitor your symptoms. If you continue to experience nosebleeds after finishing the antibiotics, contact our office for a potential referral to an ENT specialist.                      Contains text generated by [employer].                                 Contains text generated by [employer]bridge.   "

## 2025-01-01 ENCOUNTER — Ambulatory Visit: Admitting: Gastroenterology

## 2025-01-01 NOTE — Progress Notes (Unsigned)
 "  Chief Complaint: Primary GI MD:  HPI:  *** is a  ***  who was referred to me by Vicci Barnie NOVAK, MD for a complaint of *** .     Discussed the use of AI scribe software for clinical note transcription with the patient, who gave verbal consent to proceed.  History of Present Illness      PREVIOUS GI WORKUP     Past Medical History:  Diagnosis Date   ABFND PAP SMEAR HGSIL 08/22/2007   Anemia due to chronic blood loss - reports history of evaluation and told due to menorrhagia 05/30/2014   ANXIETY 08/22/2007   Anxiety and depression    Anxiety and depression    Diabetes 1.5, managed as type 2 (HCC)    Frequent headaches    GERD 08/22/2007   Hyperglycemia    Hyperlipemia    HYPERTENSION, BENIGN ESSENTIAL 08/22/2007   MELANOMA, TRUNK 01/24/2008   Obesity 02/03/2015   PLANTAR FASCIITIS, RIGHT 07/01/2008   PSORIASIS 08/22/2007   Sleep apnea    wears CPAP    Past Surgical History:  Procedure Laterality Date   LEEP  2009   CIN I negative margin   SKIN CANCER EXCISION  2010   chest area    Current Outpatient Medications  Medication Sig Dispense Refill   albuterol  (PROVENTIL ) (2.5 MG/3ML) 0.083% nebulizer solution Take 6 mLs (5 mg total) by nebulization every 4 (four) hours as needed for wheezing. (Patient not taking: Reported on 12/14/2024) 75 mL 0   albuterol  (VENTOLIN  HFA) 108 (90 Base) MCG/ACT inhaler Inhale 2 puffs into the lungs every 4 (four) hours as needed for wheezing or shortness of breath. (Patient not taking: Reported on 12/14/2024) 18 g 0   allopurinol  (ZYLOPRIM ) 100 MG tablet Take 1 tablet (100 mg total) by mouth daily. 90 tablet 1   atorvastatin  (LIPITOR) 40 MG tablet Take 1 tablet (40 mg total) by mouth daily. 90 tablet 1   Blood Glucose Monitoring Suppl (TRUE METRIX METER) w/Device KIT Use as directed 1 kit 0   carvedilol  (COREG ) 3.125 MG tablet Take 1 tablet (3.125 mg total) by mouth 2 (two) times daily with a meal. 180 tablet 1   clobetasol   (TEMOVATE ) 0.05 % external solution Apply 1 Application topically 2 (two) times daily. Apply for up to 2 weeks then as needed. 50 mL 0   colchicine  0.6 MG tablet TAKE ONE TABLET TONIGHT. IF NO IMPROVEMENT IN PAIN AFTER ONE HOUR REPEAT DOSE. (Patient not taking: Reported on 12/14/2024) 30 tablet 4   Continuous Blood Gluc Receiver (DEXCOM G7 RECEIVER) DEVI Use as directed. (Patient not taking: Reported on 12/14/2024) 1 each 0   Continuous Blood Gluc Sensor (DEXCOM G7 SENSOR) MISC Change sensor every 10 days. (Patient not taking: Reported on 12/14/2024) 3 each 6   doxycycline  (VIBRA -TABS) 100 MG tablet Take 1 tablet (100 mg total) by mouth 2 (two) times daily. 14 tablet 0   escitalopram  (LEXAPRO ) 20 MG tablet Take 1 tablet (20 mg total) by mouth daily. 90 tablet 1   gabapentin  (NEURONTIN ) 300 MG capsule Take 1 capsule (300 mg total) by mouth 2 (two) times daily. 180 capsule 1   glimepiride  (AMARYL ) 4 MG tablet Take 1 tablet (4 mg total) by mouth daily before breakfast. 90 tablet 1   glucose blood (TRUE METRIX BLOOD GLUCOSE TEST) test strip Use as instructed 100 each 0   hydrALAZINE  (APRESOLINE ) 50 MG tablet Take 1 tablet (50 mg total) by mouth 2 (two) times daily.  180 tablet 1   losartan  (COZAAR ) 100 MG tablet Take 1 tablet (100 mg total) by mouth daily. 90 tablet 1   mometasone (ELOCON) 0.1 % cream Apply 1 Application topically daily.     triamcinolone  cream (KENALOG ) 0.1 % Apply BID as needed to affected areas on extremities. 45 g 1   TRUEplus Lancets 28G MISC USE AS DIRECTED 100 each 0   Current Facility-Administered Medications  Medication Dose Route Frequency Provider Last Rate Last Admin   cyanocobalamin  ((VITAMIN B-12)) injection 1,000 mcg  1,000 mcg Intramuscular Q30 days Vicci Barnie NOVAK, MD   1,000 mcg at 09/28/21 1503    Allergies as of 01/01/2025 - Review Complete 12/14/2024  Allergen Reaction Noted   Lidocaine Hypertension 05/30/2014   Hydralazine  Other (See Comments) 07/30/2020    Ozempic  (0.25 or 0.5 mg-dose) [semaglutide (0.25 or 0.5mg -dos)] Other (See Comments) 10/16/2024   Lisinopril -hydrochlorothiazide  Other (See Comments) 04/25/2017   Metformin  and related Diarrhea 02/19/2019   Norvasc  [amlodipine  besylate] Other (See Comments) 02/14/2018   Penicillins Itching     Family History  Problem Relation Age of Onset   Diabetes Mother    Hypertension Mother    Fibroids Mother    Colon polyps Father    Heart disease Father    Hypertension Father    Heart attack Father    Cancer Father        liver cancer   Diabetes Brother    Hypertension Brother    Diabetes Brother    Breast cancer Neg Hx    Colon cancer Neg Hx    Esophageal cancer Neg Hx    Stomach cancer Neg Hx    Rectal cancer Neg Hx     Social History   Socioeconomic History   Marital status: Single    Spouse name: Not on file   Number of children: 1   Years of education: Not on file   Highest education level: Associate degree: academic program  Occupational History   Not on file  Tobacco Use   Smoking status: Never   Smokeless tobacco: Never  Vaping Use   Vaping status: Never Used  Substance and Sexual Activity   Alcohol use: No   Drug use: No   Sexual activity: Not Currently    Partners: Male    Birth control/protection: None  Other Topics Concern   Not on file  Social History Narrative   Work or School: daycare in infant room      Home Situation: lives alone      Spiritual Beliefs: none      Lifestyle: no regular exercise; diet is poor            Social Drivers of Health   Tobacco Use: Low Risk (12/14/2024)   Patient History    Smoking Tobacco Use: Never    Smokeless Tobacco Use: Never    Passive Exposure: Not on file  Financial Resource Strain: Low Risk (06/15/2024)   Overall Financial Resource Strain (CARDIA)    Difficulty of Paying Living Expenses: Not very hard  Food Insecurity: No Food Insecurity (06/15/2024)   Epic    Worried About Programme Researcher, Broadcasting/film/video in the Last  Year: Never true    Ran Out of Food in the Last Year: Never true  Transportation Needs: No Transportation Needs (06/15/2024)   Epic    Lack of Transportation (Medical): No    Lack of Transportation (Non-Medical): No  Physical Activity: Inactive (06/15/2024)   Exercise Vital Sign    Days of  Exercise per Week: 0 days    Minutes of Exercise per Session: 0 min  Stress: No Stress Concern Present (06/15/2024)   [employer] of Occupational Health - Occupational Stress Questionnaire    Feeling of Stress: Only a little  Social Connections: Socially Isolated (06/15/2024)   Social Connection and Isolation Panel    Frequency of Communication with Friends and Family: More than three times a week    Frequency of Social Gatherings with Friends and Family: More than three times a week    Attends Religious Services: Never    Database Administrator or Organizations: No    Attends Banker Meetings: Never    Marital Status: Divorced  Catering Manager Violence: Not At Risk (06/15/2024)   Epic    Fear of Current or Ex-Partner: No    Emotionally Abused: No    Physically Abused: No    Sexually Abused: No  Depression (PHQ2-9): High Risk (10/16/2024)   Depression (PHQ2-9)    PHQ-2 Score: 12  Alcohol Screen: Low Risk (06/15/2024)   Alcohol Screen    Last Alcohol Screening Score (AUDIT): 0  Housing: Low Risk (06/15/2024)   Epic    Unable to Pay for Housing in the Last Year: No    Number of Times Moved in the Last Year: 0    Homeless in the Last Year: No  Utilities: Not At Risk (06/15/2024)   Epic    Threatened with loss of utilities: No  Health Literacy: Adequate Health Literacy (06/15/2024)   B1300 Health Literacy    Frequency of need for help with medical instructions: Never    Review of Systems:    Constitutional: No weight loss, fever, chills, weakness or fatigue HEENT: Eyes: No change in vision               Ears, Nose, Throat:  No change in hearing or congestion Skin: No rash or  itching Cardiovascular: No chest pain, chest pressure or palpitations   Respiratory: No SOB or cough Gastrointestinal: See HPI and otherwise negative Genitourinary: No dysuria or change in urinary frequency Neurological: No headache, dizziness or syncope Musculoskeletal: No new muscle or joint pain Hematologic: No bleeding or bruising Psychiatric: No history of depression or anxiety    Physical Exam:  Vital signs: There were no vitals taken for this visit.  Constitutional: NAD, alert and cooperative Head:  Normocephalic and atraumatic. Eyes:   PEERL, EOMI. No icterus. Conjunctiva pink. Respiratory: Respirations even and unlabored. Lungs clear to auscultation bilaterally.   No wheezes, crackles, or rhonchi.  Cardiovascular:  Regular rate and rhythm. No peripheral edema, cyanosis or pallor.  Gastrointestinal:  Soft, nondistended, nontender. No rebound or guarding. Normal bowel sounds. No appreciable masses or hepatomegaly. Rectal:  Declines Msk:  Symmetrical without gross deformities. Without edema, no deformity or joint abnormality.  Neurologic:  Alert and  oriented x4;  grossly normal neurologically.  Skin:   Dry and intact without significant lesions or rashes. Psychiatric: Oriented to person, place and time. Demonstrates good judgement and reason without abnormal affect or behaviors.  Physical Exam    RELEVANT LABS AND IMAGING: CBC    Component Value Date/Time   WBC 8.7 06/15/2024 1702   WBC 6.1 03/25/2020 1609   RBC 4.25 06/15/2024 1702   RBC 4.09 03/25/2020 1609   HGB 12.0 06/15/2024 1702   HCT 38.0 06/15/2024 1702   PLT 371 06/15/2024 1702   MCV 89 06/15/2024 1702   MCH 28.2 06/15/2024 1702   MCH  30.8 03/25/2020 1609   MCHC 31.6 06/15/2024 1702   MCHC 31.8 03/25/2020 1609   RDW 13.9 06/15/2024 1702   LYMPHSABS 3.0 09/29/2017 1544   MONOABS 0.6 10/11/2016 0745   EOSABS 0.2 09/29/2017 1544   BASOSABS 0.0 09/29/2017 1544    CMP     Component Value Date/Time    NA 140 10/23/2024 1640   K 4.1 10/23/2024 1640   CL 101 10/23/2024 1640   CO2 25 10/23/2024 1640   GLUCOSE 130 (H) 10/23/2024 1640   GLUCOSE 104 (H) 03/25/2020 1609   BUN 10 10/23/2024 1640   CREATININE 0.56 (L) 10/23/2024 1640   CALCIUM  9.6 10/23/2024 1640   PROT 7.3 10/23/2024 1640   ALBUMIN 4.2 10/23/2024 1640   AST 21 10/23/2024 1640   ALT 22 10/23/2024 1640   ALKPHOS 109 10/23/2024 1640   BILITOT <0.2 10/23/2024 1640   GFRNONAA 105 10/09/2020 1657   GFRAA 121 10/09/2020 1657     Assessment/Plan:   Colon cancer screening No previous colonoscopy  BMI greater than 50   Faiga Stones, PA-C [facility] 01/01/2025, 12:51 PM  Cc: Vicci Barnie NOVAK, MD "

## 2025-01-07 ENCOUNTER — Ambulatory Visit: Admitting: Obstetrics and Gynecology

## 2025-02-14 ENCOUNTER — Ambulatory Visit: Payer: Self-pay | Admitting: Internal Medicine
